# Patient Record
Sex: Female | Born: 1935 | Race: White | Hispanic: No | Marital: Married | Smoking: Never smoker
Health system: Southern US, Community
[De-identification: ages and names within clinical notes are randomized; demographics above are authoritative.]

## PROBLEM LIST (undated history)

## (undated) DIAGNOSIS — E039 Hypothyroidism, unspecified: Secondary | ICD-10-CM

## (undated) DIAGNOSIS — R42 Dizziness and giddiness: Secondary | ICD-10-CM

## (undated) DIAGNOSIS — G4730 Sleep apnea, unspecified: Secondary | ICD-10-CM

## (undated) DIAGNOSIS — M1990 Unspecified osteoarthritis, unspecified site: Secondary | ICD-10-CM

## (undated) DIAGNOSIS — Z923 Personal history of irradiation: Secondary | ICD-10-CM

## (undated) DIAGNOSIS — C50919 Malignant neoplasm of unspecified site of unspecified female breast: Secondary | ICD-10-CM

## (undated) DIAGNOSIS — K219 Gastro-esophageal reflux disease without esophagitis: Secondary | ICD-10-CM

## (undated) DIAGNOSIS — I10 Essential (primary) hypertension: Secondary | ICD-10-CM

## (undated) HISTORY — PX: BREAST EXCISIONAL BIOPSY: SUR124

## (undated) HISTORY — PX: COLONOSCOPY: SHX174

## (undated) HISTORY — PX: APPENDECTOMY: SHX54

## (undated) HISTORY — PX: CARDIAC CATHETERIZATION: SHX172

## (undated) HISTORY — PX: CATARACT EXTRACTION W/ INTRAOCULAR LENS  IMPLANT, BILATERAL: SHX1307

## (undated) HISTORY — PX: TONSILLECTOMY: SUR1361

## (undated) HISTORY — PX: ABDOMINAL HYSTERECTOMY: SHX81

## (undated) HISTORY — PX: JOINT REPLACEMENT: SHX530

---

## 1999-01-13 ENCOUNTER — Emergency Department (HOSPITAL_COMMUNITY): Admission: EM | Admit: 1999-01-13 | Discharge: 1999-01-13 | Payer: Self-pay | Admitting: Emergency Medicine

## 1999-01-13 ENCOUNTER — Encounter: Payer: Self-pay | Admitting: General Surgery

## 1999-01-15 ENCOUNTER — Encounter: Payer: Self-pay | Admitting: General Surgery

## 1999-01-15 ENCOUNTER — Emergency Department (HOSPITAL_COMMUNITY): Admission: EM | Admit: 1999-01-15 | Discharge: 1999-01-15 | Payer: Self-pay | Admitting: Emergency Medicine

## 2006-10-29 ENCOUNTER — Ambulatory Visit: Payer: Self-pay | Admitting: Unknown Physician Specialty

## 2010-01-01 DIAGNOSIS — C50919 Malignant neoplasm of unspecified site of unspecified female breast: Secondary | ICD-10-CM

## 2010-01-01 HISTORY — PX: BREAST LUMPECTOMY: SHX2

## 2010-01-01 HISTORY — PX: BREAST LUMPECTOMY WITH AXILLARY LYMPH NODE BIOPSY: SHX5593

## 2010-01-01 HISTORY — PX: BREAST BIOPSY: SHX20

## 2010-01-01 HISTORY — PX: BREAST SURGERY: SHX581

## 2010-01-01 HISTORY — DX: Malignant neoplasm of unspecified site of unspecified female breast: C50.919

## 2010-12-18 ENCOUNTER — Ambulatory Visit: Payer: Self-pay | Admitting: Radiation Oncology

## 2011-01-05 ENCOUNTER — Ambulatory Visit: Payer: Self-pay | Admitting: Radiation Oncology

## 2011-01-31 LAB — CBC CANCER CENTER
Basophil %: 1.1 %
Eosinophil %: 2.2 %
HGB: 12.9 g/dL (ref 12.0–16.0)
MCHC: 34 g/dL (ref 32.0–36.0)
Monocyte #: 0.6 x10 3/mm (ref 0.0–0.7)
Monocyte %: 7 %
Neutrophil %: 67.7 %
RBC: 4.34 10*6/uL (ref 3.80–5.20)

## 2011-02-02 ENCOUNTER — Ambulatory Visit: Payer: Self-pay | Admitting: Radiation Oncology

## 2011-02-28 LAB — CBC CANCER CENTER
Basophil %: 0.3 %
Eosinophil %: 2 %
MCH: 29.5 pg (ref 26.0–34.0)
MCV: 87 fL (ref 80–100)
Monocyte #: 0.7 x10 3/mm (ref 0.0–0.7)
Neutrophil #: 5.9 x10 3/mm (ref 1.4–6.5)
Neutrophil %: 76.6 %

## 2011-03-02 ENCOUNTER — Ambulatory Visit: Payer: Self-pay | Admitting: Radiation Oncology

## 2011-04-02 ENCOUNTER — Ambulatory Visit: Payer: Self-pay | Admitting: Radiation Oncology

## 2011-05-02 ENCOUNTER — Ambulatory Visit: Payer: Self-pay | Admitting: Radiation Oncology

## 2011-07-17 ENCOUNTER — Ambulatory Visit: Payer: Self-pay | Admitting: Oncology

## 2011-07-17 LAB — COMPREHENSIVE METABOLIC PANEL
Albumin: 3.7 g/dL (ref 3.4–5.0)
Anion Gap: 12 (ref 7–16)
Bilirubin,Total: 0.5 mg/dL (ref 0.2–1.0)
Chloride: 97 mmol/L — ABNORMAL LOW (ref 98–107)
EGFR (African American): >60
Glucose: 96 mg/dL (ref 65–99)
Osmolality: 273 (ref 275–301)
Potassium: 4.1 mmol/L (ref 3.5–5.1)
SGOT(AST): 19 U/L (ref 15–37)
Total Protein: 7.5 g/dL (ref 6.4–8.2)

## 2011-07-17 LAB — CBC CANCER CENTER
Basophil %: 0.5 %
HCT: 39.3 % (ref 35.0–47.0)
HGB: 13.1 g/dL (ref 12.0–16.0)
Lymphocyte #: 1 x10 3/mm (ref 1.0–3.6)
Lymphocyte %: 11.5 %
MCHC: 33.3 g/dL (ref 32.0–36.0)
Monocyte #: 0.9 x10 3/mm (ref 0.2–0.9)
Monocyte %: 9.9 %
Neutrophil %: 76.7 %
RDW: 15.3 % — ABNORMAL HIGH (ref 11.5–14.5)
WBC: 8.8 x10 3/mm (ref 3.6–11.0)

## 2011-08-02 ENCOUNTER — Ambulatory Visit: Payer: Self-pay | Admitting: Oncology

## 2011-09-27 ENCOUNTER — Ambulatory Visit: Payer: Self-pay | Admitting: Oncology

## 2011-10-02 ENCOUNTER — Ambulatory Visit: Payer: Self-pay | Admitting: Oncology

## 2012-01-02 ENCOUNTER — Ambulatory Visit: Payer: Self-pay | Admitting: Oncology

## 2012-01-14 ENCOUNTER — Encounter: Payer: Self-pay | Admitting: Unknown Physician Specialty

## 2012-01-15 LAB — COMPREHENSIVE METABOLIC PANEL
Albumin: 3.8 g/dL (ref 3.4–5.0)
Alkaline Phosphatase: 86 U/L (ref 50–136)
Anion Gap: 11 (ref 7–16)
Bilirubin,Total: 0.5 mg/dL (ref 0.2–1.0)
Calcium, Total: 9 mg/dL (ref 8.5–10.1)
Chloride: 96 mmol/L — ABNORMAL LOW (ref 98–107)
Co2: 25 mmol/L (ref 21–32)
Creatinine: 0.82 mg/dL (ref 0.60–1.30)
Glucose: 74 mg/dL (ref 65–99)
Osmolality: 264 (ref 275–301)
Potassium: 3.8 mmol/L (ref 3.5–5.1)
SGOT(AST): 19 U/L (ref 15–37)
SGPT (ALT): 25 U/L (ref 12–78)
Sodium: 132 mmol/L — ABNORMAL LOW (ref 136–145)

## 2012-01-15 LAB — CBC CANCER CENTER
Basophil %: 0.8 %
Eosinophil %: 2.3 %
HCT: 40.4 % (ref 35.0–47.0)
Lymphocyte #: 1.4 x10 3/mm (ref 1.0–3.6)
MCHC: 33.8 g/dL (ref 32.0–36.0)
Monocyte %: 10.5 %
Neutrophil #: 4.9 x10 3/mm (ref 1.4–6.5)
Platelet: 176 x10 3/mm (ref 150–440)
WBC: 7.4 x10 3/mm (ref 3.6–11.0)

## 2012-02-02 ENCOUNTER — Encounter: Payer: Self-pay | Admitting: Unknown Physician Specialty

## 2012-02-02 ENCOUNTER — Ambulatory Visit: Payer: Self-pay | Admitting: Oncology

## 2012-02-27 ENCOUNTER — Ambulatory Visit: Payer: Self-pay | Admitting: Neurology

## 2012-02-28 ENCOUNTER — Ambulatory Visit: Payer: Self-pay | Admitting: Neurology

## 2012-03-06 ENCOUNTER — Ambulatory Visit: Payer: Self-pay | Admitting: Neurology

## 2012-04-09 ENCOUNTER — Ambulatory Visit: Payer: Self-pay | Admitting: Oncology

## 2012-05-01 ENCOUNTER — Ambulatory Visit: Payer: Self-pay | Admitting: Oncology

## 2012-07-01 ENCOUNTER — Ambulatory Visit: Payer: Self-pay | Admitting: Oncology

## 2012-07-09 ENCOUNTER — Other Ambulatory Visit: Payer: Self-pay | Admitting: Orthopedic Surgery

## 2012-07-14 ENCOUNTER — Encounter (HOSPITAL_COMMUNITY): Payer: Self-pay | Admitting: Respiratory Therapy

## 2012-07-16 ENCOUNTER — Encounter (HOSPITAL_COMMUNITY): Payer: Self-pay

## 2012-07-16 ENCOUNTER — Encounter (HOSPITAL_COMMUNITY)
Admission: RE | Admit: 2012-07-16 | Discharge: 2012-07-16 | Disposition: A | Discharge status: Home / Self Care | Payer: Medicare Other | Source: Ambulatory Visit | Attending: Orthopedic Surgery | Admitting: Orthopedic Surgery

## 2012-07-16 DIAGNOSIS — Z01812 Encounter for preprocedural laboratory examination: Secondary | ICD-10-CM | POA: Insufficient documentation

## 2012-07-16 DIAGNOSIS — Z01810 Encounter for preprocedural cardiovascular examination: Secondary | ICD-10-CM | POA: Insufficient documentation

## 2012-07-16 DIAGNOSIS — Z01818 Encounter for other preprocedural examination: Secondary | ICD-10-CM | POA: Insufficient documentation

## 2012-07-16 DIAGNOSIS — Z01811 Encounter for preprocedural respiratory examination: Secondary | ICD-10-CM | POA: Insufficient documentation

## 2012-07-16 HISTORY — DX: Hypothyroidism, unspecified: E03.9

## 2012-07-16 HISTORY — DX: Sleep apnea, unspecified: G47.30

## 2012-07-16 HISTORY — DX: Essential (primary) hypertension: I10

## 2012-07-16 HISTORY — DX: Gastro-esophageal reflux disease without esophagitis: K21.9

## 2012-07-16 HISTORY — DX: Unspecified osteoarthritis, unspecified site: M19.90

## 2012-07-16 LAB — CBC WITH DIFFERENTIAL/PLATELET
Eosinophils Absolute: 0.2 10*3/uL (ref 0.0–0.7)
Lymphs Abs: 1.5 10*3/uL (ref 0.7–4.0)
MCH: 29.5 pg (ref 26.0–34.0)
Neutro Abs: 4.7 10*3/uL (ref 1.7–7.7)
Neutrophils Relative %: 67 % (ref 43–77)
Platelets: 174 10*3/uL (ref 150–400)
RBC: 4.48 MIL/uL (ref 3.87–5.11)
WBC: 7 10*3/uL (ref 4.0–10.5)

## 2012-07-16 LAB — URINALYSIS, ROUTINE W REFLEX MICROSCOPIC
Bilirubin Urine: NEGATIVE
Glucose, UA: NEGATIVE mg/dL
Ketones, ur: NEGATIVE mg/dL
Nitrite: POSITIVE — AB
pH: 7 (ref 5.0–8.0)

## 2012-07-16 LAB — BASIC METABOLIC PANEL
Calcium: 10 mg/dL (ref 8.4–10.5)
GFR calc Af Amer: 63 mL/min — ABNORMAL LOW (ref >90)
GFR calc non Af Amer: 54 mL/min — ABNORMAL LOW (ref >90)
Sodium: 136 mEq/L (ref 135–145)

## 2012-07-16 LAB — APTT: aPTT: 25 seconds (ref 24–37)

## 2012-07-16 LAB — PROTIME-INR
INR: 1.1 (ref 0.00–1.49)
Prothrombin Time: 14 seconds (ref 11.6–15.2)

## 2012-07-16 LAB — SURGICAL PCR SCREEN
MRSA, PCR: NEGATIVE
Staphylococcus aureus: NEGATIVE

## 2012-07-16 LAB — TYPE AND SCREEN

## 2012-07-16 LAB — URINE MICROSCOPIC-ADD ON

## 2012-07-16 NOTE — Pre-Procedure Instructions (Signed)
Lori Wiggins  07/16/2012   Your procedure is scheduled on: Monday, July 22, 2102  Report to [facility] at  9:00 AM.  Call this number if you have problems the morning of surgery: [phone]   Remember:   Do not eat food or drink liquids after midnight.   Take these medicines the morning of surgery with A SIP OF WATER: levothyroxine (SYNTHROID, LEVOTHROID) 50 MCG tablet   Stop taking Aspirin and herbal medications (Omega-3 Fatty Acids (OMEGA-3 PO), Grape Seed 100 MG CAPS, Coenzyme Q10  (CO  Q-10) 100 MG CAPS  Do not take any NSAIDs ie: Ibuprofen, Advil, Naproxen or any medication containing Aspirin.   Do not wear jewelry, make-up or nail polish.  Do not wear lotions, powders, or perfumes. You may wear deodorant.  Do not shave 48 hours prior to surgery.  Do not bring valuables to the hospital.  [facility] is not responsible for any belongings or valuables.  Contacts, dentures or bridgework may not be worn into surgery.  Leave suitcase in the car. After surgery it may be brought to your room.  For patients admitted to the hospital, checkout time is 11:00 AM the day of discharge.   Patients discharged the day of surgery will not be allowed to drive home.  Name and phone number of your driver:   Special Instructions: Shower using CHG 2 nights before surgery and the night before surgery.  If you shower the day of surgery use CHG.  Use special wash - you have one bottle of CHG for all showers.  You should use approximately 1/3 of the bottle for each shower.   Please read over the following fact sheets that you were given: Pain Booklet, Coughing and Deep Breathing, Blood Transfusion Information, Total Joint Packet, MRSA Information and Surgical Site Infection Prevention

## 2012-07-17 LAB — URINE CULTURE: Colony Count: >=100000

## 2012-07-17 NOTE — Progress Notes (Signed)
Anesthesia Chart Review:  Patient is a 79 year old female scheduled for left TKA on 07/21/12.  History includes non-smoker, HTN, OSA with CPAP use, GERD, hypothyroidism, left breast cancer s/p lumpectomy '12, arthritis.  EKG on 07/16/12 showed NSR, possible LAE. By records, she did have a cardiac cath > 10 years ago, but there is no reported CAD/MI/CHF history.  CXR on 07/16/12 showed: 1. No acute cardiopulmonary disease.  2. Mild interstitial coarsening appears chronic.  Preoperative labs noted.  Urine culture showed > 100,000 colonies of E. Coli.  Urine culture results called to [facility] at Dr. Wadie Lessen office.  Will defer treatment recommendations to Dr. Turner Daniels.  She will be evaluated by her assigned anesthesiologist on the day of surgery, but if no acute changes then I would anticipate that she could proceed as planned.  Velna Ochs [facility]/Anesthesiology Phone [phone] 07/17/2012 3:38 PM

## 2012-07-18 NOTE — Progress Notes (Addendum)
I called the number listed and was told that I have the wrong number.  [phone].  I called again at 1935 at the same number and spoke with the patient.  Instructed her to arrive at 0800 for 1000 surgery.

## 2012-07-18 NOTE — H&P (Signed)
Lori Wiggins is an 78 y.o. female.   Chief Complaint: End Stage arthritis of Left Knee  HPI: Patient is seen in consultation from Dr. Charlett Blake for end-stage arthritis of the left knee.  She has failed conservative treatment with anti-inflammatory medicines, cortisone injections, and most recently a series of Synvisc injections.  She is 79 years old, very healthy and very active.  Over the last 6 months.  The pain is gotten progressively worse.  She must hold onto a shopping cart whenever she shops she cannot walk more than 1 block, the pain does not wake her at night at this time.  Although she has not fallen down.  She is concerned about some instability when she walks.  She denies any prior history of stroke, heart attack, bleeding ulcer or out-of-control diabetes or blood pressure problems.  Past Medical History  Diagnosis Date  . Hypertension   . Hypothyroidism   . Sleep apnea     uses CPAP  . GERD (gastroesophageal reflux disease)   . Arthritis   . Cancer     left  breast    Past Surgical History  Procedure Laterality Date  . Cardiac catheterization      10 years ago  . Breast surgery Left 2012    Lumpectomy  . Tonsillectomy    . Appendectomy    . Abdominal hysterectomy      No family history on file. Social History:  reports that she has never smoked. She has never used smokeless tobacco. She reports that  drinks alcohol. She reports that she does not use illicit drugs.  Allergies:  Allergies  Allergen Reactions  . Nsaids Rash    Pt. does not want to take at all.    No prescriptions prior to admission    No results found for this or any previous visit (from the past 48 hour(s)). No results found.  Review of Systems  Constitutional: Negative.   HENT: Negative.   Eyes: Negative.   Respiratory: Negative.   Cardiovascular: Negative.   Gastrointestinal: Negative.   Genitourinary: Negative.   Musculoskeletal: Positive for joint pain.  Skin: Negative.    Neurological:       Balance problems  Endo/Heme/Allergies: Negative.   Psychiatric/Behavioral: Negative.     There were no vitals taken for this visit. Physical Exam  Constitutional: She is oriented to person, place, and time. She appears well-developed and well-nourished.  HENT:  Head: Normocephalic and atraumatic.  Eyes: EOM are normal. Pupils are equal, round, and reactive to light.  Neck: Normal range of motion. Neck supple.  Cardiovascular: Intact distal pulses.   Respiratory: Effort normal and breath sounds normal.  Musculoskeletal: She exhibits tenderness (Left knee pain).  Neurological: She is alert and oriented to person, place, and time. She has normal reflexes.  Skin: Skin is warm and dry.  Psychiatric: She has a normal mood and affect. Her behavior is normal. Judgment and thought content normal.     Assessment/Plan  Assess: In stage arthritis of the left knee.  Having failed extensive and appropriate conservative measures over the last 6 months.  Plan: Risks and benefits of knee replacement were discussed at length with the patient, models were brought into the room and all questions were answered to the patient and her husband.  At age 22.  She will probably go to a rehabilitation center for a week or 2 after inpatient stay.  She concurs with this plan.  Pt was started on Cipro 500 BID x 7days  prior to surgery for a positive U/A.    Gean Birchwood J 07/18/2012, 11:00 AM

## 2012-07-18 NOTE — Progress Notes (Signed)
Patient needs to come in at 0800 instead of 0900 on Monday morning but no answer or answering machine available. Will attempt to call again later today.

## 2012-07-20 MED ORDER — CEFAZOLIN SODIUM-DEXTROSE 2-3 GM-% IV SOLR
2.0000 g | INTRAVENOUS | Status: AC
  Administered 2012-07-21: 2 g via INTRAVENOUS
  Filled 2012-07-20: qty 50

## 2012-07-21 ENCOUNTER — Encounter (HOSPITAL_COMMUNITY): Payer: Self-pay | Admitting: Vascular Surgery

## 2012-07-21 ENCOUNTER — Encounter (HOSPITAL_COMMUNITY): Payer: Self-pay | Admitting: Anesthesiology

## 2012-07-21 ENCOUNTER — Inpatient Hospital Stay (HOSPITAL_COMMUNITY): Payer: Medicare Other | Admitting: Anesthesiology

## 2012-07-21 ENCOUNTER — Inpatient Hospital Stay (HOSPITAL_COMMUNITY)
Admission: RE | Admit: 2012-07-21 | Discharge: 2012-07-30 | DRG: 470 | Disposition: A | Discharge status: Skilled Nursing Facility | Payer: Medicare Other | Source: Ambulatory Visit | Attending: Orthopedic Surgery | Admitting: Orthopedic Surgery

## 2012-07-21 ENCOUNTER — Encounter (HOSPITAL_COMMUNITY)
Admission: RE | Disposition: A | Discharge status: Skilled Nursing Facility | Payer: Self-pay | Source: Ambulatory Visit | Attending: Orthopedic Surgery

## 2012-07-21 DIAGNOSIS — K9189 Other postprocedural complications and disorders of digestive system: Secondary | ICD-10-CM

## 2012-07-21 DIAGNOSIS — K560 Paralytic ileus: Secondary | ICD-10-CM | POA: Diagnosis not present

## 2012-07-21 DIAGNOSIS — D649 Anemia, unspecified: Secondary | ICD-10-CM | POA: Diagnosis not present

## 2012-07-21 DIAGNOSIS — K929 Disease of digestive system, unspecified: Secondary | ICD-10-CM | POA: Diagnosis not present

## 2012-07-21 DIAGNOSIS — E871 Hypo-osmolality and hyponatremia: Secondary | ICD-10-CM

## 2012-07-21 DIAGNOSIS — A498 Other bacterial infections of unspecified site: Secondary | ICD-10-CM | POA: Diagnosis present

## 2012-07-21 DIAGNOSIS — R339 Retention of urine, unspecified: Secondary | ICD-10-CM | POA: Diagnosis not present

## 2012-07-21 DIAGNOSIS — E039 Hypothyroidism, unspecified: Secondary | ICD-10-CM | POA: Diagnosis present

## 2012-07-21 DIAGNOSIS — K219 Gastro-esophageal reflux disease without esophagitis: Secondary | ICD-10-CM | POA: Diagnosis present

## 2012-07-21 DIAGNOSIS — G4733 Obstructive sleep apnea (adult) (pediatric): Secondary | ICD-10-CM | POA: Diagnosis present

## 2012-07-21 DIAGNOSIS — Z79899 Other long term (current) drug therapy: Secondary | ICD-10-CM

## 2012-07-21 DIAGNOSIS — M1710 Unilateral primary osteoarthritis, unspecified knee: Principal | ICD-10-CM | POA: Diagnosis present

## 2012-07-21 DIAGNOSIS — Z882 Allergy status to sulfonamides status: Secondary | ICD-10-CM

## 2012-07-21 DIAGNOSIS — Z9089 Acquired absence of other organs: Secondary | ICD-10-CM

## 2012-07-21 DIAGNOSIS — M1712 Unilateral primary osteoarthritis, left knee: Secondary | ICD-10-CM | POA: Diagnosis present

## 2012-07-21 DIAGNOSIS — Z7901 Long term (current) use of anticoagulants: Secondary | ICD-10-CM

## 2012-07-21 DIAGNOSIS — Z853 Personal history of malignant neoplasm of breast: Secondary | ICD-10-CM

## 2012-07-21 DIAGNOSIS — Y9210 Unspecified residential institution as the place of occurrence of the external cause: Secondary | ICD-10-CM | POA: Diagnosis not present

## 2012-07-21 DIAGNOSIS — Y831 Surgical operation with implant of artificial internal device as the cause of abnormal reaction of the patient, or of later complication, without mention of misadventure at the time of the procedure: Secondary | ICD-10-CM | POA: Diagnosis not present

## 2012-07-21 DIAGNOSIS — E876 Hypokalemia: Secondary | ICD-10-CM | POA: Diagnosis not present

## 2012-07-21 DIAGNOSIS — K567 Ileus, unspecified: Secondary | ICD-10-CM | POA: Diagnosis not present

## 2012-07-21 DIAGNOSIS — N390 Urinary tract infection, site not specified: Secondary | ICD-10-CM | POA: Diagnosis present

## 2012-07-21 DIAGNOSIS — I10 Essential (primary) hypertension: Secondary | ICD-10-CM | POA: Diagnosis present

## 2012-07-21 DIAGNOSIS — Z886 Allergy status to analgesic agent status: Secondary | ICD-10-CM

## 2012-07-21 DIAGNOSIS — F29 Unspecified psychosis not due to a substance or known physiological condition: Secondary | ICD-10-CM | POA: Diagnosis not present

## 2012-07-21 DIAGNOSIS — B9620 Unspecified Escherichia coli [E. coli] as the cause of diseases classified elsewhere: Secondary | ICD-10-CM

## 2012-07-21 HISTORY — PX: TOTAL KNEE ARTHROPLASTY: SHX125

## 2012-07-21 HISTORY — DX: Malignant neoplasm of unspecified site of unspecified female breast: C50.919

## 2012-07-21 LAB — CBC
HCT: 32.4 % — ABNORMAL LOW (ref 36.0–46.0)
Hemoglobin: 11.3 g/dL — ABNORMAL LOW (ref 12.0–15.0)
MCV: 83.7 fL (ref 78.0–100.0)
RBC: 3.87 MIL/uL (ref 3.87–5.11)
WBC: 14.8 10*3/uL — ABNORMAL HIGH (ref 4.0–10.5)

## 2012-07-21 LAB — CREATININE, SERUM: GFR calc Af Amer: >90 mL/min (ref >90)

## 2012-07-21 SURGERY — ARTHROPLASTY, KNEE, TOTAL
Anesthesia: General | Site: Knee | Laterality: Left | Wound class: Clean

## 2012-07-21 MED ORDER — ONDANSETRON HCL 4 MG/2ML IJ SOLN
4.0000 mg | Freq: Four times a day (QID) | INTRAMUSCULAR | Status: DC | PRN
  Administered 2012-07-24 – 2012-07-26 (×5): 4 mg via INTRAVENOUS
  Filled 2012-07-21 (×5): qty 2

## 2012-07-21 MED ORDER — ALUM & MAG HYDROXIDE-SIMETH 200-200-20 MG/5ML PO SUSP: 30.0000 mL | ORAL | Status: DC | PRN

## 2012-07-21 MED ORDER — ZOLPIDEM TARTRATE 5 MG PO TABS: 5.0000 mg | ORAL_TABLET | Freq: Every evening | ORAL | Status: DC | PRN

## 2012-07-21 MED ORDER — ADULT MULTIVITAMIN W/MINERALS CH
1.0000 | ORAL_TABLET | Freq: Every day | ORAL | Status: DC
  Administered 2012-07-22 – 2012-07-23 (×2): 1 via ORAL
  Filled 2012-07-21 (×3): qty 1

## 2012-07-21 MED ORDER — PROPOFOL 10 MG/ML IV BOLUS
INTRAVENOUS | Status: DC | PRN
  Administered 2012-07-21: 20 mg via INTRAVENOUS
  Administered 2012-07-21: 100 mg via INTRAVENOUS

## 2012-07-21 MED ORDER — IRBESARTAN 150 MG PO TABS
150.0000 mg | ORAL_TABLET | Freq: Every day | ORAL | Status: DC
  Administered 2012-07-22 – 2012-07-24 (×3): 150 mg via ORAL
  Filled 2012-07-21 (×4): qty 1

## 2012-07-21 MED ORDER — ONDANSETRON HCL 4 MG PO TABS: 4.0000 mg | ORAL_TABLET | Freq: Four times a day (QID) | ORAL | Status: DC | PRN

## 2012-07-21 MED ORDER — METOCLOPRAMIDE HCL 10 MG PO TABS: 5.0000 mg | ORAL_TABLET | Freq: Three times a day (TID) | ORAL | Status: DC | PRN

## 2012-07-21 MED ORDER — WARFARIN VIDEO: Freq: Once | Status: DC

## 2012-07-21 MED ORDER — ONDANSETRON HCL 4 MG/2ML IJ SOLN
INTRAMUSCULAR | Status: DC | PRN
  Administered 2012-07-21: 4 mg via INTRAVENOUS

## 2012-07-21 MED ORDER — PHENOL 1.4 % MT LIQD
1.0000 | OROMUCOSAL | Status: DC | PRN
  Administered 2012-07-27: 1 via OROMUCOSAL
  Filled 2012-07-21: qty 177

## 2012-07-21 MED ORDER — FENTANYL CITRATE 0.05 MG/ML IJ SOLN
INTRAMUSCULAR | Status: DC | PRN
Start: 2012-07-21 — End: 2012-07-21
  Administered 2012-07-21 (×2): 50 ug via INTRAVENOUS
  Administered 2012-07-21: 100 ug via INTRAVENOUS
  Administered 2012-07-21: 50 ug via INTRAVENOUS

## 2012-07-21 MED ORDER — SODIUM CHLORIDE 0.9 % IR SOLN
Status: DC | PRN
  Administered 2012-07-21: 3000 mL

## 2012-07-21 MED ORDER — EPHEDRINE SULFATE 50 MG/ML IJ SOLN
INTRAMUSCULAR | Status: DC | PRN
  Administered 2012-07-21: 10 mg via INTRAVENOUS

## 2012-07-21 MED ORDER — KCL IN DEXTROSE-NACL 20-5-0.45 MEQ/L-%-% IV SOLN
INTRAVENOUS | Status: DC
  Administered 2012-07-21: 125 mL/h via INTRAVENOUS
  Administered 2012-07-22 – 2012-07-24 (×5): via INTRAVENOUS
  Filled 2012-07-21 (×20): qty 1000

## 2012-07-21 MED ORDER — DIPHENHYDRAMINE HCL 12.5 MG/5ML PO ELIX: 12.5000 mg | ORAL_SOLUTION | ORAL | Status: DC | PRN

## 2012-07-21 MED ORDER — CHLORHEXIDINE GLUCONATE 4 % EX LIQD: 60.0000 mL | Freq: Once | CUTANEOUS | Status: DC

## 2012-07-21 MED ORDER — SODIUM CHLORIDE 0.9 % IJ SOLN
INTRAMUSCULAR | Status: DC | PRN
  Administered 2012-07-21: 11:00:00

## 2012-07-21 MED ORDER — COUMADIN BOOK
Freq: Once | Status: AC
  Administered 2012-07-21: 15:00:00
  Filled 2012-07-21: qty 1

## 2012-07-21 MED ORDER — BUPIVACAINE HCL (PF) 0.25 % IJ SOLN
INTRAMUSCULAR | Status: AC
  Filled 2012-07-21: qty 30

## 2012-07-21 MED ORDER — METHOCARBAMOL 100 MG/ML IJ SOLN
500.0000 mg | Freq: Four times a day (QID) | INTRAMUSCULAR | Status: DC | PRN
  Filled 2012-07-21: qty 5

## 2012-07-21 MED ORDER — METHOCARBAMOL 500 MG PO TABS
500.0000 mg | ORAL_TABLET | Freq: Four times a day (QID) | ORAL | Status: DC | PRN
  Administered 2012-07-21 – 2012-07-23 (×3): 500 mg via ORAL
  Filled 2012-07-21 (×3): qty 1

## 2012-07-21 MED ORDER — CEFUROXIME SODIUM 1.5 G IJ SOLR
INTRAMUSCULAR | Status: DC | PRN
  Administered 2012-07-21: 1.5 g

## 2012-07-21 MED ORDER — KCL IN DEXTROSE-NACL 20-5-0.45 MEQ/L-%-% IV SOLN
INTRAVENOUS | Status: AC
  Filled 2012-07-21: qty 1000

## 2012-07-21 MED ORDER — METOCLOPRAMIDE HCL 5 MG/ML IJ SOLN
5.0000 mg | Freq: Three times a day (TID) | INTRAMUSCULAR | Status: DC | PRN
  Administered 2012-07-24: 10 mg via INTRAVENOUS
  Filled 2012-07-21: qty 2

## 2012-07-21 MED ORDER — ENOXAPARIN SODIUM 30 MG/0.3ML ~~LOC~~ SOLN
30.0000 mg | Freq: Two times a day (BID) | SUBCUTANEOUS | Status: DC
  Administered 2012-07-22 – 2012-07-23 (×3): 30 mg via SUBCUTANEOUS
  Filled 2012-07-21 (×5): qty 0.3

## 2012-07-21 MED ORDER — DEXTROSE-NACL 5-0.45 % IV SOLN: INTRAVENOUS | Status: DC

## 2012-07-21 MED ORDER — ACETAMINOPHEN 325 MG PO TABS
650.0000 mg | ORAL_TABLET | Freq: Four times a day (QID) | ORAL | Status: DC | PRN
  Administered 2012-07-21: 650 mg via ORAL
  Filled 2012-07-21: qty 2

## 2012-07-21 MED ORDER — LEVOTHYROXINE SODIUM 50 MCG PO TABS
50.0000 ug | ORAL_TABLET | Freq: Every day | ORAL | Status: DC
  Administered 2012-07-22 – 2012-07-24 (×3): 50 ug via ORAL
  Filled 2012-07-21 (×5): qty 1

## 2012-07-21 MED ORDER — SODIUM CHLORIDE 0.9 % IR SOLN
Status: DC | PRN
  Administered 2012-07-21: 1

## 2012-07-21 MED ORDER — LIDOCAINE HCL (CARDIAC) 20 MG/ML IV SOLN
INTRAVENOUS | Status: DC | PRN
  Administered 2012-07-21: 55 mg via INTRAVENOUS

## 2012-07-21 MED ORDER — HYDROCHLOROTHIAZIDE 25 MG PO TABS
25.0000 mg | ORAL_TABLET | Freq: Every day | ORAL | Status: DC
  Administered 2012-07-22 – 2012-07-24 (×3): 25 mg via ORAL
  Filled 2012-07-21 (×4): qty 1

## 2012-07-21 MED ORDER — ACETAMINOPHEN 650 MG RE SUPP: 650.0000 mg | Freq: Four times a day (QID) | RECTAL | Status: DC | PRN

## 2012-07-21 MED ORDER — CIPROFLOXACIN HCL 500 MG PO TABS
500.0000 mg | ORAL_TABLET | Freq: Two times a day (BID) | ORAL | Status: DC
  Administered 2012-07-21 – 2012-07-23 (×4): 500 mg via ORAL
  Filled 2012-07-21 (×5): qty 1

## 2012-07-21 MED ORDER — FENTANYL CITRATE 0.05 MG/ML IJ SOLN
INTRAMUSCULAR | Status: AC
  Filled 2012-07-21: qty 2

## 2012-07-21 MED ORDER — BUPIVACAINE HCL (PF) 0.25 % IJ SOLN
INTRAMUSCULAR | Status: DC | PRN
  Administered 2012-07-21: 30 mL

## 2012-07-21 MED ORDER — ONDANSETRON HCL 4 MG/2ML IJ SOLN: 4.0000 mg | Freq: Once | INTRAMUSCULAR | Status: DC | PRN

## 2012-07-21 MED ORDER — MENTHOL 3 MG MT LOZG: 1.0000 | LOZENGE | OROMUCOSAL | Status: DC | PRN

## 2012-07-21 MED ORDER — WARFARIN SODIUM 4 MG PO TABS
4.0000 mg | ORAL_TABLET | Freq: Every day | ORAL | Status: DC
  Administered 2012-07-21: 4 mg via ORAL
  Filled 2012-07-21 (×2): qty 1

## 2012-07-21 MED ORDER — WARFARIN - PHARMACIST DOSING INPATIENT: Freq: Every day | Status: DC

## 2012-07-21 MED ORDER — VALSARTAN-HYDROCHLOROTHIAZIDE 160-25 MG PO TABS: 1.0000 | ORAL_TABLET | Freq: Every day | ORAL | Status: DC

## 2012-07-21 MED ORDER — OXYCODONE HCL 5 MG PO TABS
5.0000 mg | ORAL_TABLET | ORAL | Status: DC | PRN
  Administered 2012-07-21: 5 mg via ORAL
  Administered 2012-07-21 – 2012-07-22 (×3): 10 mg via ORAL
  Administered 2012-07-22 – 2012-07-23 (×3): 5 mg via ORAL
  Filled 2012-07-21 (×2): qty 1
  Filled 2012-07-21: qty 2
  Filled 2012-07-21: qty 1
  Filled 2012-07-21 (×2): qty 2
  Filled 2012-07-21: qty 1
  Filled 2012-07-21: qty 2

## 2012-07-21 MED ORDER — HYDROMORPHONE HCL PF 1 MG/ML IJ SOLN: 1.0000 mg | INTRAMUSCULAR | Status: DC | PRN

## 2012-07-21 MED ORDER — BUPIVACAINE LIPOSOME 1.3 % IJ SUSP
20.0000 mL | INTRAMUSCULAR | Status: DC
  Filled 2012-07-21: qty 20

## 2012-07-21 MED ORDER — LACTATED RINGERS IV SOLN
INTRAVENOUS | Status: DC | PRN
  Administered 2012-07-21 (×2): via INTRAVENOUS

## 2012-07-21 MED ORDER — METHOCARBAMOL 500 MG PO TABS
ORAL_TABLET | ORAL | Status: AC
  Filled 2012-07-21: qty 27

## 2012-07-21 MED ORDER — FENTANYL CITRATE 0.05 MG/ML IJ SOLN
25.0000 ug | INTRAMUSCULAR | Status: DC | PRN
  Administered 2012-07-21 (×3): 50 ug via INTRAVENOUS

## 2012-07-21 MED ORDER — DOCUSATE SODIUM 100 MG PO CAPS
100.0000 mg | ORAL_CAPSULE | Freq: Two times a day (BID) | ORAL | Status: DC
  Administered 2012-07-21 – 2012-07-24 (×6): 100 mg via ORAL
  Filled 2012-07-21 (×12): qty 1

## 2012-07-21 MED ORDER — CEFUROXIME SODIUM 1.5 G IJ SOLR
INTRAMUSCULAR | Status: AC
  Filled 2012-07-21: qty 1.5

## 2012-07-21 SURGICAL SUPPLY — 56 items
BANDAGE ESMARK 6X9 LF (GAUZE/BANDAGES/DRESSINGS) ×1 IMPLANT
BLADE SAG 18X100X1.27 (BLADE) ×2 IMPLANT
BLADE SAW SGTL 13X75X1.27 (BLADE) ×2 IMPLANT
BLADE SURG ROTATE 9660 (MISCELLANEOUS) IMPLANT
BNDG ELASTIC 6X10 VLCR STRL LF (GAUZE/BANDAGES/DRESSINGS) ×2 IMPLANT
BNDG ESMARK 6X9 LF (GAUZE/BANDAGES/DRESSINGS) ×2
BOWL SMART MIX CTS (DISPOSABLE) ×2 IMPLANT
CAPT RP KNEE ×2 IMPLANT
CEMENT HV SMART SET (Cement) ×4 IMPLANT
CLOTH BEACON ORANGE TIMEOUT ST (SAFETY) ×2 IMPLANT
COVER SURGICAL LIGHT HANDLE (MISCELLANEOUS) ×2 IMPLANT
CUFF TOURNIQUET SINGLE 34IN LL (TOURNIQUET CUFF) ×2 IMPLANT
CUFF TOURNIQUET SINGLE 44IN (TOURNIQUET CUFF) IMPLANT
DRAPE EXTREMITY T 121X128X90 (DRAPE) ×2 IMPLANT
DRAPE U-SHAPE 47X51 STRL (DRAPES) ×2 IMPLANT
DURAPREP 26ML APPLICATOR (WOUND CARE) ×4 IMPLANT
ELECT REM PT RETURN 9FT ADLT (ELECTROSURGICAL) ×2
ELECTRODE REM PT RTRN 9FT ADLT (ELECTROSURGICAL) ×1 IMPLANT
EVACUATOR 1/8 PVC DRAIN (DRAIN) ×2 IMPLANT
GAUZE XEROFORM 1X8 LF (GAUZE/BANDAGES/DRESSINGS) IMPLANT
GAUZE XEROFORM 5X9 LF (GAUZE/BANDAGES/DRESSINGS) ×2 IMPLANT
GLOVE BIO SURGEON STRL SZ7.5 (GLOVE) ×2 IMPLANT
GLOVE BIO SURGEON STRL SZ8.5 (GLOVE) ×2 IMPLANT
GLOVE BIOGEL PI IND STRL 7.0 (GLOVE) ×2 IMPLANT
GLOVE BIOGEL PI IND STRL 8 (GLOVE) ×2 IMPLANT
GLOVE BIOGEL PI INDICATOR 7.0 (GLOVE) ×2
GLOVE BIOGEL PI INDICATOR 8 (GLOVE) ×2
GLOVE SURG SS PI 7.0 STRL IVOR (GLOVE) ×2 IMPLANT
GOWN PREVENTION PLUS XLARGE (GOWN DISPOSABLE) ×2 IMPLANT
GOWN STRL NON-REIN LRG LVL3 (GOWN DISPOSABLE) IMPLANT
GOWN STRL REIN XL XLG (GOWN DISPOSABLE) ×2 IMPLANT
HANDPIECE INTERPULSE COAX TIP (DISPOSABLE) ×1
HOOD PEEL AWAY FACE SHEILD DIS (HOOD) ×4 IMPLANT
KIT BASIN OR (CUSTOM PROCEDURE TRAY) ×2 IMPLANT
KIT ROOM TURNOVER OR (KITS) ×2 IMPLANT
MANIFOLD NEPTUNE II (INSTRUMENTS) ×2 IMPLANT
NS IRRIG 1000ML POUR BTL (IV SOLUTION) ×2 IMPLANT
PACK TOTAL JOINT (CUSTOM PROCEDURE TRAY) ×2 IMPLANT
PAD ARMBOARD 7.5X6 YLW CONV (MISCELLANEOUS) ×4 IMPLANT
PADDING CAST COTTON 6X4 STRL (CAST SUPPLIES) ×2 IMPLANT
SET HNDPC FAN SPRY TIP SCT (DISPOSABLE) ×1 IMPLANT
SPONGE GAUZE 4X4 12PLY (GAUZE/BANDAGES/DRESSINGS) ×2 IMPLANT
STAPLER VISISTAT 35W (STAPLE) ×2 IMPLANT
SUCTION FRAZIER TIP 10 FR DISP (SUCTIONS) ×2 IMPLANT
SUT VIC AB 0 CTX 36 (SUTURE) ×1
SUT VIC AB 0 CTX36XBRD ANTBCTR (SUTURE) ×1 IMPLANT
SUT VIC AB 1 CTX 36 (SUTURE) ×2
SUT VIC AB 1 CTX36XBRD ANBCTR (SUTURE) ×1 IMPLANT
SUT VIC AB 2-0 CT1 27 (SUTURE) ×1
SUT VIC AB 2-0 CT1 TAPERPNT 27 (SUTURE) ×1 IMPLANT
TAPE HY-TAPE 1X5Y PINK NS LF (GAUZE/BANDAGES/DRESSINGS) ×2 IMPLANT
TOWEL OR 17X24 6PK STRL BLUE (TOWEL DISPOSABLE) ×2 IMPLANT
TOWEL OR 17X26 10 PK STRL BLUE (TOWEL DISPOSABLE) ×2 IMPLANT
TRAY FOLEY CATH 16FR SILVER (SET/KITS/TRAYS/PACK) ×2 IMPLANT
TRAY FOLEY CATH 16FRSI W/METER (SET/KITS/TRAYS/PACK) IMPLANT
WATER STERILE IRR 1000ML POUR (IV SOLUTION) ×2 IMPLANT

## 2012-07-21 NOTE — Transfer of Care (Signed)
Immediate Anesthesia Transfer of Care Note  Patient: Lori Wiggins  Procedure(s) Performed: Procedure(s): TOTAL KNEE ARTHROPLASTY (Left)  Patient Location: PACU  Anesthesia Type:General  Level of Consciousness: awake, alert  and oriented  Airway & Oxygen Therapy: Patient Spontanous Breathing and Patient connected to nasal cannula oxygen  Post-op Assessment: Report given to PACU RN, Post -op Vital signs reviewed and stable and Patient moving all extremities X 4  Post vital signs: Reviewed and stable  Complications: No apparent anesthesia complications

## 2012-07-21 NOTE — Interval H&P Note (Signed)
History and Physical Interval Note:  07/21/2012 9:17 AM  Lori Wiggins  has presented today for surgery, with the diagnosis of LEFT KNEE OSTEOARTHRITIS  The various methods of treatment have been discussed with the patient and family. After consideration of risks, benefits and other options for treatment, the patient has consented to  Procedure(s): TOTAL KNEE ARTHROPLASTY (Left) as a surgical intervention .  The patient's history has been reviewed, patient examined, no change in status, stable for surgery.  I have reviewed the patient's chart and labs.  Questions were answered to the patient's satisfaction.     Nestor Lewandowsky

## 2012-07-21 NOTE — Anesthesia Procedure Notes (Signed)
Procedure Name: LMA Insertion Date/Time: 07/21/2012 9:30 AM Performed by: Quentin Ore Pre-anesthesia Checklist: Patient identified, Emergency Drugs available, Suction available, Patient being monitored and Timeout performed Patient Re-evaluated:Patient Re-evaluated prior to inductionOxygen Delivery Method: Circle system utilized Preoxygenation: Pre-oxygenation with 100% oxygen Intubation Type: IV induction Ventilation: Mask ventilation without difficulty LMA: LMA inserted LMA Size: 4.0 Number of attempts: 1 Placement Confirmation: positive ETCO2 and breath sounds checked- equal and bilateral Tube secured with: Tape Dental Injury: Teeth and Oropharynx as per pre-operative assessment

## 2012-07-21 NOTE — Progress Notes (Signed)
Orthopedic Tech Progress Note Patient Details:  Lori Wiggins [birth year] [id number] Applied CPM to LLE.  Applied OHF with trapeze to bed. CPM Left Knee CPM Left Knee: On Left Knee Flexion (Degrees): 60 Left Knee Extension (Degrees): 0   [name] 07/21/2012, 1:41 PM

## 2012-07-21 NOTE — Anesthesia Preprocedure Evaluation (Addendum)
Anesthesia Evaluation  Patient identified by MRN, date of birth, ID band Patient awake    Reviewed: Allergy & Precautions, H&P , NPO status , Patient's Chart, lab work & pertinent test results  Airway Mallampati: II TM Distance: <3 FB Neck ROM: full    Dental  (+) Teeth Intact and Dental Advisory Given   Pulmonary sleep apnea and Continuous Positive Airway Pressure Ventilation ,  breath sounds clear to auscultation        Cardiovascular hypertension, On Medications Rhythm:Regular Rate:Normal     Neuro/Psych    GI/Hepatic GERD-  Medicated and Controlled,  Endo/Other  Hypothyroidism   Renal/GU      Musculoskeletal   Abdominal   Peds  Hematology   Anesthesia Other Findings   Reproductive/Obstetrics                          Anesthesia Physical Anesthesia Plan  ASA: II  Anesthesia Plan: Spinal   Post-op Pain Management:    Induction: Intravenous  Airway Management Planned:   Additional Equipment:   Intra-op Plan:   Post-operative Plan:   Informed Consent: I have reviewed the patients History and Physical, chart, labs and discussed the procedure including the risks, benefits and alternatives for the proposed anesthesia with the patient or authorized representative who has indicated his/her understanding and acceptance.   Dental Advisory Given and Dental advisory given  Plan Discussed with: Anesthesiologist, CRNA and Surgeon  Anesthesia Plan Comments:        Anesthesia Quick Evaluation

## 2012-07-21 NOTE — Op Note (Signed)
PATIENT ID:      Lori Wiggins  MRN:     [id number] DOB/AGE:    [birth year] / 79 y.o.       OPERATIVE REPORT    DATE OF PROCEDURE:  07/21/2012       PREOPERATIVE DIAGNOSIS:   LEFT KNEE OSTEOARTHRITIS      There is no weight on file to calculate BMI.                                                        POSTOPERATIVE DIAGNOSIS:   LEFT KNEE OSTEOARTHRITIS                                                                      PROCEDURE:  Procedure(s): TOTAL KNEE ARTHROPLASTY Using Depuy Sigma RP implants #2.5L Femur, #2.5Tibia, 10mm sigma RP bearing, 35 Patella     SURGEON: Jasdeep Dejarnett J    ASSISTANT:   Shirl Derusha PA-C   (Present and scrubbed throughout the case, critical for assistance with exposure, retraction, instrumentation, and closure.)         ANESTHESIA: GET Exparel  DRAINS: foley, 2 medium hemovac in knee   TOURNIQUET TIME:   COMPLICATIONS:  None     SPECIMENS: None   INDICATIONS FOR PROCEDURE: The patient has  LEFT KNEE OSTEOARTHRITIS, varus deformities, XR shows bone on bone arthritis. Patient has failed all conservative measures including anti-inflammatory medicines, narcotics, attempts at  exercise and weight loss, cortisone injections and viscosupplementation.  Risks and benefits of surgery have been discussed, questions answered.   DESCRIPTION OF PROCEDURE: The patient identified by armband, received  IV antibiotics, in the holding area at [facility]. Patient taken to the operating room, appropriate anesthetic  monitors were attached, and general endotracheal anesthesia induced with  the patient in supine position, Foley catheter was inserted. Tourniquet  applied high to the operative thigh. Lateral post and foot positioner  applied to the table, the lower extremity was then prepped and draped  in usual sterile fashion from the ankle to the tourniquet. Time-out procedure was performed. The limb was wrapped with an Esmarch bandage and the tourniquet  inflated to 350 mmHg. We began the operation by making the anterior midline incision starting at handbreadth above the patella going over the patella 1 cm medial to and  4 cm distal to the tibial tubercle. Small bleeders in the skin and the  subcutaneous tissue identified and cauterized. Transverse retinaculum was incised and reflected medially and a medial parapatellar arthrotomy was accomplished. the patella was everted and theprepatellar fat pad resected. The superficial medial collateral  ligament was then elevated from anterior to posterior along the proximal  flare of the tibia and anterior half of the menisci resected. The knee was hyperflexed exposing bone on bone arthritis. Peripheral and notch osteophytes as well as the cruciate ligaments were then resected. We continued to  work our way around posteriorly along the proximal tibia, and externally  rotated the tibia subluxing it out from underneath the femur. A McHale  retractor was placed through the notch and  a lateral Hohmann retractor  placed, and we then drilled through the proximal tibia in line with the  axis of the tibia followed by an intramedullary guide rod and 2-degree  posterior slope cutting guide. The tibial cutting guide was pinned into place  allowing resection of 4 mm of bone medially and about 8 mm of bone  laterally because of her varus deformity. Satisfied with the tibial resection, we then  entered the distal femur 2 mm anterior to the PCL origin with the  intramedullary guide rod and applied the distal femoral cutting guide  set at 11mm, with 5 degrees of valgus. This was pinned along the  epicondylar axis. At this point, the distal femoral cut was accomplished without difficulty. We then sized for a #2.5L femoral component and pinned the guide in 3 degrees of external rotation.The chamfer cutting guide was pinned into place. The anterior, posterior, and chamfer cuts were accomplished without difficulty followed by   the Sigma RP box cutting guide and the box cut. We also removed posterior osteophytes from the posterior femoral condyles. At this  time, the knee was brought into full extension. We checked our  extension and flexion gaps and found them symmetric at 10mm.  The patella thickness measured at 25 mm. We set the cutting guide at 15 and removed the posterior 10 mm  of the patella, sized for a 35 button and drilled the lollipop. The knee  was then once again hyperflexed exposing the proximal tibia. We sized for a #2.5 tibial base plate, applied the smokestack and the conical reamer followed by the the Delta fin keel punch. We then hammered into place the Sigma RP trial femoral component, inserted a 10-mm trial bearing, trial patellar button, and took the knee through range of motion from 0-130 degrees. No thumb pressure was required for patellar  tracking. At this point, all trial components were removed, a double batch of DePuy HV cement with 1500 mg of Zinacef was mixed and applied to all bony metallic mating surfaces except for the posterior condyles of the femur itself. In order, we  hammered into place the tibial tray and removed excess cement, the femoral component and removed excess cement, a 10-mm Sigma RP bearing  was inserted, and the knee brought to full extension with compression.  The patellar button was clamped into place, and excess cement  removed. While the cement cured the wound was irrigated out with normal saline solution pulse lavage, and medium Hemovac drains were placed from an anterolateral  approach. Ligament stability and patellar tracking were checked and found to be excellent. The parapatellar arthrotomy was closed with  running #1 Vicryl suture. The subcutaneous tissue with 0 and 2-0 undyed  Vicryl suture, and the skin with skin staples. A dressing of Xeroform,  4 x 4, dressing sponges, Webril, and Ace wrap applied. The patient  awakened, extubated, and taken to recovery room  without difficulty.   Aiva Miskell J 07/21/2012, 10:59 AM

## 2012-07-21 NOTE — Progress Notes (Signed)
Patient placed on CPAP set at home settings of 13 cmH2O.  Patient is using home mask and tubing and our machine.  Tolerating well at this time.  RT will continue to monitor.

## 2012-07-21 NOTE — Evaluation (Signed)
Physical Therapy Evaluation Patient Details Name: Lori Wiggins MRN: [id number] DOB: 26-Mar-1928 Today's Date: 07/21/2012 Time: 1932-2006 PT Time Calculation (min): 34 min  PT Assessment / Plan / Recommendation History of Present Illness  Patient is an 79 yo female s/p Lt. TKA.  Clinical Impression  Patient presents with problems listed below.  Will benefit from acute PT to maximize independence prior to discharge. Patient anxious during session today.  Recommend ST-SNF for continued therapy at discharge.    PT Assessment  Patient needs continued PT services    Follow Up Recommendations  SNF    Does the patient have the potential to tolerate intense rehabilitation      Barriers to Discharge        Equipment Recommendations  None recommended by PT    Recommendations for Other Services     Frequency 7X/week    Precautions / Restrictions Precautions Precautions: Fall;Knee Precaution Booklet Issued: Yes (comment) Precaution Comments: Provided patient with education on precautions. Restrictions Weight Bearing Restrictions: Yes LLE Weight Bearing: Weight bearing as tolerated   Pertinent Vitals/Pain       Mobility  Bed Mobility Bed Mobility: Supine to Sit;Sitting - Scoot to Edge of Bed Supine to Sit: 3: Mod assist;With rails;HOB elevated Sitting - Scoot to Edge of Bed: 4: Min assist Details for Bed Mobility Assistance: Verbal cues for technique.  Assist to move LLE off of bed and to raise trunk to sitting position.  Patient sat at EOB x 8 minutes with good balance.   Transfers Transfers: Sit to Stand;Stand to [employer] Transfers Sit to Stand: 4: Min assist;With upper extremity assist;From bed Stand to Sit: 4: Min assist;With upper extremity assist;With armrests;To chair/3-in-1 Stand Pivot Transfers: 3: Mod assist Details for Transfer Assistance: Verbal cues for hand placement.  Assist to rise to standing and for balance.  Patient able to take a few steps to pivot  to chair - required mod assist and encouragement to use UE's on RW to bear weight. Ambulation/Gait Ambulation/Gait Assistance: Not tested (comment)    Exercises Total Joint Exercises Ankle Circles/Pumps: AROM;Both;10 reps;Seated   PT Diagnosis: Difficulty walking;Abnormality of gait;Generalized weakness;Acute pain  PT Problem List: Decreased strength;Decreased range of motion;Decreased activity tolerance;Decreased balance;Decreased mobility;Decreased knowledge of use of DME;Decreased knowledge of precautions;Pain PT Treatment Interventions: DME instruction;Gait training;Functional mobility training;Therapeutic exercise;Patient/family education     PT Goals(Current goals can be found in the care plan section) Acute Rehab PT Goals Patient Stated Goal: To get stronger PT Goal Formulation: With patient Time For Goal Achievement: 08/04/12 Potential to Achieve Goals: Good  Visit Information  Last PT Received On: 07/21/12 Assistance Needed: +1 History of Present Illness: Patient is an 79 yo female s/p Lt. TKA.       Prior Functioning  Home Living Family/patient expects to be discharged to:: Skilled nursing facility Living Arrangements: Spouse/significant other Prior Function Level of Independence: Independent Comments: Per chart, patient with balance issues pta. Communication Communication: No difficulties    Cognition  Cognition Arousal/Alertness: Awake/alert Behavior During Therapy: WFL for tasks assessed/performed Overall Cognitive Status: Within Functional Limits for tasks assessed    Extremity/Trunk Assessment Upper Extremity Assessment Upper Extremity Assessment: Generalized weakness Lower Extremity Assessment Lower Extremity Assessment: LLE deficits/detail LLE Deficits / Details: Decreased strength and ROM due to surgery LLE: Unable to fully assess due to pain   Balance    End of Session PT - End of Session Equipment Utilized During Treatment: Gait  belt;Oxygen Activity Tolerance: Patient limited by pain;Patient limited by fatigue  Patient left: in chair;with call bell/phone within reach;with family/visitor present;with nursing/sitter in room Nurse Communication: Mobility status CPM Left Knee CPM Left Knee: Off (off at 19:35)  GP     Vena Austria 07/21/2012, 8:24 PM Durenda Hurt. Renaldo Fiddler, [facility] Pager [phone]

## 2012-07-21 NOTE — Anesthesia Postprocedure Evaluation (Signed)
  Anesthesia Post-op Note  Patient: Lori Wiggins  Procedure(s) Performed: Procedure(s): TOTAL KNEE ARTHROPLASTY (Left)  Patient Location: PACU  Anesthesia Type:General  Level of Consciousness: awake, alert  and oriented  Airway and Oxygen Therapy: Patient Spontanous Breathing and Patient connected to nasal cannula oxygen  Post-op Pain: mild  Post-op Assessment: Post-op Vital signs reviewed  Post-op Vital Signs: Reviewed  Complications: No apparent anesthesia complications

## 2012-07-21 NOTE — Preoperative (Signed)
Beta Blockers   Reason not to administer Beta Blockers:Not Applicable 

## 2012-07-21 NOTE — Progress Notes (Signed)
ANTICOAGULATION CONSULT NOTE - Initial Consult  Pharmacy Consult for Coumadin Indication: VTE prophylaxis  Allergies  Allergen Reactions  . Sulfa Antibiotics Other (See Comments)    Unknown    . Nsaids Rash    Pt. does not want to take at all.    Patient Measurements: Weight = 60 kg  Baseline INR = 1.10  Medical History: Past Medical History  Diagnosis Date  . Hypertension   . Hypothyroidism   . Sleep apnea     uses CPAP  . GERD (gastroesophageal reflux disease)   . Arthritis   . Cancer     left  breast    Assessment: 79 year old female s/p TKR, baseline INR = 1.10  Goal of Therapy:  INR = 1.8 to 2.5 Monitor platelets by anticoagulation protocol: Yes   Plan:  1) Coumadin 4 mg po daily at 1800 pm 2) Daily INR 3) Begin Coumadin education  Thank you. Okey Regal, PharmD [phone]  07/21/2012,2:46 PM

## 2012-07-21 NOTE — Progress Notes (Signed)
UR COMPLETED  

## 2012-07-22 ENCOUNTER — Encounter (HOSPITAL_COMMUNITY): Payer: Self-pay | Admitting: General Practice

## 2012-07-22 LAB — CBC
HCT: 28.1 % — ABNORMAL LOW (ref 36.0–46.0)
MCH: 29 pg (ref 26.0–34.0)
MCHC: 34.9 g/dL (ref 30.0–36.0)
MCV: 83.1 fL (ref 78.0–100.0)
Platelets: 154 10*3/uL (ref 150–400)
RDW: 14.2 % (ref 11.5–15.5)

## 2012-07-22 LAB — BASIC METABOLIC PANEL
BUN: 9 mg/dL (ref 6–23)
Calcium: 8.5 mg/dL (ref 8.4–10.5)
Creatinine, Ser: 0.65 mg/dL (ref 0.50–1.10)
GFR calc Af Amer: >90 mL/min (ref >90)
GFR calc non Af Amer: 80 mL/min — ABNORMAL LOW (ref >90)

## 2012-07-22 MED ORDER — WARFARIN SODIUM 2.5 MG PO TABS
2.5000 mg | ORAL_TABLET | Freq: Once | ORAL | Status: AC
  Administered 2012-07-22: 2.5 mg via ORAL
  Filled 2012-07-22 (×2): qty 1

## 2012-07-22 NOTE — Evaluation (Signed)
Occupational Therapy Evaluation Patient Details Name: Lori Wiggins MRN: [id number] DOB: 1928/11/29 Today's Date: 07/22/2012 Time: 4098-1191 OT Time Calculation (min): 22 min  OT Assessment / Plan / Recommendation History of present illness Patient is an 79 yo female s/p Lt. TKA.   Clinical Impression   Patient presents with problems listed below. Will benefit from acute OT to maximize independence prior to discharge. Patient anxious during session today. Recommend ST-SNF for continued therapy at discharge.     OT Assessment  Patient needs continued OT Services    Follow Up Recommendations  SNF;Supervision/Assistance - 24 hour    Barriers to Discharge      Equipment Recommendations  Other (comment) (defer to snf)    Recommendations for Other Services    Frequency  Min 2X/week    Precautions / Restrictions Precautions Precautions: Fall;Knee Precaution Booklet Issued: No Precaution Comments: Provided patient with education on precautions. Restrictions Weight Bearing Restrictions: Yes LLE Weight Bearing: Weight bearing as tolerated   Pertinent Vitals/Pain Pain 9-10/10. Repositioned. Nurse notified.     ADL  Eating/Feeding: Independent Where Assessed - Eating/Feeding: Chair Grooming: Set up;Supervision/safety Where Assessed - Grooming: Supported sitting Upper Body Bathing: Set up;Supervision/safety Where Assessed - Upper Body Bathing: Supported sitting Lower Body Bathing: Moderate assistance Where Assessed - Lower Body Bathing: Supported sit to stand Upper Body Dressing: Set up;Supervision/safety Where Assessed - Upper Body Dressing: Supported sitting Lower Body Dressing: Moderate assistance Where Assessed - Lower Body Dressing: Supported sit to Pharmacist, hospital: Performed;Minimal Dentist Method: Sit to Barista: Raised toilet seat with arms (or 3-in-1 over toilet) Toileting - Clothing Manipulation and Hygiene: Minimal  assistance Where Assessed - Engineer, mining and Hygiene: Sit to stand from 3-in-1 or toilet Tub/Shower Transfer Method: Not assessed Equipment Used: Gait belt;Rolling walker Transfers/Ambulation Related to ADLs: Min A for ambulation and transfers. ADL Comments: Pt ambulated in room to 3 in 1. Pt performed hygiene/clothing at min A level-Pt presents with balance deficits. Pt anxious during session. Overall Mod A for LB ADLs-unable to don/doff socks at this time.    OT Diagnosis: Acute pain  OT Problem List: Decreased strength;Decreased range of motion;Decreased activity tolerance;Impaired balance (sitting and/or standing);Decreased knowledge of use of DME or AE;Decreased knowledge of precautions;Pain OT Treatment Interventions: Self-care/ADL training;DME and/or AE instruction;Therapeutic activities;Patient/family education;Balance training   OT Goals(Current goals can be found in the care plan section) Acute Rehab OT Goals Patient Stated Goal: to get better OT Goal Formulation: With patient Time For Goal Achievement: 07/29/12 Potential to Achieve Goals: Good ADL Goals Pt Will Perform Grooming: with supervision;standing Pt Will Perform Lower Body Bathing: with min guard assist;sit to/from stand Pt Will Perform Lower Body Dressing: with min guard assist;sit to/from stand Pt Will Transfer to Toilet: with supervision;ambulating;bedside commode Pt Will Perform Toileting - Clothing Manipulation and hygiene: sit to/from stand;with min guard assist  Visit Information  Last OT Received On: 07/22/12 Assistance Needed: +1 History of Present Illness: Patient is an 79 yo female s/p Lt. TKA.       Prior Functioning     Home Living Family/patient expects to be discharged to:: Skilled nursing facility [facility] for rehab) Living Arrangements: Spouse/significant other Prior Function Level of Independence: Independent Comments: Per chart, patient with balance issues  pta. Communication Communication: No difficulties         Vision/Perception     Cognition  Cognition Arousal/Alertness: Awake/alert Behavior During Therapy: Anxious Overall Cognitive Status: Within Functional Limits for tasks assessed  Extremity/Trunk Assessment Upper Extremity Assessment Upper Extremity Assessment: Overall WFL for tasks assessed     Mobility Bed Mobility Bed Mobility: Not assessed Transfers Transfers: Sit to Stand;Stand to Sit Sit to Stand: 4: Min assist;With upper extremity assist;From chair/3-in-1 Stand to Sit: 4: Min assist;With upper extremity assist;To chair/3-in-1 Details for Transfer Assistance: vc's for hand placement- assist to achieve standing position        Balance     End of Session OT - End of Session Equipment Utilized During Treatment: Gait belt;Rolling walker Activity Tolerance: Patient tolerated treatment well Patient left: in chair;with call bell/phone within reach;with family/visitor present Nurse Communication: Patient requests pain meds CPM Left Knee CPM Left Knee: Off  GO     Earlie Raveling OTR/L [phone] 07/22/2012, 1:49 PM

## 2012-07-22 NOTE — Progress Notes (Signed)
Physical Therapy Treatment Patient Details Name: Lori Wiggins MRN: [id number] DOB: [birth year] Today's Date: 07/22/2012 Time: 1478-2956 PT Time Calculation (min): 28 min  PT Assessment / Plan / Recommendation  History of Present Illness Patient is an 79 yo female s/p Lt. TKA.      PT Comments   Pt making progress toward PT goals with ambulation today. Pt is limited by c/o pain and fatigue. Pt also needs mod encouragement for gait training due to fear of bearing weight through the LLE. Pt will benefit from skilled PT in order to increase her strength, mobility, and functional independence.   Follow Up Recommendations  SNF     Does the patient have the potential to tolerate intense rehabilitation     Barriers to Discharge        Equipment Recommendations  None recommended by PT    Recommendations for Other Services    Frequency 7X/week   Progress towards PT Goals Progress towards PT goals: Progressing toward goals  Plan      Precautions / Restrictions Precautions Precautions: Fall;Knee Restrictions Weight Bearing Restrictions: Yes LLE Weight Bearing: Weight bearing as tolerated   Pertinent Vitals/Pain Pt reported 8/10 pain in LLE (nursing gave pain medication).    Mobility  Bed Mobility Bed Mobility: Supine to Sit;Sitting - Scoot to Edge of Bed Supine to Sit: 4: Min assist;With rails;HOB elevated Sitting - Scoot to Edge of Bed: 4: Min guard Details for Bed Mobility Assistance: Verbal cues for technique. Assist to move LLE off of bed and to raise trunk to sitting position. Transfers Transfers: Sit to Stand;Stand to Sit Sit to Stand: 4: Min assist;With upper extremity assist;From bed Stand to Sit: 4: Min assist;With upper extremity assist;With armrests;To chair/3-in-1 Details for Transfer Assistance: VC for hand placement and sequencing. Physical assist to achieve a standing position. Ambulation/Gait Ambulation/Gait Assistance: 4: Min assist Ambulation Distance (Feet):  5 Feet Assistive device: Rolling walker Ambulation/Gait Assistance Details: VC for RW sequencing and use of UE to help take weight off of LE. mod encouragement to participate in ambulation and bear weight through LLE.  Gait Pattern: Step-to pattern;Decreased step length - right;Decreased stance time - left;Trunk flexed Gait velocity: slow    Exercises Total Joint Exercises Ankle Circles/Pumps: AROM;Both;10 reps;Supine Quad Sets: AROM;Left;10 reps;Supine Heel Slides: AAROM;Left;10 reps;Supine Hip ABduction/ADduction: AAROM;Left;10 reps;Supine      PT Goals (current goals can now be found in the care plan section) Acute Rehab PT Goals Time For Goal Achievement: 08/04/12 Potential to Achieve Goals: Good  Visit Information  Last PT Received On: 07/22/12 Assistance Needed: +1 History of Present Illness: Patient is an 79 yo female s/p Lt. TKA.    Subjective Data      Cognition  Cognition Arousal/Alertness: Awake/alert Behavior During Therapy: WFL for tasks assessed/performed Overall Cognitive Status: Within Functional Limits for tasks assessed    Balance     End of Session PT - End of Session Equipment Utilized During Treatment: Gait belt Activity Tolerance: Patient limited by pain;Patient limited by fatigue Patient left: in chair;with call bell/phone within reach;with nursing/sitter in room;with family/visitor present Nurse Communication: Mobility status   GP     Jolyn Nap, SPTA 07/22/2012, 11:51 AM

## 2012-07-22 NOTE — Progress Notes (Signed)
Physical Therapy Treatment Patient Details Name: Lori Wiggins MRN: [id number] DOB: 1928/12/09 Today's Date: 07/22/2012 Time: 3664-4034 PT Time Calculation (min): 27 min  PT Assessment / Plan / Recommendation  History of Present Illness Patient is an 79 yo female s/p Lt. TKA.      PT Comments   Pt slowly making progress toward PT goals with increased ambulation distance. Pt continues to be fearful and anxious about bearing weight through her LLE. PT will continue to follow.  Follow Up Recommendations  SNF     Does the patient have the potential to tolerate intense rehabilitation     Barriers to Discharge        Equipment Recommendations  None recommended by PT    Recommendations for Other Services    Frequency 7X/week   Progress towards PT Goals Progress towards PT goals: Progressing toward goals  Plan Current plan remains appropriate    Precautions / Restrictions Precautions Precautions: Fall;Knee Precaution Booklet Issued: No Precaution Comments: Provided patient with education on precautions. Restrictions Weight Bearing Restrictions: Yes LLE Weight Bearing: Weight bearing as tolerated   Pertinent Vitals/Pain Pt c/o pain but unable to articulate pain scale, suspect due to pain medications taking affect.    Mobility  Bed Mobility Bed Mobility: Not assessed Supine to Sit: 4: Min assist;With rails;HOB elevated Sitting - Scoot to Edge of Bed: 4: Min guard Details for Bed Mobility Assistance: Verbal cues for technique. Assist to move LLE off of bed and to raise trunk to sitting position. Transfers Transfers: Sit to Stand;Stand to Sit Sit to Stand: 4: Min assist;With upper extremity assist;With armrests;From chair/3-in-1 Stand to Sit: 4: Min assist;With upper extremity assist;With armrests;To chair/3-in-1 Details for Transfer Assistance: VC for hand placement and sequencing. Physical assist to achieve a standing position. Ambulation/Gait Ambulation/Gait Assistance: 4:  Min assist Ambulation Distance (Feet): 10 Feet Assistive device: Rolling walker Ambulation/Gait Assistance Details: VC for RW sequencing and proximity. Cueing and mod encouragement for weight shift onto LLE. Gait Pattern: Step-to pattern;Decreased step length - right;Decreased stance time - left;Trunk flexed Gait velocity: slow Stairs: No Wheelchair Mobility Wheelchair Mobility: No    Exercises Total Joint Exercises Ankle Circles/Pumps: AROM;Both;10 reps;Supine Quad Sets: AROM;Left;10 reps;Seated Heel Slides: AAROM;Left;10 reps;Supine Hip ABduction/ADduction: AAROM;Left;10 reps;Seated Long Arc Quad: AAROM;Left;10 reps;Seated     PT Goals (current goals can now be found in the care plan section) Acute Rehab PT Goals Patient Stated Goal: to get better Time For Goal Achievement: 08/04/12 Potential to Achieve Goals: Good  Visit Information  Last PT Received On: 07/22/12 Assistance Needed: +1 History of Present Illness: Patient is an 79 yo female s/p Lt. TKA.    Subjective Data  Patient Stated Goal: to get better   Cognition  Cognition Arousal/Alertness: Awake/alert Behavior During Therapy: Anxious Overall Cognitive Status: Within Functional Limits for tasks assessed    Balance     End of Session PT - End of Session Equipment Utilized During Treatment: Gait belt Activity Tolerance: Patient limited by pain;Patient limited by fatigue Patient left: in chair;with call bell/phone within reach;with family/visitor present Nurse Communication: Mobility status CPM Left Knee CPM Left Knee: Off   GP     Jolyn Nap, SPTA 07/22/2012, 3:06 PM

## 2012-07-22 NOTE — Progress Notes (Signed)
Seen and agreed 07/22/2012 Robinette, Julia Elizabeth PTA [phone] pager [phone] office    

## 2012-07-22 NOTE — Progress Notes (Addendum)
ANTICOAGULATION CONSULT NOTE   Pharmacy Consult for Coumadin Indication: VTE prophylaxis  Allergies  Allergen Reactions  . Sulfa Antibiotics Other (See Comments)    Unknown    . Nsaids Rash    Pt. does not want to take at all.    Patient Measurements: Weight = 60 kg  Baseline INR = 1.10  Medical History: Past Medical History  Diagnosis Date  . Hypertension   . Hypothyroidism   . Sleep apnea     uses CPAP  . GERD (gastroesophageal reflux disease)   . Arthritis   . Cancer     left  breast    Assessment: 79 year old female s/p TKR, baseline INR = 1.10. INR trending up quickly to 1.4 this morning after initial dose of warfarin. No bleeding issues noted. Noted possible plans to transition to bid aspirin at discharge.  Noted patient continues on cipro with can potentiate INR, will continue to monitor closely.  Goal of Therapy:  INR = 1.8 to 2.5 Monitor platelets by anticoagulation protocol: Yes   Plan:  1) Coumadin 2.5 mg po today at 1800 pm 2) Daily INR 3) Begin Coumadin education  Sheppard Coil PharmD., BCPS Clinical Pharmacist Pager [phone] 07/22/2012 11:02 AM

## 2012-07-22 NOTE — Progress Notes (Addendum)
PATIENT ID: Lori Wiggins  MRN: [id number]  DOB/AGE:  1928/01/10 / 79 y.o.  1 Day Post-Op Procedure(s) (LRB): TOTAL KNEE ARTHROPLASTY (Left)    PROGRESS NOTE Subjective: Patient is alert, oriented, no Nausea, no Vomiting, yes passing gas, no Bowel Movement. Taking PO well. Denies SOB, Chest or Calf Pain. Using Incentive Spirometer, PAS in place. Ambulate WBAT, CPM 0-60. Patient reports pain as moderate  .    Objective: Vital signs in last 24 hours: Filed Vitals:   07/21/12 2057 07/21/12 2340 07/22/12 0204 07/22/12 0517  BP: 147/67  141/68 144/63  Pulse: 85  83 82  Temp: 98.1 F (36.7 C)  98.9 F (37.2 C) 99 F (37.2 C)  TempSrc:      Resp: 16  16 14   SpO2: 98% 98% 98% 99%      Intake/Output from previous day: I/O last 3 completed shifts: In: 1880.4 [P.O.:120; I.V.:1760.4] Out: 1940 [Urine:1475; Drains:415; Blood:50]   Intake/Output this shift:     LABORATORY DATA:  Recent Labs  07/21/12 1524  WBC 14.8*  HGB 11.3*  HCT 32.4*  PLT 155  CREATININE 0.70    Examination: Neurologically intact ABD soft Neurovascular intact Sensation intact distally Intact pulses distally Dorsiflexion/Plantar flexion intact Incision: scant drainage Compartment soft} Blood and plasma separated in drain indicating minimal recent drainage, drain pulled without difficulty.  Assessment:   1 Day Post-Op Procedure(s) (LRB): TOTAL KNEE ARTHROPLASTY (Left) ADDITIONAL DIAGNOSIS:  Hypertension, UTI Preop and Sleep Apnea  Plan: PT/OT WBAT, CPM 5/hrs day until ROM 0-90 degrees, then D/C CPM DVT Prophylaxis:  SCDx72hrs, ASA 325 mg BID x 2 weeks DISCHARGE PLAN: Skilled Nursing Facility/Rehab, [facility] when cleared by PT. DISCHARGE NEEDS: HHPT, HHRN, CPM, Walker and 3-in-1 comode seat     Lori Wiggins J 07/22/2012, 7:44 AM

## 2012-07-23 LAB — CBC
MCH: 29 pg (ref 26.0–34.0)
MCHC: 35.4 g/dL (ref 30.0–36.0)
MCV: 81.9 fL (ref 78.0–100.0)
Platelets: 147 10*3/uL — ABNORMAL LOW (ref 150–400)
RBC: 3.21 MIL/uL — ABNORMAL LOW (ref 3.87–5.11)

## 2012-07-23 MED ORDER — WARFARIN SODIUM 5 MG PO TABS: 5.0000 mg | ORAL_TABLET | Freq: Every day | ORAL | Status: DC

## 2012-07-23 MED ORDER — TIZANIDINE HCL 2 MG PO CAPS: 4.0000 mg | ORAL_CAPSULE | Freq: Three times a day (TID) | ORAL | Status: DC | PRN

## 2012-07-23 MED ORDER — BISACODYL 10 MG RE SUPP
10.0000 mg | Freq: Every day | RECTAL | Status: DC | PRN
  Administered 2012-07-23: 10 mg via RECTAL
  Filled 2012-07-23: qty 1

## 2012-07-23 MED ORDER — FLEET ENEMA 7-19 GM/118ML RE ENEM
1.0000 | ENEMA | Freq: Every day | RECTAL | Status: DC | PRN
  Administered 2012-07-23: 1 via RECTAL
  Filled 2012-07-23: qty 1

## 2012-07-23 MED ORDER — CEPHALEXIN 250 MG PO CAPS
250.0000 mg | ORAL_CAPSULE | Freq: Four times a day (QID) | ORAL | Status: DC
  Administered 2012-07-23 – 2012-07-24 (×3): 250 mg via ORAL
  Filled 2012-07-23 (×7): qty 1

## 2012-07-23 MED ORDER — OXYCODONE-ACETAMINOPHEN 5-325 MG PO TABS: 1.0000 | ORAL_TABLET | ORAL | Status: DC | PRN

## 2012-07-23 NOTE — Progress Notes (Signed)
Orthopedic Tech Progress Note Patient Details:  Lori Wiggins 01-Mar-1928 [id number] Put on cpm at 3:20 pm LLE 0-60 Patient ID: [name], female   DOB: [birth year], 79 y.o.   MRN: [id number]   Jennye Moccasin 07/23/2012, 3:19 PM

## 2012-07-23 NOTE — Progress Notes (Signed)
Placed pt. On CPAP via nasal pillows, home settings of 13.0 cm H20 with 2 lpm O2 bleed in.  Pt. Tolerating well at this time. RN aware.

## 2012-07-23 NOTE — Progress Notes (Signed)
PATIENT ID: Lori Wiggins  MRN: [id number]  DOB/AGE:  [birth year] / 79 y.o.  2 Days Post-Op Procedure(s) (LRB): TOTAL KNEE ARTHROPLASTY (Left)    PROGRESS NOTE Subjective: Patient is alert, oriented, no Nausea, no Vomiting, yes passing gas, no Bowel Movement. Taking PO well.  Pt up and eating. Denies SOB, Chest or Calf Pain. Using Incentive Spirometer, PAS in place. Ambulate WBAT, CPM 0-50. Patient reports pain as moderate  .    Objective: Vital signs in last 24 hours: Filed Vitals:   07/22/12 2000 07/22/12 2237 07/23/12 0000 07/23/12 0545  BP:    127/59  Pulse:    87  Temp:    99 F (37.2 C)  TempSrc:    Oral  Resp: 18  18 16   SpO2: 97% 98% 98% 94%      Intake/Output from previous day: I/O last 3 completed shifts: In: 2810.4 [P.O.:800; I.V.:2010.4] Out: 1465 [Urine:1275; Drains:190]   Intake/Output this shift:     LABORATORY DATA:  Recent Labs  07/21/12 1524 07/22/12 0835 07/23/12 0520  WBC 14.8* 10.4 13.0*  HGB 11.3* 9.8* 9.3*  HCT 32.4* 28.1* 26.3*  PLT 155 154 147*  NA  --  123*  --   K  --  3.7  --   CL  --  90*  --   CO2  --  23  --   BUN  --  9  --   CREATININE 0.70 0.65  --   GLUCOSE  --  164*  --   INR  --  1.45 3.94*  CALCIUM  --  8.5  --     Examination: Neurologically intact ABD soft Neurovascular intact Sensation intact distally Intact pulses distally Dorsiflexion/Plantar flexion intact Incision: scant drainage Compartment soft}  Assessment:   2 Days Post-Op Procedure(s) (LRB): TOTAL KNEE ARTHROPLASTY (Left) ADDITIONAL DIAGNOSIS:  Hypertension and Sleep Apnea  Plan: PT/OT WBAT, CPM 5/hrs day until ROM 0-90 degrees, then D/C CPM DVT Prophylaxis:  SCDx72hrs, ASA 325 mg BID x 2 weeks DISCHARGE PLAN: Skilled Nursing Facility/Rehab DISCHARGE NEEDS: HHPT, HHRN, CPM, Walker and 3-in-1 comode seat     Lori Wiggins J 07/23/2012, 7:56 AM

## 2012-07-23 NOTE — Discharge Summary (Signed)
Patient ID: Aviannah Castoro MRN: [id number] DOB/AGE: [birth year] 79 y.o.  Admit date: 07/21/2012 Discharge date: 07/23/2012  Admission Diagnoses:  Principal Problem:   Arthritis of knee, left   Discharge Diagnoses:  Same  Past Medical History  Diagnosis Date  . Hypertension   . Hypothyroidism   . Sleep apnea     uses CPAP  . GERD (gastroesophageal reflux disease)   . Arthritis   . Cancer     left  breast    Surgeries: Procedure(s): TOTAL KNEE ARTHROPLASTY on 07/21/2012   Consultants:    Discharged Condition: Improved  Hospital Course: Kayte Borchard is an 79 y.o. female who was admitted 07/21/2012 for operative treatment ofArthritis of knee, left. Patient has severe unremitting pain that affects sleep, daily activities, and work/hobbies. After pre-op clearance the patient was taken to the operating room on 07/21/2012 and underwent  Procedure(s): TOTAL KNEE ARTHROPLASTY.    Patient was given perioperative antibiotics: Anti-infectives   Start     Dose/Rate Route Frequency Ordered Stop   07/21/12 2000  ciprofloxacin (CIPRO) tablet 500 mg     500 mg Oral 2 times daily 07/21/12 1432     07/21/12 1019  cefUROXime (ZINACEF) injection  Status:  Discontinued       As needed 07/21/12 1020 07/21/12 1110   07/21/12 0600  ceFAZolin (ANCEF) IVPB 2 g/50 mL premix     2 g 100 mL/hr over 30 Minutes Intravenous On call to O.R. 07/20/12 1403 07/21/12 0935       Patient was given sequential compression devices, early ambulation, and chemoprophylaxis to prevent DVT.  Patient benefited maximally from hospital stay and there were no complications.    Recent vital signs: Patient Vitals for the past 24 hrs:  BP Temp Temp src Pulse Resp SpO2  07/23/12 0545 127/59 mmHg 99 F (37.2 C) Oral 87 16 94 %  07/23/12 0000 - - - - 18 98 %  07/22/12 2237 - - - - - 98 %  07/22/12 2000 - - - - 18 97 %  07/22/12 1400 148/62 mmHg 98.7 F (37.1 C) Oral 88 18 97 %     Recent laboratory studies:   Recent Labs  07/21/12 1524 07/22/12 0835 07/23/12 0520  WBC 14.8* 10.4 13.0*  HGB 11.3* 9.8* 9.3*  HCT 32.4* 28.1* 26.3*  PLT 155 154 147*  NA  --  123*  --   K  --  3.7  --   CL  --  90*  --   CO2  --  23  --   BUN  --  9  --   CREATININE 0.70 0.65  --   GLUCOSE  --  164*  --   INR  --  1.45 3.94*  CALCIUM  --  8.5  --      Discharge Medications:     Medication List         CALCIUM + D + K PO  Take 1 tablet by mouth daily.     ciprofloxacin 500 MG tablet  Commonly known as:  CIPRO  Take 500 mg by mouth 2 (two) times daily.     Co Q-10 100 MG Caps  Take 100 mg by mouth daily.     Grape Seed 100 MG Caps  Take 100 mg by mouth daily.     levothyroxine 50 MCG tablet  Commonly known as:  SYNTHROID, LEVOTHROID  Take 50 mcg by mouth daily before breakfast.     multivitamin with minerals Tabs  Take 1 tablet by mouth daily.     OMEGA-3 PO  Take 360 mg by mouth daily.     oxyCODONE-acetaminophen 5-325 MG per tablet  Commonly known as:  ROXICET  Take 1 tablet by mouth every 4 (four) hours as needed for pain.     tizanidine 2 MG capsule  Commonly known as:  ZANAFLEX  Take 2 capsules (4 mg total) by mouth 3 (three) times daily as needed for muscle spasms.     valsartan-hydrochlorothiazide 160-25 MG per tablet  Commonly known as:  DIOVAN-HCT  Take 1 tablet by mouth daily.     Vitamin D3 5000 UNITS Tabs  Take 5,000 Units by mouth daily.     warfarin 5 MG tablet  Commonly known as:  COUMADIN  Take 1 tablet (5 mg total) by mouth daily.        Diagnostic Studies: Dg Chest 2 View  07/16/2012   *RADIOLOGY REPORT*  Clinical Data: Preop.  Total knee arthroplasty.  CHEST - 2 VIEW  Comparison: None.  Findings: The heart size is within normal limits.  Mild interstitial coarsening appears chronic.  No focal airspace disease is evident.  There is no edema or effusion to suggest failure. Surgical clips are noted in the left breast.  The visualized soft tissues and bony  thorax are unremarkable.  IMPRESSION:  1.  No acute cardiopulmonary disease. 2.  Mild interstitial coarsening appears chronic.   Original Report Authenticated By: Marin Roberts, M.D.    Disposition: Final discharge disposition not confirmed      Discharge Orders   Future Orders Complete By Expires     CPM  As directed     Comments:      Continuous passive motion machine (CPM):      Use the CPM from 0 to 60 for 5 hours per day.      You may increase by 10 degrees per day.  You may break it up into 2 or 3 sessions per day.      Use CPM for 2 weeks or until you are told to stop.    Call MD / Call 911  As directed     Comments:      If you experience chest pain or shortness of breath, CALL 911 and be transported to the hospital emergency room.  If you develope a fever above 101 F, pus (white drainage) or increased drainage or redness at the wound, or calf pain, call your surgeon's office.    Change dressing  As directed     Comments:      Change dressing on POD #5.  You may clean the incision with alcohol prior to redressing.    Constipation Prevention  As directed     Comments:      Drink plenty of fluids.  Prune juice may be helpful.  You may use a stool softener, such as Colace (over the counter) 100 mg twice a day.  Use MiraLax (over the counter) for constipation as needed.    Diet - low sodium heart healthy  As directed     Discharge instructions  As directed     Comments:      Follow up with Dr. Turner Daniels in 2 weeks.    Do not put a pillow under the knee. Place it under the heel.  As directed     Driving restrictions  As directed     Comments:      No driving for 2 weeks  Increase activity slowly as tolerated  As directed     Patient may shower  As directed     Comments:      You may shower without a dressing once there is no drainage.  Do not wash over the wound.  If drainage remains, cover wound with plastic wrap and then shower.       Follow-up Information   Follow up  with Nestor Lewandowsky, MD In 2 weeks.   Contact information:   [address] [place] [address] [phone]        Signed: Nestor Lewandowsky 07/23/2012, 8:09 AM

## 2012-07-23 NOTE — Progress Notes (Signed)
Seen and agreed 07/23/2012 Fredrich Birks PTA [phone] pager [phone] office

## 2012-07-23 NOTE — Progress Notes (Signed)
Physical Therapy Treatment Patient Details Name: Lori Wiggins MRN: [id number] DOB: [birth year] Today's Date: 07/23/2012 Time: 7829-5621 PT Time Calculation (min): 25 min  PT Assessment / Plan / Recommendation  History of Present Illness Patient is an 79 yo female s/p Lt. TKA.   Clinical Impression    PT Comments   Patient progressing a little better this morning. Appear less anxious as session progressed. Continue to recommmend SNF at DC and with current POC  Follow Up Recommendations  SNF     Does the patient have the potential to tolerate intense rehabilitation     Barriers to Discharge        Equipment Recommendations  None recommended by PT    Recommendations for Other Services    Frequency 7X/week   Progress towards PT Goals Progress towards PT goals: Progressing toward goals  Plan Current plan remains appropriate    Precautions / Restrictions Precautions Precautions: Fall;Knee Restrictions Weight Bearing Restrictions: Yes LLE Weight Bearing: Weight bearing as tolerated   Pertinent Vitals/Pain Increased pain. patient repositioned for comfort     Mobility  Bed Mobility Bed Mobility: Not assessed Transfers Sit to Stand: 4: Min assist;With upper extremity assist;With armrests;From chair/3-in-1 Stand to Sit: 4: Min assist;With upper extremity assist;With armrests;To chair/3-in-1 Details for Transfer Assistance: Cues for tecniqye. Patient with heavy posterior lean initially but able to correct with increased time Ambulation/Gait Ambulation/Gait Assistance: 4: Min assist Ambulation Distance (Feet): 20 Feet Assistive device: Rolling walker Ambulation/Gait Assistance Details: Cues for gait sequence. A with RW and for balance with ambulation Gait Pattern: Step-to pattern;Decreased step length - right;Decreased stance time - left;Trunk flexed Gait velocity: slow    Exercises Total Joint Exercises Quad Sets: AROM;Left;10 reps;Seated Heel Slides: AAROM;Left;10  reps;Supine Hip ABduction/ADduction: AAROM;Left;10 reps;Seated Straight Leg Raises: AAROM;Left;10 reps Long Arc Quad: AAROM;Left;10 reps;Seated   PT Diagnosis:    PT Problem List:   PT Treatment Interventions:     PT Goals (current goals can now be found in the care plan section)    Visit Information  Last PT Received On: 07/23/12 Assistance Needed: +1 History of Present Illness: Patient is an 79 yo female s/p Lt. TKA.    Subjective Data      Cognition  Cognition Arousal/Alertness: Awake/alert Behavior During Therapy: WFL for tasks assessed/performed Overall Cognitive Status: Within Functional Limits for tasks assessed    Balance     End of Session PT - End of Session Equipment Utilized During Treatment: Gait belt Activity Tolerance: Patient tolerated treatment well Patient left: in chair;with call bell/phone within reach;with family/visitor present   GP     Fredrich Birks 07/23/2012, 11:29 AM 07/23/2012 Fredrich Birks PTA [phone] pager [phone] office

## 2012-07-23 NOTE — Progress Notes (Signed)
ANTICOAGULATION CONSULT NOTE   Pharmacy Consult for Coumadin Indication: VTE prophylaxis  Allergies  Allergen Reactions  . Sulfa Antibiotics Other (See Comments)    Unknown    . Nsaids Rash    Pt. does not want to take at all.    Patient Measurements: Weight = 60 kg  Baseline INR = 1.10  Medical History: Past Medical History  Diagnosis Date  . Hypertension   . Hypothyroidism   . Sleep apnea     uses CPAP  . GERD (gastroesophageal reflux disease)   . Arthritis   . Cancer     left  breast    Assessment: 79 year old female s/p TKR, baseline INR = 1.10. INR supratherapeutic (3.94) this morning after 2 doses of warfarin. No bleeding issues noted. Noted possible plans to transition to bid aspirin at discharge.   Of note: reviewed patients Urine Cx: Resistant to cipro- paged MD  Noted patient continues on cipro with can potentiate INR, will continue to monitor closely.  Goal of Therapy:  INR = 1.8 to 2.5 Monitor platelets by anticoagulation protocol: Yes   Plan:  1) Hold Coumadin tonight 2) D/c Lovenox 3) Daily INR 4) Coumadin reeducation 5) Follow up MD page on UTI, resistant to cipro  Anabel Bene, PharmD Clinical Pharmacist Resident Pager: [phone]

## 2012-07-24 ENCOUNTER — Inpatient Hospital Stay (HOSPITAL_COMMUNITY): Payer: Medicare Other

## 2012-07-24 ENCOUNTER — Encounter (HOSPITAL_COMMUNITY): Payer: Self-pay | Admitting: Physician Assistant

## 2012-07-24 DIAGNOSIS — K560 Paralytic ileus: Secondary | ICD-10-CM

## 2012-07-24 DIAGNOSIS — K929 Disease of digestive system, unspecified: Secondary | ICD-10-CM

## 2012-07-24 DIAGNOSIS — K567 Ileus, unspecified: Secondary | ICD-10-CM | POA: Diagnosis not present

## 2012-07-24 DIAGNOSIS — E871 Hypo-osmolality and hyponatremia: Secondary | ICD-10-CM

## 2012-07-24 LAB — CBC
MCH: 29.6 pg (ref 26.0–34.0)
MCHC: 36.9 g/dL — ABNORMAL HIGH (ref 30.0–36.0)
MCV: 80.2 fL (ref 78.0–100.0)
Platelets: 178 10*3/uL (ref 150–400)
RBC: 3.28 MIL/uL — ABNORMAL LOW (ref 3.87–5.11)

## 2012-07-24 LAB — TSH: TSH: 0.843 u[IU]/mL (ref 0.350–4.500)

## 2012-07-24 MED ORDER — WARFARIN SODIUM 2 MG PO TABS
2.0000 mg | ORAL_TABLET | Freq: Once | ORAL | Status: AC
  Administered 2012-07-24: 2 mg via ORAL
  Filled 2012-07-24 (×2): qty 1

## 2012-07-24 MED ORDER — PANTOPRAZOLE SODIUM 40 MG IV SOLR
40.0000 mg | INTRAVENOUS | Status: DC
  Administered 2012-07-24 – 2012-07-29 (×6): 40 mg via INTRAVENOUS
  Filled 2012-07-24 (×6): qty 40

## 2012-07-24 MED ORDER — CEFAZOLIN SODIUM 1-5 GM-% IV SOLN
1.0000 g | Freq: Three times a day (TID) | INTRAVENOUS | Status: AC
  Administered 2012-07-24 – 2012-07-26 (×6): 1 g via INTRAVENOUS
  Filled 2012-07-24 (×6): qty 50

## 2012-07-24 MED ORDER — KCL IN DEXTROSE-NACL 20-5-0.9 MEQ/L-%-% IV SOLN
INTRAVENOUS | Status: DC
  Administered 2012-07-24 – 2012-07-25 (×2): via INTRAVENOUS
  Filled 2012-07-24 (×10): qty 1000

## 2012-07-24 NOTE — Clinical Social Work Psychosocial (Signed)
Clinical Social Work Department  BRIEF PSYCHOSOCIAL ASSESSMENT  Patient: Lori Wiggins Number: [id number]  Admit date: 07/21/12 Clinical Social Worker Sabino Niemann, MSW Date/Time: 07/24/2012  Referred by: Physician Date Referred: 07/23/12 Referred for   SNF Placement   Other Referral:  Interview type: Patient and patient's husband Other interview type: PSYCHOSOCIAL DATA  Living Status:Husband Admitted from facility:  Level of care:  Primary support name: Lori Wiggins,Lori Wiggins  Primary support relationship to patient: Husband Degree of support available:  Strong and vested  CURRENT CONCERNS  Current Concerns   Post-Acute Placement   Other Concerns:  SOCIAL WORK ASSESSMENT / PLAN  CSW met with pt and her husband re: PT recommendation for SNF.   Pt lives with her husband  CSW explained placement process and answered questions.   Pt reports [facility]  as her preference    CSW completed FL2 and initiated SNF search.     Assessment/plan status: Information/Referral to [facility]  Other assessment/ plan:  Information/referral to community resources:  SNF   PTAR  PATIENT'S/FAMILY'S RESPONSE TO PLAN OF CARE:  Pt  reports she is agreeable to ST SNF in order to increase strength and independence with mobility prior to returning home  Pt verbalized understanding of placement process and appreciation for CSW assist.   Sabino Niemann, MSW [phone]

## 2012-07-24 NOTE — Clinical Social Work Placement (Addendum)
Clinical Social Work Department  CLINICAL SOCIAL WORK PLACEMENT NOTE  07/24/2012  Patient: Lori Wiggins Account Number: [id number]  Admit date: 07/21/12  Clinical Social Worker: Sabino Niemann MSW Date/time: 07/22/12 11:30 AM  Clinical Social Work is seeking post-discharge placement for this patient at the following level of care: SKILLED NURSING (*CSW will update this form in Epic as items are completed)  07/22/12 Patient/family provided with [facility] Department of Clinical Social Work's list of facilities offering this level of care within the geographic area requested by the patient (or if unable, by the patient's family).  07/22/12 Patient/family informed of their freedom to choose among providers that offer the needed level of care, that participate in Medicare, Medicaid or managed care program needed by the patient, have an available bed and are willing to accept the patient.  07/22/12 Patient/family informed of [facility]' ownership interest in [facility], as well as of the fact that they are under no obligation to receive care at this facility.  PASARR submitted to EDS on 07/22/12 PASARR number received from EDS on 07/22/12 FL2 transmitted to all facilities in geographic area requested by pt/family on  FL2 transmitted to all facilities within larger geographic area on  Patient informed that his/her managed care company has contracts with or will negotiate with certain facilities, including the following:  Patient/family informed of bed offers received:07/22/12 Patient chooses bed at [facility] Physician recommends and patient chooses bed at  Patient to be transferred to on 07/30/12 Patient to be transferred to facility by [facility] The following physician request were entered in Epic:  Additional Comments:

## 2012-07-24 NOTE — Progress Notes (Signed)
Physical Therapy Treatment Patient Details Name: Lori Wiggins MRN: [id number] DOB: 1928/05/28 Today's Date: 07/24/2012 Time: 5621-3086 PT Time Calculation (min): 16 min  PT Assessment / Plan / Recommendation  History of Present Illness Patient is an 79 yo female s/p Lt. TKA.      PT Comments   Patient moving slowly this morning and not feeling well. Patient planning to be seen by GI doctor (for possible ilieus) at some point today. Continue to recommend SNF at discharge and continue with current POC  Follow Up Recommendations  SNF     Does the patient have the potential to tolerate intense rehabilitation     Barriers to Discharge        Equipment Recommendations  None recommended by PT    Recommendations for Other Services    Frequency 7X/week   Progress towards PT Goals Progress towards PT goals: Progressing toward goals  Plan Current plan remains appropriate    Precautions / Restrictions Precautions Precautions: Fall;Knee Restrictions LLE Weight Bearing: Weight bearing as tolerated   Pertinent Vitals/Pain " I just dont feel well"    Mobility  Bed Mobility Supine to Sit: 4: Min assist;With rails;HOB elevated Details for Bed Mobility Assistance: Verbal cues for technique. Assist to move LLE off of bed and to raise trunk to sitting position. Transfers Sit to Stand: 4: Min assist;With upper extremity assist;With armrests;From chair/3-in-1 Stand to Sit: 4: Min assist;With upper extremity assist;With armrests;To chair/3-in-1 Details for Transfer Assistance: Cues for hand placement. Patient continues with posterior lean initially but able to correct with increased time Ambulation/Gait Ambulation/Gait Assistance: 4: Min assist Ambulation Distance (Feet): 10 Feet Assistive device: Rolling walker Ambulation/Gait Assistance Details: Patient agreeable to walk to recliner but no further due to not feeling well.  Gait Pattern: Step-to pattern;Decreased step length -  right;Decreased stance time - left;Trunk flexed    Exercises Total Joint Exercises Quad Sets: AROM;Left;10 reps;Seated Heel Slides: AAROM;Left;10 reps;Supine Hip ABduction/ADduction: AAROM;Left;10 reps;Seated Straight Leg Raises: AAROM;Left;10 reps Long Arc Quad: AAROM;Left;10 reps;Seated   PT Diagnosis:    PT Problem List:   PT Treatment Interventions:     PT Goals (current goals can now be found in the care plan section)    Visit Information  Last PT Received On: 07/24/12 Assistance Needed: +1 History of Present Illness: Patient is an 79 yo female s/p Lt. TKA.    Subjective Data      Cognition  Cognition Arousal/Alertness: Awake/alert Behavior During Therapy: WFL for tasks assessed/performed Overall Cognitive Status: Within Functional Limits for tasks assessed    Balance     End of Session PT - End of Session Equipment Utilized During Treatment: Gait belt Activity Tolerance: Patient tolerated treatment well Patient left: in chair;with call bell/phone within reach;with family/visitor present Nurse Communication: Mobility status   GP     Fredrich Birks 07/24/2012, 9:33 AM  07/24/2012 Fredrich Birks PTA [phone] pager [phone] office

## 2012-07-24 NOTE — Progress Notes (Signed)
PATIENT ID: Lori Wiggins  MRN: [id number]  DOB/AGE:  Feb 28, 1928 / 79 y.o.  3 Days Post-Op Procedure(s) (LRB): TOTAL KNEE ARTHROPLASTY (Left)    PROGRESS NOTE Subjective: Patient is alert, oriented, mild Nausea,  Small amount of Vomiting, yes passing gas, yes a small thready Bowel Movement. Taking PO yesterday, but pt does not have an apitite today. Denies SOB, Chest or Calf Pain. Using Incentive Spirometer, PAS in place. Ambulate WBAT, CPM 0-60 Patient reports pain as severe  Pt also complains of severe abdominal pain .    Objective: Vital signs in last 24 hours: Filed Vitals:   07/23/12 2202 07/23/12 2242 07/24/12 0000 07/24/12 0400  BP: 130/52     Pulse: 82 84    Temp: 98.9 F (37.2 C)     TempSrc: Oral     Resp: 18 18 18 18   SpO2: 96% 98% 98% 97%      Intake/Output from previous day: I/O last 3 completed shifts: In: 2100 [P.O.:600; I.V.:1500] Out: -    Intake/Output this shift:     LABORATORY DATA:  Recent Labs  07/21/12 1524  07/22/12 0835 07/23/12 0520 07/24/12 0450  WBC 14.8*  --  10.4 13.0* 14.4*  HGB 11.3*  --  9.8* 9.3* 9.7*  HCT 32.4*  --  28.1* 26.3* 26.3*  PLT 155  --  154 147* 178  NA  --   --  123*  --   --   K  --   --  3.7  --   --   CL  --   --  90*  --   --   CO2  --   --  23  --   --   BUN  --   --  9  --   --   CREATININE 0.70  --  0.65  --   --   GLUCOSE  --   --  164*  --   --   INR  --   < > 1.45 3.94* 1.44  CALCIUM  --   --  8.5  --   --   < > = values in this interval not displayed.  Examination: Neurologically intact Neurovascular intact Sensation intact distally Intact pulses distally Dorsiflexion/Plantar flexion intact Incision: scant drainage No cellulitis present Compartment soft} Pt has pain with palpation of her abdomen.  Assessment:   3 Days Post-Op Procedure(s) (LRB): TOTAL KNEE ARTHROPLASTY (Left) ADDITIONAL DIAGNOSIS:  Hypertension, UTI Preop and Sleep Apnea Concerns for an ileus with KUB showing gas build up  in small intestines.  Plan: PT/OT WBAT, CPM 5/hrs day until ROM 0-90 degrees, then D/C CPM DVT Prophylaxis:  SCDx72hrs, ASA 325 mg BID x 2 weeks DISCHARGE PLAN: Skilled Nursing Facility/Rehab DISCHARGE NEEDS: HHPT, HHRN, CPM, Walker and 3-in-1 comode seat Consult GI with possible NG tube to decompress ileus     Kanav Kazmierczak R 07/24/2012, 8:45 AM

## 2012-07-24 NOTE — Progress Notes (Signed)
Orthopedic Tech Progress Note Patient Details:  Lori Wiggins 06-14-1928 [id number] On cpm at 8:00 pm LLE 0-60 Patient ID: Lori Wiggins, female   DOB: February 08, 1928, 79 y.o.   MRN: [id number]   Lori Wiggins 07/24/2012, 8:06 PM

## 2012-07-24 NOTE — Progress Notes (Signed)
ANTICOAGULATION CONSULT NOTE   Pharmacy Consult for Coumadin Indication: VTE prophylaxis  Patient Measurements: Weight = 60 kg  Baseline INR = 1.10  Medical History: Past Medical History  Diagnosis Date  . Hypertension   . Hypothyroidism   . Sleep apnea     uses CPAP  . GERD (gastroesophageal reflux disease)   . Arthritis   . Breast cancer     left  breast    Assessment: 79 year old female s/p TKR, baseline INR = 1.10. INR supratherapeutic (3.94) on 7/23 and this morning is back down to 1.4. No reversal agents noted, at this point have to question validity of labs yesterday. No bleeding issues noted, possible bowel obstruction per GI. Noted possible plans to transition to bid aspirin at discharge.   Patient now off cipro and on keflex which has much less effect on INR.  Goal of Therapy:  INR = 1.8 to 2.5 Monitor platelets by anticoagulation protocol: Yes   Plan:  1) Warfarin 2mg  tonight  2) Daily INR 3) Coumadin reeducation completed  Sheppard Coil PharmD., BCPS Clinical Pharmacist Pager [phone] 07/24/2012 1:08 PM

## 2012-07-24 NOTE — Progress Notes (Signed)
Orthopedic Tech Progress Note Patient Details:  Lori Wiggins August 20, 1928 [id number] On cpm at 3:10 LLE 60 Patient ID: [name], female   DOB: December 19, 1928, 79 y.o.   MRN: [id number]   Jennye Moccasin 07/24/2012, 3:09 PM

## 2012-07-24 NOTE — Consult Note (Signed)
[facility] Consult: 10:51 AM 07/24/2012   Referring Provider: Turner Daniels Primary Care Physician:  Dr Lacie Scotts Primary Gastroenterologist:  Dr. Markham Jordan in [facility]  Reason for Consultation:  Bowel obstruction.  HPI: Lori Wiggins is an active 79 y.o. female. Uses CPAP for OSA.   Underwent left TKR on 07/21/12.   Post op she has had small amount of vomitting, small BMs, anorexia, abd bloating. She  Denies severe abd pain.  vomiting most of her pos.  Emesis is brown.  KUB  Today showing SBO at distal ileum.   Growing E coli from 7/16 urine clx, currentl abx is Keflex Surgeries include appendectomy and abdominal hysterectomy   Started Reglan this AM, one dose so far.  Fleets enema yesterday. Twice daily Colace.  Taking up to 25 mg oxycodone daily, used 10 mg yesterday.  Also using Robaxin.  No Fentanyl since 7/21.     Past Medical History  Diagnosis Date  . Hypertension   . Hypothyroidism   . Sleep apnea     uses CPAP  . GERD (gastroesophageal reflux disease)   . Arthritis   . Cancer     left  breast    Past Surgical History  Procedure Laterality Date  . Cardiac catheterization      10 years ago  . Breast surgery Left 2012    Lumpectomy  . Tonsillectomy    . Appendectomy    . Abdominal hysterectomy    . Total knee arthroplasty Right 07/21/2012    Dr Turner Daniels  . Total knee arthroplasty Left 07/21/2012    Procedure: TOTAL KNEE ARTHROPLASTY;  Surgeon: Lori Lewandowsky, MD;  Location: [facility] OR;  Service: Orthopedics;  Laterality: Left;    Prior to Admission medications   Medication Sig Start Date End Date Taking? Authorizing Provider  Calcium-Vitamin D-Vitamin K (CALCIUM + D + K PO) Take 1 tablet by mouth daily.   Yes Historical Provider, MD  Cholecalciferol (VITAMIN D3) 5000 UNITS TABS Take 5,000 Units by mouth daily.   Yes Historical Provider, MD  ciprofloxacin (CIPRO) 500 MG tablet Take 500 mg by mouth 2 (two) times daily.   Yes  Historical Provider, MD  Coenzyme Q10 (CO Q-10) 100 MG CAPS Take 100 mg by mouth daily.   Yes Historical Provider, MD  Grape Seed 100 MG CAPS Take 100 mg by mouth daily.   Yes Historical Provider, MD  levothyroxine (SYNTHROID, LEVOTHROID) 50 MCG tablet Take 50 mcg by mouth daily before breakfast.   Yes Historical Provider, MD  Multiple Vitamin (MULTIVITAMIN WITH MINERALS) TABS Take 1 tablet by mouth daily.   Yes Historical Provider, MD  Omega-3 Fatty Acids (OMEGA-3 PO) Take 360 mg by mouth daily.   Yes Historical Provider, MD  valsartan-hydrochlorothiazide (DIOVAN-HCT) 160-25 MG per tablet Take 1 tablet by mouth daily.   Yes Historical Provider, MD  oxyCODONE-acetaminophen (ROXICET) 5-325 MG per tablet Take 1 tablet by mouth every 4 (four) hours as needed for pain. 07/23/12   Lori Lewandowsky, MD  tizanidine (ZANAFLEX) 2 MG capsule Take 2 capsules (4 mg total) by mouth 3 (three) times daily as needed for muscle spasms. 07/23/12   Lori Lewandowsky, MD  warfarin (COUMADIN) 5 MG tablet Take 1 tablet (5 mg total) by mouth daily. 07/23/12   Lori Lewandowsky, MD    Scheduled Meds: . cephALEXin  250 mg Oral Q6H  . docusate sodium  100 mg Oral BID  . irbesartan  150 mg Oral Daily   And  . hydrochlorothiazide  25 mg Oral Daily  . levothyroxine  50 mcg Oral QAC breakfast  . multivitamin with minerals  1 tablet Oral Daily  . warfarin   Does not apply Once  . Warfarin - Pharmacist Dosing Inpatient   Does not apply q1800   Infusions: . dextrose 5 % and 0.45 % NaCl with KCl 20 mEq/L 125 mL/hr at 07/24/12 0512   PRN Meds: acetaminophen, acetaminophen, alum & mag hydroxide-simeth, bisacodyl, diphenhydrAMINE, HYDROmorphone (DILAUDID) injection, menthol-cetylpyridinium, metoCLOPramide (REGLAN) injection, metoCLOPramide, ondansetron (ZOFRAN) IV, ondansetron, oxyCODONE, phenol, sodium phosphate, zolpidem   Allergies as of 07/03/2012  . (Not on File)    History reviewed. No pertinent family history.  History    Social History  . Marital Status: Married    Spouse Name: N/A    Number of Children: N/A  . Years of Education: N/A   Occupational History  . Not on file.   Social History Main Topics  . Smoking status: Never Smoker   . Smokeless tobacco: Never Used  . Alcohol Use: Yes     Comment: social  . Drug Use: No  . Sexually Active: Not Currently   Other Topics Concern  . Not on file   Social History Narrative  . No narrative on file    REVIEW OF SYSTEMS: Constitutional:  Generally no weakness ENT:  No no nose bleeds CV:  No chest pain or palpitations Pulm:  No cough, no dyspnea GI:  Per HPI GU:  Urinating normal amounts, no  Blood in urine.  Heme:  No anemia PTA.    Transfusions:  None in past Neuro:  No headache, no dizziness. No hx CVA Derm:  No rash or sores.  No itching Endocrine:  Thyroid labs every 4 months:  No problems with these Immunization:  Not queried Travel:  Not queried.    PHYSICAL EXAM: Vital signs in last 24 hours: Temp:  [97.8 F (36.6 C)-98.9 F (37.2 C)] 98.9 F (37.2 C) (07/23 2202) Pulse Rate:  [82-85] 84 (07/23 2242) Resp:  [18] 18 (07/24 0400) BP: (129-130)/(52-77) 130/52 mmHg (07/23 2202) SpO2:  [96 %-98 %] 97 % (07/24 0400)  General: Looks young for age, looks tired but not ill. Head:  No swelling of face  Eyes:  No icterus.  Conjunctiva pale Ears:  Slightly HOH  Nose:  No congestion or bleeding Mouth:  Clear, moist, tongue is mid-line Neck:  No mass, no goiter Lungs:  Clear bil.  Breathing unlabored. Heart: RRR.  No MRG Abdomen:  Tight, slightly protuberant.  No tympanitic BS.  Active BS.  Not tender   Rectal: not done   Musc/Skeltl: post surgical swelling and bruising in left knee and leg Extremities:  Edema in left leg  Neurologic:  Oriented x 3.  Inaccurate recall of events, corrected by family.  No tremor.  Moves all 4 limbs Skin:  No rash.  No sores.  Is pale Tattoos:  none Nodes:  No cervical adenopathy   Psych:   Pleasant, alert cooperative.   Intake/Output from previous day: 07/23 0701 - 07/24 0700 In: 600 [P.O.:600] Out: -  Intake/Output this shift:    LAB RESULTS:  Recent Labs  07/22/12 0835 07/23/12 0520 07/24/12 0450  WBC 10.4 13.0* 14.4*  HGB 9.8* 9.3* 9.7*  HCT 28.1* 26.3* 26.3*  PLT 154 147* 178   BMET Lab Results  Component Value Date   NA 123* 07/22/2012   NA 136 07/16/2012   K 3.7 07/22/2012   K 3.8 07/16/2012  CL 90* 07/22/2012   CL 97 07/16/2012   CO2 23 07/22/2012   CO2 27 07/16/2012   GLUCOSE 164* 07/22/2012   GLUCOSE 87 07/16/2012   BUN 9 07/22/2012   BUN 22 07/16/2012   CREATININE 0.65 07/22/2012   CREATININE 0.70 07/21/2012   CREATININE 0.95 07/16/2012   CALCIUM 8.5 07/22/2012   CALCIUM 10.0 07/16/2012   LFT No results found for this basename: PROT, ALBUMIN, AST, ALT, ALKPHOS, BILITOT, BILIDIR, IBILI,  in the last 72 hours PT/INR Lab Results  Component Value Date   INR 1.44 07/24/2012   INR 3.94* 07/23/2012   INR 1.45 07/22/2012   Hepatitis Panel No results found for this basename: HEPBSAG, HCVAB, HEPAIGM, HEPBIGM,  in the last 72 hours C-Diff No components found with this basename: cdiff    Drugs of Abuse  No results found for this basename: labopia, cocainscrnur, labbenz, amphetmu, thcu, labbarb     RADIOLOGY STUDIES: Dg Abd Portable 1v 07/24/2012    Findings: Multiple dilated loops of small bowel are identified measuring up to 4 cm. There is a paucity of colonic bowel gas identified.  No abnormal mass or abnormal calcifications are noted. No bony abnormality is seen.  IMPRESSION: Small bowel obstruction likely in the distal ileum.  CT may be helpful for further evaluation.   Original Report Authenticated By: Alcide Clever, M.D.    ENDOSCOPIC STUDIES: Colonoscopies by Dr Markham Jordan in [place] last was 8 years ago.  He has told her she does not really need to have repeat study at her age.  She has  Had some type of polyp in past.   IMPRESSION: *  Post op ileus  vs SBO. *  S/p left TKR 721. *  Hypothyroidism *  OSA.  Chronic CPAP *  Post op anemia, normocytic.  *  Post op Coumadin DVT prophylaxis.    PLAN: *  ngt *  Npo *  X ray in AM *  Will add Protonix.  *  TSH has been drawn.  All was d/w Dr Leone Payor.    LOS: 3 days   Jennye Moccasin  07/24/2012, 10:51 AM Pager: [phone]     [facility] Attending  I have also seen and assessed the patient and agree with the above note. Ileus vs. SBO but needed NGtube Better after that TSH ok  Also has hyponatremia   Check BMET AM Changed IVF to D5 NS w/ kcl  Iva Boop, MD, [facility] [phone] (pager) 07/24/2012 7:38 PM

## 2012-07-25 ENCOUNTER — Inpatient Hospital Stay (HOSPITAL_COMMUNITY): Payer: Medicare Other

## 2012-07-25 ENCOUNTER — Encounter (HOSPITAL_COMMUNITY): Payer: Self-pay | Admitting: General Surgery

## 2012-07-25 DIAGNOSIS — K560 Paralytic ileus: Secondary | ICD-10-CM

## 2012-07-25 DIAGNOSIS — R109 Unspecified abdominal pain: Secondary | ICD-10-CM

## 2012-07-25 DIAGNOSIS — R110 Nausea: Secondary | ICD-10-CM

## 2012-07-25 DIAGNOSIS — N390 Urinary tract infection, site not specified: Secondary | ICD-10-CM

## 2012-07-25 DIAGNOSIS — IMO0002 Reserved for concepts with insufficient information to code with codable children: Secondary | ICD-10-CM

## 2012-07-25 DIAGNOSIS — K929 Disease of digestive system, unspecified: Secondary | ICD-10-CM

## 2012-07-25 DIAGNOSIS — K5900 Constipation, unspecified: Secondary | ICD-10-CM

## 2012-07-25 DIAGNOSIS — E871 Hypo-osmolality and hyponatremia: Secondary | ICD-10-CM

## 2012-07-25 DIAGNOSIS — I10 Essential (primary) hypertension: Secondary | ICD-10-CM

## 2012-07-25 LAB — BASIC METABOLIC PANEL
BUN: 12 mg/dL (ref 6–23)
CO2: 21 mEq/L (ref 19–32)
Calcium: 7.9 mg/dL — ABNORMAL LOW (ref 8.4–10.5)
Creatinine, Ser: 0.46 mg/dL — ABNORMAL LOW (ref 0.50–1.10)
Glucose, Bld: 107 mg/dL — ABNORMAL HIGH (ref 70–99)

## 2012-07-25 MED ORDER — WARFARIN SODIUM 3 MG PO TABS
3.0000 mg | ORAL_TABLET | Freq: Once | ORAL | Status: DC
  Filled 2012-07-25: qty 1

## 2012-07-25 MED ORDER — ENOXAPARIN SODIUM 30 MG/0.3ML ~~LOC~~ SOLN
30.0000 mg | Freq: Two times a day (BID) | SUBCUTANEOUS | Status: DC
  Administered 2012-07-25 – 2012-07-30 (×10): 30 mg via SUBCUTANEOUS
  Filled 2012-07-25 (×13): qty 0.3

## 2012-07-25 MED ORDER — LEVOTHYROXINE SODIUM 100 MCG IV SOLR
25.0000 ug | Freq: Every day | INTRAVENOUS | Status: DC
  Administered 2012-07-25 – 2012-07-29 (×5): 25 ug via INTRAVENOUS
  Filled 2012-07-25 (×6): qty 5

## 2012-07-25 MED ORDER — SODIUM CHLORIDE 0.9 % IV SOLN
INTRAVENOUS | Status: DC
  Administered 2012-07-25 (×3): via INTRAVENOUS

## 2012-07-25 NOTE — Progress Notes (Signed)
Pt. Refused cpap due to having the NG tube. Pt. States she will resume cpap once her tube is removed.

## 2012-07-25 NOTE — Progress Notes (Signed)
PT Cancellation Note  Patient Details Name: Lori Wiggins MRN: [id number] DOB: 12-Dec-1928   Cancelled Treatment:    Reason Eval/Treat Not Completed: Medical issues which prohibited therapy.  RN requesting to hold therapy due to low Na level & confusion.     [name] 07/25/2012, 10:06 AM   [name], PTA [phone] 07/25/2012

## 2012-07-25 NOTE — Progress Notes (Signed)
Lab called with a  Low Na level of 114. Left a message for Minerva Areola PA for Dr Turner Daniels. Spoke to Dr Turner Daniels, new order to change fluids to NS at 125cc and hospitalists  Will be consulted.

## 2012-07-25 NOTE — Consult Note (Signed)
I have seen and examined the patient and agree with the assessment and plans. I still suspect this is ileus, but if it persists, will get a CT scan to make sure nothing else other than adhesions is causing this.  Niki Payment A. Magnus Ivan  MD, FACS

## 2012-07-25 NOTE — Progress Notes (Signed)
ANTICOAGULATION CONSULT NOTE   Pharmacy Consult for Coumadin Indication: VTE prophylaxis  Patient Measurements: Weight: 60 kg  Baseline INR = 1.10  Medical History: Past Medical History  Diagnosis Date  . Hypertension   . Hypothyroidism   . Sleep apnea     uses CPAP  . GERD (gastroesophageal reflux disease)   . Arthritis   . Breast cancer     left  breast    Assessment: 79 year old female s/p TKR, baseline INR = 1.10. INR subratherapeutic at 1.52 (was 3.94 on 7/23 with no reversal agent, likely lab error). No bleeding issues noted, SBO vs ileus noted. Noted possible plans to transition to bid aspirin at discharge.   Goal of Therapy:  INR 1.8 to 2.5 Monitor platelets by anticoagulation protocol: Yes   Plan:  1) Warfarin 3 mg PO tonight  2) Daily INR 3) Monitor for signs/symptoms of bleeding  [facility], [address].D., BCPS Clinical Pharmacist Pager: [phone] 07/25/2012 12:02 PM

## 2012-07-25 NOTE — Progress Notes (Signed)
PATIENT ID: Lori Wiggins  MRN: [id number]  DOB/AGE:  77/05/1928 / 77 y.o.  4 Days Post-Op Procedure(s) (LRB): TOTAL KNEE ARTHROPLASTY (Left)    PROGRESS NOTE Subjective: Patient is alert, oriented, no Nausea, no Vomiting, yes passing gas, no Bowel Movement. Not Taking PO. Denies SOB, Chest or Calf Pain. Using Incentive Spirometer, PAS in place. Ambulate wbat, CPM 0-60. Patient reports pain as moderate  .    Objective: Vital signs in last 24 hours: Filed Vitals:   07/24/12 0400 07/24/12 1248 07/24/12 2117 07/25/12 0557  BP:  134/66 136/63 155/67  Pulse:  79 77 79  Temp:  97.7 F (36.5 C) 98.2 F (36.8 C) 97.8 F (36.6 C)  TempSrc:  Oral    Resp: 18 16 18 18   SpO2: 97% 98% 95% 96%      Intake/Output from previous day: I/O last 3 completed shifts: In: 1500 [I.V.:1500] Out: 1000 [Urine:700; Emesis/NG output:300]   Intake/Output this shift:     LABORATORY DATA:  Recent Labs  07/22/12 0835 07/23/12 0520 07/24/12 0450  WBC 10.4 13.0* 14.4*  HGB 9.8* 9.3* 9.7*  HCT 28.1* 26.3* 26.3*  PLT 154 147* 178  NA 123*  --   --   K 3.7  --   --   CL 90*  --   --   CO2 23  --   --   BUN 9  --   --   CREATININE 0.65  --   --   GLUCOSE 164*  --   --   INR 1.45 3.94* 1.44  CALCIUM 8.5  --   --     Examination: Neurologically intact Neurovascular intact Sensation intact distally Intact pulses distally Dorsiflexion/Plantar flexion intact Incision: scant drainage Compartment soft Patients abdomen is distended}  Assessment:   4 Days Post-Op Procedure(s) (LRB): TOTAL KNEE ARTHROPLASTY (Left) ADDITIONAL DIAGNOSIS:  Ileus, Hypertension, UTI Preop and Sleep Apnea Post of Small Bowel Obstruction- followed by [facility]  Plan: PT/OT WBAT, CPM 5/hrs day until ROM 0-90 degrees, then D/C CPM DVT Prophylaxis:  SCDx72hrs, ASA 325 mg BID x 2 weeks DISCHARGE PLAN: Skilled Nursing Facility/Rehab once cleared by PT and GI DISCHARGE NEEDS: HHPT, HHRN, CPM, Walker and 3-in-1 comode  seat Pt is being followed by GI.  Awaiting KUB this AM.  Continue with GI plan of NG tube and NPO.      Lori Wiggins 07/25/2012, 7:21 AM

## 2012-07-25 NOTE — Progress Notes (Signed)
Physical Therapy Treatment Patient Details Name: Lori Wiggins MRN: [id number] DOB: 06/21/1928 Today's Date: 07/25/2012 Time: 5643-3295 PT Time Calculation (min): 27 min  PT Assessment / Plan / Recommendation       Clinical Impression Pt making progress with mobility this afternoon.  Able to increase ambulation distance.  Slightly confused although able to answer questions appropriately.        Follow Up Recommendations  SNF     Does the patient have the potential to tolerate intense rehabilitation     Barriers to Discharge        Equipment Recommendations  None recommended by PT    Recommendations for Other Services    Frequency 7X/week   Progress towards PT Goals Progress towards PT goals: Progressing toward goals  Plan Current plan remains appropriate    Precautions / Restrictions Precautions Precautions: Fall;Knee Restrictions LLE Weight Bearing: Weight bearing as tolerated       Mobility  Bed Mobility Bed Mobility: Supine to Sit;Sitting - Scoot to Edge of Bed;Sit to Supine Supine to Sit: 4: Min assist;HOB elevated;With rails Sitting - Scoot to Edge of Bed: 4: Min guard Sit to Supine: 4: Min assist;HOB flat Details for Bed Mobility Assistance: (A) for LLE, to lift shoulders/trunk to sitting upright, & to lift LE's back into bed.  Transfers Transfers: Sit to Stand;Stand to Sit Sit to Stand: 4: Min assist;With upper extremity assist;From bed Stand to Sit: 4: Min guard;With upper extremity assist;To bed Details for Transfer Assistance: cues for hand placement & safe technique.  (A) to achieve standing & balance.  Ambulation/Gait Ambulation/Gait Assistance: 4: Min guard Ambulation Distance (Feet): 40 Feet Assistive device: Rolling walker Ambulation/Gait Assistance Details: Cues for sequencing, body positioning inside RW.   Gait Pattern: Step-through pattern;Decreased stride length;Decreased hip/knee flexion - left;Decreased weight shift to left Stairs:  No Wheelchair Mobility Wheelchair Mobility: No    Exercises Total Joint Exercises Ankle Circles/Pumps: AROM;Both;15 reps Quad Sets: AROM;Both;10 reps Hip ABduction/ADduction: AAROM;Strengthening;Left;10 reps Straight Leg Raises: AAROM;Strengthening;Left;10 reps Goniometric ROM: 70 degrees flexion AAROM   PT Goals (current goals can now be found in the care plan section) Acute Rehab PT Goals PT Goal Formulation: With patient Time For Goal Achievement: 08/04/12 Potential to Achieve Goals: Good  Visit Information  Last PT Received On: 07/25/12 Assistance Needed: +1    Subjective Data      Cognition  Cognition Arousal/Alertness: Awake/alert Behavior During Therapy: WFL for tasks assessed/performed Overall Cognitive Status: Within Functional Limits for tasks assessed    Balance     End of Session PT - End of Session Equipment Utilized During Treatment: Gait belt Activity Tolerance: Patient tolerated treatment well Patient left: in bed;in CPM;with call bell/phone within reach Nurse Communication: Mobility status   GP     [name] 07/25/2012, 2:06 PM   [name], PTA [phone] 07/25/2012

## 2012-07-25 NOTE — Progress Notes (Signed)
[facility] Daily Rounding Note 07/25/2012, 8:46 AM  SUBJECTIVE:       400 cc output so far since NGT placed.  No abdominal pain.  No stools. Confused this AM with Na level down to 114.  Had no urine out put after 8 PM, then had 700 cc of urine on I/O cath but foley not placed.    OBJECTIVE:         Vital signs in last 24 hours:    Temp:  [97.7 F (36.5 C)-98.2 F (36.8 C)] 97.8 F (36.6 C) (07/25 0557) Pulse Rate:  [77-79] 79 (07/25 0557) Resp:  [16-18] 18 (07/25 0800) BP: (134-155)/(63-67) 155/67 mmHg (07/25 0557) SpO2:  [95 %-98 %] 96 % (07/25 0557) Last BM Date: 07/19/12 General: relaxed, comfortable   Heart: RRR Chest: diminished BS in right base Abdomen: tight, slightly distended, hypoactive BS  Extremities: swelling at left knee c/w surgery Neuro/Psych:  Difficulty following commands but does eventually follow commands with max prodding.  Not somnolent.  She is alert but speaking gibberish.    Intake/Output from previous day: 07/24 0701 - 07/25 0700 In: 3000 [I.V.:3000] Out: 1000 [Urine:700; Emesis/NG output:300]  Intake/Output this shift:   Scheduled Meds: .  ceFAZolin (ANCEF) IV  1 g Intravenous Q8H  . docusate sodium  100 mg Oral BID  . levothyroxine  50 mcg Oral QAC breakfast  . pantoprazole (PROTONIX) IV  40 mg Intravenous Q24H  . warfarin   Does not apply Once  . Warfarin - Pharmacist Dosing Inpatient   Does not apply q1800   Continuous Infusions: . sodium chloride 125 mL/hr at 07/25/12 0826  . dextrose 5 % and 0.9 % NaCl with KCl 20 mEq/L 125 mL/hr at 07/25/12 0600   PRN Meds:.acetaminophen, acetaminophen, alum & mag hydroxide-simeth, bisacodyl, diphenhydrAMINE, HYDROmorphone (DILAUDID) injection, menthol-cetylpyridinium, ondansetron (ZOFRAN) IV, ondansetron, oxyCODONE, phenol, sodium phosphate, zolpidem  Lab Results:  Recent Labs  07/23/12 0520 07/24/12 0450  WBC 13.0* 14.4*  HGB 9.3* 9.7*  HCT 26.3* 26.3*  PLT 147* 178   BMET  Recent  Labs  07/25/12 0645  NA 114*  K 3.8  CL 83*  CO2 21  GLUCOSE 148*  BUN 12  CREATININE 0.54  CALCIUM 8.1*    PT/INR  Recent Labs  07/24/12 0450 07/25/12 0645  LABPROT 17.2* 17.9*  INR 1.44 1.52*    Studies/Results: Dg Abd Portable 1v 07/25/2012   *RADIOLOGY REPORT*  Clinical Data: Abdominal pain, small bowel obstruction  PORTABLE ABDOMEN - 1 VIEW  Comparison: July 24, 2012.  Findings: There is continued presence of dilated small bowel loops seen which is not significantly changed compared to prior exam.  No colonic dilatation is noted.  Stool and gas are noted in the rectum. Nasogastric tube tip is seen in the expected position of the proximal stomach.  No abnormal calcifications are noted.  IMPRESSION: No significant change in dilated small bowel loops noted on prior exam, most consistent with distal small bowel obstruction. Nasogastric tube tip seen in proximal stomach.   Original Report Authenticated By: Lupita Raider.,  M.D.   ASSESMENT: *  SBO.  Persists.  NGT in place *  Hyponatremia. Confusion.  Hospitalist to see.  NS in place.   *  Warfarin DVT prophylaxis post TKR   PLAN: *  Will ask surgeon to evaluate.  I called consult.  *  Place foley.  I ordered.  *  Hospitalist to see and manage medically. *  Ok to  give narcotics prn for pain *  Should we hold Coumadin?  Meds will need to be converted to IV formulations.    LOS: 4 days   Jennye Moccasin  07/25/2012, 8:46 AM Pager: [phone]  Events.results noted. Patient seen and examined, assessed.  [facility] will order CT if she fails to progress. IM Dx/Tx low Na.  Will defer to surgery at this point re: management and follow loosely.  Dr. Loreta Ave on call this weekend  Iva Boop, MD, [facility] [phone] (pager) 07/25/2012 3:42 PM

## 2012-07-25 NOTE — Consult Note (Signed)
Reason for Consult:possible small bowel obstruction Referring Physician: Dr. Hendricks Milo  HPI: Lori Wiggins is a 79 year old female with a history of hypothyroidism, hypertension, OSA, GERD, breast cancer, abdominal hysterectomy, appendectomy and arthritis.  She underwent a left knee arthroplasty on 07/21/12  Post operatively her diet was advanced, she was passing gas.  On POD #3 she developed some mild nausea.  Her husband states that she had a small bowel movement (incontinent) 2 days ago and none since.  She also complained of abdominal pain.  A KUB was obtained and showed a small bowel obstruction in the distal ileum, recommend CT to further evaluation.  She had an NGT placed and was made NPO.  She has had out.  She denies abdominal pain.  Her daughter reports that the patient develops nausea with ice chips. Reports passing gas, but no BM yet.  She is not taking much narcotics, last dose was on 23rd.  She has been ambulating with physical therapy, but moving slowly.    The patient was confused earlier today.  Mr. Gwynne states that this is new as of this morning.  She had a foley placed last night due to urinary retention.  Her sodium this morning was 114.  Hospitalist has been consulted to evaluate the patient.    At present time, she does not appear confused.  She was alert and oriented to person place and time during my exam.  She was answering questions appropriately.  Her daughter was at bedside.  Past Medical History  Diagnosis Date  . Hypertension   . Hypothyroidism   . Sleep apnea     uses CPAP  . GERD (gastroesophageal reflux disease)   . Arthritis   . Breast cancer     left  breast    Past Surgical History  Procedure Laterality Date  . Cardiac catheterization      10 years ago  . Breast surgery Left 2012    Lumpectomy  . Tonsillectomy    . Appendectomy    . Abdominal hysterectomy    . Total knee arthroplasty Right 07/21/2012    Dr Turner Daniels  . Total knee arthroplasty Left  07/21/2012    Procedure: TOTAL KNEE ARTHROPLASTY;  Surgeon: Nestor Lewandowsky, MD;  Location: [facility] OR;  Service: Orthopedics;  Laterality: Left;    History reviewed. No pertinent family history.  Social History:  reports that she has never smoked. She has never used smokeless tobacco. She reports that  drinks alcohol. She reports that she does not use illicit drugs.  Allergies:  Allergies  Allergen Reactions  . Sulfa Antibiotics Other (See Comments)    Unknown    . Nsaids Rash    Pt. does not want to take at all.    Medications: {medication reviewed  Results for orders placed during the hospital encounter of 07/21/12 (from the past 48 hour(s))  CBC     Status: Abnormal   Collection Time    07/24/12  4:50 AM      Result Value Range   WBC 14.4 (*) 4.0 - 10.5 K/uL   RBC 3.28 (*) 3.87 - 5.11 MIL/uL   Hemoglobin 9.7 (*) 12.0 - 15.0 g/dL   HCT 45.4 (*) 09.8 - 11.9 %   MCV 80.2  78.0 - 100.0 fL   MCH 29.6  26.0 - 34.0 pg   MCHC 36.9 (*) 30.0 - 36.0 g/dL   RDW 14.7  82.9 - 56.2 %   Platelets 178  150 - 400  K/uL  PROTIME-INR     Status: Abnormal   Collection Time    07/24/12  4:50 AM      Result Value Range   Prothrombin Time 17.2 (*) 11.6 - 15.2 seconds   INR 1.44  0.00 - 1.49  TSH     Status: None   Collection Time    07/24/12 12:00 PM      Result Value Range   TSH 0.843  0.350 - 4.500 uIU/mL  PROTIME-INR     Status: Abnormal   Collection Time    07/25/12  6:45 AM      Result Value Range   Prothrombin Time 17.9 (*) 11.6 - 15.2 seconds   INR 1.52 (*) 0.00 - 1.49  BASIC METABOLIC PANEL     Status: Abnormal   Collection Time    07/25/12  6:45 AM      Result Value Range   Sodium 114 (*) 135 - 145 mEq/L   Comment: CRITICAL RESULT CALLED TO, READ BACK BY AND VERIFIED WITH:     K.CORUM,RN 07/25/12 07527 BY BSLADE   Potassium 3.8  3.5 - 5.1 mEq/L   Chloride 83 (*) 96 - 112 mEq/L   CO2 21  19 - 32 mEq/L   Glucose, Bld 148 (*) 70 - 99 mg/dL   BUN 12  6 - 23 mg/dL   Creatinine,  Ser 4.54  0.50 - 1.10 mg/dL   Calcium 8.1 (*) 8.4 - 10.5 mg/dL   GFR calc non Af Amer 86 (*) >90 mL/min   GFR calc Af Amer >90  >90 mL/min   Comment:            The eGFR has been calculated     using the CKD EPI equation.     This calculation has not been     validated in all clinical     situations.     eGFR's persistently     <90 mL/min signify     possible Chronic Kidney Disease.  GLUCOSE, CAPILLARY     Status: Abnormal   Collection Time    07/25/12  9:04 AM      Result Value Range   Glucose-Capillary 127 (*) 70 - 99 mg/dL   Comment 1 Notify RN      Dg Abd Portable 1v  07/25/2012   *RADIOLOGY REPORT*  Clinical Data: Abdominal pain, small bowel obstruction  PORTABLE ABDOMEN - 1 VIEW  Comparison: July 24, 2012.  Findings: There is continued presence of dilated small bowel loops seen which is not significantly changed compared to prior exam.  No colonic dilatation is noted.  Stool and gas are noted in the rectum. Nasogastric tube tip is seen in the expected position of the proximal stomach.  No abnormal calcifications are noted.  IMPRESSION: No significant change in dilated small bowel loops noted on prior exam, most consistent with distal small bowel obstruction. Nasogastric tube tip seen in proximal stomach.   Original Report Authenticated By: Lupita Raider.,  M.D.   Dg Abd Portable 1v  07/24/2012   *RADIOLOGY REPORT*  Clinical Data: Nausea and vomiting, abdominal distension  PORTABLE ABDOMEN - 1 VIEW  Comparison: None.  Findings: Multiple dilated loops of small bowel are identified measuring up to 4 cm. There is a paucity of colonic bowel gas identified.  No abnormal mass or abnormal calcifications are noted. No bony abnormality is seen.  IMPRESSION: Small bowel obstruction likely in the distal ileum.  CT may be helpful for further evaluation.  Original Report Authenticated By: Alcide Clever, M.D.    Review of Systems  Constitutional: Negative for fever and chills.  HENT: Negative  for sore throat and neck pain.        Neck swelling since yesterday  Respiratory: Negative for shortness of breath.   Cardiovascular: Negative for chest pain, palpitations and leg swelling.  Gastrointestinal: Positive for nausea and constipation. Negative for heartburn, vomiting and abdominal pain.  Genitourinary:       Indwelling catheter   Neurological: Negative for dizziness, seizures and weakness.   Blood pressure 154/81, pulse 81, temperature 98 F (36.7 C), temperature source Oral, resp. rate 18, SpO2 96.00%. Physical Exam  Constitutional: She is oriented to person, place, and time. She appears well-developed and well-nourished. No distress.  Neck:  Neck swelling and anterior cervical adenopathy  Cardiovascular: Normal rate, regular rhythm, normal heart sounds and intact distal pulses.  Exam reveals no gallop and no friction rub.   No murmur heard. Respiratory: Effort normal and breath sounds normal. No respiratory distress. She has no wheezes. She exhibits no tenderness.  GI: She exhibits distension. She exhibits no mass. There is no tenderness. There is no guarding.  +BS round and distended, no acute abdomen  Neurological: She is alert and oriented to person, place, and time.  Skin: Skin is warm and dry. She is not diaphoretic.  Left leg swelling, mild c/w surgery  Psychiatric: She has a normal mood and affect. Her behavior is normal.    Assessment/Plan: Ileus versus small bowel obstruction Continue with conservative treatment.  She is passing gas now. out of NGT since it was placed yesterday afternoon.  It would not be unreasonable to clamp her NGT and he how she does.  Mobilize patient as tolerated and minimize narcotic use.   -okay to continue with coumadin -we will continue to follow  Hyponatremia and Neck swelling-Hospitalist service to evaluate today.   Dezarae Mcclaran ANP-BC  07/25/2012, 10:43 AM

## 2012-07-25 NOTE — Consult Note (Signed)
[facility] History and Physical  Nuria Phebus BMW:[id number] DOB: 1928-05-05 DOA: 07/21/2012  Referring physician: Orthopedics   PCP: Pcp Not In System   Chief Complaint: Reason for consult altered mental status and hyponatremia  HPI:  Lori Wiggins is a 79 year old female with a history of hypothyroidism, hypertension, OSA, GERD, breast cancer, abdominal hysterectomy, appendectomy and arthritis. She underwent a left knee arthroplasty on 07/21/12 Post operatively her diet was advanced, and she was unable to tolerate it because of postoperative ileus. antihypertensive medications resumed, she was also started on quarter normal and half normal saline postoperatively. Sodium is 114 today, and patient was confused earlier today..On postoperative day 3 the patient developed some mild nausea. KUB was obtained that showed small bowel obstruction in the distal ileum.  The patient apparently has developed some postoperative ileus and currently has an NG tube in place with 400 cc of output since yesterday afternoon.. Both surgical and GI consultations have been obtained.  Postoperatively she also developed urinary retention and a Foley catheter was placed. Postoperatively the patient has also been initiated on Coumadin for anticoagulation      Review of Systems: negative for the following  Constitutional: Denies fever, chills, diaphoresis, appetite change and fatigue.  HEENT: Denies photophobia, eye pain, redness, hearing loss, ear pain, congestion, sore throat, rhinorrhea, sneezing, mouth sores, trouble swallowing, neck pain, neck stiffness and tinnitus.  Respiratory: Denies SOB, DOE, cough, chest tightness, and wheezing.  Cardiovascular: Denies chest pain, palpitations and leg swelling.  Gastrointestinal: Denies nausea, vomiting, abdominal pain, diarrhea, constipation, blood in stool and abdominal distention.  Genitourinary: Denies dysuria, urgency, frequency, hematuria, flank pain and  difficulty urinating.  Musculoskeletal: Denies myalgias, back pain, joint swelling, arthralgias and gait problem.  Skin: Denies pallor, rash and wound.  Neurological: Denies dizziness, seizures, syncope, weakness, light-headedness, numbness and headaches.  Hematological: Denies adenopathy. Easy bruising, personal or family bleeding history  Psychiatric/Behavioral: Denies suicidal ideation, mood changes, confusion, nervousness, sleep disturbance and agitation       Past Medical History  Diagnosis Date  . Hypertension   . Hypothyroidism   . Sleep apnea     uses CPAP  . GERD (gastroesophageal reflux disease)   . Arthritis   . Breast cancer     left  breast     Past Surgical History  Procedure Laterality Date  . Cardiac catheterization      10 years ago  . Breast surgery Left 2012    Lumpectomy  . Tonsillectomy    . Appendectomy    . Abdominal hysterectomy    . Total knee arthroplasty Right 07/21/2012    Dr Turner Daniels  . Total knee arthroplasty Left 07/21/2012    Procedure: TOTAL KNEE ARTHROPLASTY;  Surgeon: Nestor Lewandowsky, MD;  Location: [facility] OR;  Service: Orthopedics;  Laterality: Left;      Social History:  reports that she has never smoked. She has never used smokeless tobacco. She reports that  drinks alcohol. She reports that she does not use illicit drugs.    Allergies  Allergen Reactions  . Sulfa Antibiotics Other (See Comments)    Unknown    . Nsaids Rash    Pt. does not want to take at all.    History reviewed. No pertinent family history.   Prior to Admission medications   Medication Sig Start Date End Date Taking? Authorizing Provider  Calcium-Vitamin D-Vitamin K (CALCIUM + D + K PO) Take 1 tablet by mouth daily.   Yes Historical Provider, MD  Cholecalciferol (VITAMIN D3) 5000 UNITS TABS Take 5,000 Units by mouth daily.   Yes Historical Provider, MD  ciprofloxacin (CIPRO) 500 MG tablet Take 500 mg by mouth 2 (two) times daily.   Yes Historical Provider, MD   Coenzyme Q10 (CO Q-10) 100 MG CAPS Take 100 mg by mouth daily.   Yes Historical Provider, MD  Grape Seed 100 MG CAPS Take 100 mg by mouth daily.   Yes Historical Provider, MD  levothyroxine (SYNTHROID, LEVOTHROID) 50 MCG tablet Take 50 mcg by mouth daily before breakfast.   Yes Historical Provider, MD  Multiple Vitamin (MULTIVITAMIN WITH MINERALS) TABS Take 1 tablet by mouth daily.   Yes Historical Provider, MD  Omega-3 Fatty Acids (OMEGA-3 PO) Take 360 mg by mouth daily.   Yes Historical Provider, MD  valsartan-hydrochlorothiazide (DIOVAN-HCT) 160-25 MG per tablet Take 1 tablet by mouth daily.   Yes Historical Provider, MD  oxyCODONE-acetaminophen (ROXICET) 5-325 MG per tablet Take 1 tablet by mouth every 4 (four) hours as needed for pain. 07/23/12   Nestor Lewandowsky, MD  tizanidine (ZANAFLEX) 2 MG capsule Take 2 capsules (4 mg total) by mouth 3 (three) times daily as needed for muscle spasms. 07/23/12   Nestor Lewandowsky, MD  warfarin (COUMADIN) 5 MG tablet Take 1 tablet (5 mg total) by mouth daily. 07/23/12   Nestor Lewandowsky, MD     Physical Exam: Filed Vitals:   07/25/12 0000 07/25/12 0557 07/25/12 0800 07/25/12 0909  BP:  155/67  154/81  Pulse:  79  81  Temp:  97.8 F (36.6 C)  98 F (36.7 C)  TempSrc:      Resp: 18 18 18 18   SpO2: 96% 96%  96%     Constitutional: Vital signs reviewed. Patient is a well-developed and well-nourished in no acute distress and cooperative with exam. Alert and oriented x3.  Head: Normocephalic and atraumatic  Ear: TM normal bilaterally  Mouth: no erythema or exudates, MMM  Eyes: PERRL, EOMI, conjunctivae normal, No scleral icterus.  Neck: Supple, Trachea midline normal ROM, No JVD, mass, thyromegaly, or carotid bruit present.  Cardiovascular: RRR, S1 normal, S2 normal, no MRG, pulses symmetric and intact bilaterally  Pulmonary/Chest: CTAB, no wheezes, rales, or rhonchi  Abdominal: Soft. Non-tender, non-distended, bowel sounds are normal, no masses,  organomegaly, or guarding present.  GU: no CVA tenderness Musculoskeletal: She exhibits tenderness (Left knee pain Ext: no edema and no cyanosis, pulses palpable bilaterally (DP and PT)  Hematology: no cervical, inginal, or axillary adenopathy.  Neurological: A&O x3, Strenght is normal and symmetric bilaterally, cranial nerve II-XII are grossly intact, no focal motor deficit, sensory intact to light touch bilaterally.  Skin: Warm, dry and intact. No rash, cyanosis, or clubbing.  Psychiatric: Normal mood and affect. speech and behavior is normal. Judgment and thought content normal. Cognition and memory are normal.       Labs on Admission:    Basic Metabolic Panel:  Recent Labs Lab 07/21/12 1524 07/22/12 0835 07/25/12 0645  NA  --  123* 114*  K  --  3.7 3.8  CL  --  90* 83*  CO2  --  23 21  GLUCOSE  --  164* 148*  BUN  --  9 12  CREATININE 0.70 0.65 0.54  CALCIUM  --  8.5 8.1*   Liver Function Tests: No results found for this basename: AST, ALT, ALKPHOS, BILITOT, PROT, ALBUMIN,  in the last 168 hours No results found for this basename: LIPASE, AMYLASE,  in the last 168 hours No results found for this basename: AMMONIA,  in the last 168 hours CBC:  Recent Labs Lab 07/21/12 1524 07/22/12 0835 07/23/12 0520 07/24/12 0450  WBC 14.8* 10.4 13.0* 14.4*  HGB 11.3* 9.8* 9.3* 9.7*  HCT 32.4* 28.1* 26.3* 26.3*  MCV 83.7 83.1 81.9 80.2  PLT 155 154 147* 178   Cardiac Enzymes: No results found for this basename: CKTOTAL, CKMB, CKMBINDEX, TROPONINI,  in the last 168 hours  BNP (last 3 results) No results found for this basename: PROBNP,  in the last 8760 hours    CBG:  Recent Labs Lab 07/25/12 0904  GLUCAP 127*    Radiological Exams on Admission: Dg Abd Portable 1v  07/25/2012   *RADIOLOGY REPORT*  Clinical Data: Abdominal pain, small bowel obstruction  PORTABLE ABDOMEN - 1 VIEW  Comparison: July 24, 2012.  Findings: There is continued presence of dilated small  bowel loops seen which is not significantly changed compared to prior exam.  No colonic dilatation is noted.  Stool and gas are noted in the rectum. Nasogastric tube tip is seen in the expected position of the proximal stomach.  No abnormal calcifications are noted.  IMPRESSION: No significant change in dilated small bowel loops noted on prior exam, most consistent with distal small bowel obstruction. Nasogastric tube tip seen in proximal stomach.   Original Report Authenticated By: Lupita Raider.,  M.D.   Dg Abd Portable 1v  07/24/2012   *RADIOLOGY REPORT*  Clinical Data: Nausea and vomiting, abdominal distension  PORTABLE ABDOMEN - 1 VIEW  Comparison: None.  Findings: Multiple dilated loops of small bowel are identified measuring up to 4 cm. There is a paucity of colonic bowel gas identified.  No abnormal mass or abnormal calcifications are noted. No bony abnormality is seen.  IMPRESSION: Small bowel obstruction likely in the distal ileum.  CT may be helpful for further evaluation.   Original Report Authenticated By: Alcide Clever, M.D.    EKG: Independently reviewed.    Assessment/Plan Principal Problem:   Arthritis of knee, left Active Problems:   Ileus, postoperative   Hyponatremia   Hyponatremia Likely a combination of dehydration, usage of hypotonic solutions and IV fluids, hydrochlorothiazide, Switch to normal saline at 75 cc an hour Discontinue HCTZ We'll obtain urine osmolality serum osmolality Repeat BMP tonight Also check a cortisol and TSH Free fluid restriction   Altered mental status Most likely in the setting of hyponatremia , No gross focal neurologic deficits   Ileus versus small bowel obstruction  Continue with conservative treatment. She is passing gas now. out of NGT since it was placed yesterday afternoon   Code Status:   full Family Communication: bedside Disposition Plan: admit   Time spent: 70 mins   [facility] Pager  [phone]  If 7PM-7AM, please contact night-coverage [email or web address] Password [facility] 07/25/2012, 11:37 AM

## 2012-07-26 ENCOUNTER — Inpatient Hospital Stay (HOSPITAL_COMMUNITY): Payer: Medicare Other

## 2012-07-26 ENCOUNTER — Encounter (HOSPITAL_COMMUNITY): Payer: Self-pay | Admitting: Radiology

## 2012-07-26 DIAGNOSIS — R142 Eructation: Secondary | ICD-10-CM

## 2012-07-26 DIAGNOSIS — N390 Urinary tract infection, site not specified: Secondary | ICD-10-CM

## 2012-07-26 DIAGNOSIS — A498 Other bacterial infections of unspecified site: Secondary | ICD-10-CM

## 2012-07-26 DIAGNOSIS — B9620 Unspecified Escherichia coli [E. coli] as the cause of diseases classified elsewhere: Secondary | ICD-10-CM | POA: Diagnosis present

## 2012-07-26 DIAGNOSIS — R112 Nausea with vomiting, unspecified: Secondary | ICD-10-CM

## 2012-07-26 DIAGNOSIS — I10 Essential (primary) hypertension: Secondary | ICD-10-CM

## 2012-07-26 DIAGNOSIS — R143 Flatulence: Secondary | ICD-10-CM

## 2012-07-26 DIAGNOSIS — R141 Gas pain: Secondary | ICD-10-CM

## 2012-07-26 LAB — COMPREHENSIVE METABOLIC PANEL
CO2: 22 mEq/L (ref 19–32)
Calcium: 8.1 mg/dL — ABNORMAL LOW (ref 8.4–10.5)
Creatinine, Ser: 0.53 mg/dL (ref 0.50–1.10)
GFR calc Af Amer: >90 mL/min (ref >90)
GFR calc non Af Amer: 86 mL/min — ABNORMAL LOW (ref >90)
Glucose, Bld: 97 mg/dL (ref 70–99)
Total Bilirubin: 0.5 mg/dL (ref 0.3–1.2)

## 2012-07-26 MED ORDER — FLEET ENEMA 7-19 GM/118ML RE ENEM
1.0000 | ENEMA | Freq: Once | RECTAL | Status: AC
  Administered 2012-07-26: 1 via RECTAL

## 2012-07-26 MED ORDER — POTASSIUM CHLORIDE 10 MEQ/100ML IV SOLN
10.0000 meq | Freq: Once | INTRAVENOUS | Status: AC
  Administered 2012-07-26: 10 meq via INTRAVENOUS
  Filled 2012-07-26: qty 100

## 2012-07-26 MED ORDER — IOHEXOL 300 MG/ML  SOLN
100.0000 mL | Freq: Once | INTRAMUSCULAR | Status: AC | PRN
  Administered 2012-07-26: 100 mL via INTRAVENOUS

## 2012-07-26 MED ORDER — POTASSIUM CHLORIDE 10 MEQ/100ML IV SOLN
10.0000 meq | INTRAVENOUS | Status: AC
  Administered 2012-07-26 (×6): 10 meq via INTRAVENOUS
  Filled 2012-07-26 (×6): qty 100

## 2012-07-26 MED ORDER — SODIUM CHLORIDE 0.9 % IV SOLN
INTRAVENOUS | Status: DC
  Administered 2012-07-26 – 2012-07-28 (×3): via INTRAVENOUS
  Administered 2012-07-28: 75 mL/h via INTRAVENOUS
  Administered 2012-07-29: 08:00:00 via INTRAVENOUS

## 2012-07-26 MED ORDER — DOCUSATE SODIUM 50 MG/5ML PO LIQD
100.0000 mg | Freq: Two times a day (BID) | ORAL | Status: DC
  Administered 2012-07-26 – 2012-07-28 (×4): 100 mg via ORAL
  Filled 2012-07-26 (×9): qty 10

## 2012-07-26 MED ORDER — IOHEXOL 300 MG/ML  SOLN
25.0000 mL | INTRAMUSCULAR | Status: AC
  Administered 2012-07-26 (×2): 25 mL via ORAL

## 2012-07-26 NOTE — Progress Notes (Signed)
Pt. Refuses CPAP at this time due to NG tube being in place. Pt. Was made aware to let RT know anytime during the night if she changed her mind & decided to wear CPAP.

## 2012-07-26 NOTE — Progress Notes (Signed)
Patient ID: Lori Wiggins, female   DOB: 1928/09/18, 79 y.o.   MRN: [id number]   LOS: 5 days   Subjective: Nauseated last night but no emesis. Denies flatus. No abd pain but feels full and uncomfortable.   Objective: Vital signs in last 24 hours: Temp:  [97.8 F (36.6 C)-98.5 F (36.9 C)] 98 F (36.7 C) (07/26 0641) Pulse Rate:  [76-82] 76 (07/26 0641) Resp:  [18] 18 (07/26 0641) BP: (135-152)/(59-77) 152/77 mmHg (07/26 0641) SpO2:  [95 %-97 %] 97 % (07/26 0641) Last BM Date: 07/19/12   NGT: 22ml/24h   Laboratory  BMET  Recent Labs  07/25/12 1225 07/26/12 0250  NA 114* 118*  K 4.7 3.4*  CL 83* 88*  CO2 21 22  GLUCOSE 107* 97  BUN 12 13  CREATININE 0.46* 0.53  CALCIUM 7.9* 8.1*    Physical Exam General appearance: alert and no distress Resp: clear to auscultation bilaterally Cardio: regular rate and rhythm GI: Soft, mild diffuse TTP, +BS   Assessment/Plan: Ileus vs SBO -- Repeat abd x-rays, try enema to decompress from below. Continue NGT. ? CT abd/pelvis.   Freeman Caldron, PA-C Pager: [phone]   07/26/2012

## 2012-07-26 NOTE — Progress Notes (Signed)
Orthopedic Tech Progress Note Patient Details:  Lori Wiggins August 02, 1928 [id number] On cpm at 8:00 pm LLE 0-65. Patient ID: Lori Wiggins, female   DOB: 23-Nov-1928, 79 y.o.   MRN: [id number]   Jennye Moccasin 07/26/2012, 8:03 PM

## 2012-07-26 NOTE — Progress Notes (Signed)
Physical Therapy Treatment Patient Details Name: Lori Wiggins MRN: [id number] DOB: [birth year] Today's Date: 07/26/2012 Time: 4098-1191 PT Time Calculation (min): 17 min  PT Assessment / Plan / Recommendation  History of Present Illness Patient is an 79 yo female s/p Lt. TKA.       PT Comments   Patient required some encouragement to ambulate this AM. Patient appears discouraged at this time and shows lack of energy. Patient did ambulate and is showing some progress. Continue with current POC  Follow Up Recommendations  SNF     Does the patient have the potential to tolerate intense rehabilitation     Barriers to Discharge        Equipment Recommendations  None recommended by PT    Recommendations for Other Services    Frequency 7X/week   Progress towards PT Goals Progress towards PT goals: Progressing toward goals  Plan Current plan remains appropriate    Precautions / Restrictions Precautions Precautions: Fall;Knee Restrictions Weight Bearing Restrictions: Yes LLE Weight Bearing: Weight bearing as tolerated   Pertinent Vitals/Pain no apparent distress     Mobility  Bed Mobility Bed Mobility: Not assessed Transfers Sit to Stand: With upper extremity assist;4: Min guard;From chair/3-in-1 Stand to Sit: 4: Min guard;With upper extremity assist;To chair/3-in-1 Details for Transfer Assistance: cues for hand placement & safe technique.  Patient able to stand without posterior lean this morning Ambulation/Gait Ambulation/Gait Assistance: 4: Min guard Ambulation Distance (Feet): 40 Feet Assistive device: Rolling walker Ambulation/Gait Assistance Details: Cues to stay within RW and to stand upright Gait Pattern: Step-through pattern;Decreased stride length;Decreased hip/knee flexion - left;Decreased weight shift to left Gait velocity: slow    Exercises Total Joint Exercises Quad Sets: AROM;Both;10 reps Heel Slides: AAROM;Left;10 reps Hip ABduction/ADduction:  AAROM;Strengthening;Left;10 reps Straight Leg Raises: AAROM;Strengthening;Left;10 reps Long Arc Quad: AAROM;Left;10 reps;Seated   PT Diagnosis:    PT Problem List:   PT Treatment Interventions:     PT Goals (current goals can now be found in the care plan section)    Visit Information  Last PT Received On: 07/26/12 Assistance Needed: +1 History of Present Illness: Patient is an 79 yo female s/p Lt. TKA.    Subjective Data      Cognition  Cognition Arousal/Alertness: Awake/alert Behavior During Therapy: WFL for tasks assessed/performed Overall Cognitive Status: Within Functional Limits for tasks assessed    Balance     End of Session PT - End of Session Equipment Utilized During Treatment: Gait belt Activity Tolerance: Patient tolerated treatment well Patient left: with call bell/phone within reach;in chair Nurse Communication: Mobility status CPM Left Knee CPM Left Knee: Off   GP     Lori Wiggins 07/26/2012, 11:42 AM 07/26/2012 Lori Wiggins PTA [phone] pager [phone] office

## 2012-07-26 NOTE — Progress Notes (Signed)
Cross cover for [facility] Subjective: Patient continues to have abdominal soreness byut denies having frank abdominal pain. Has not pass any flatus today. She claims she had some belching yesterday.   Objective: Vital signs in last 24 hours: Temp:  [97.8 F (36.6 C)-98 F (36.7 C)] 98 F (36.7 C) (07/26 0641) Pulse Rate:  [76-79] 76 (07/26 0641) Resp:  [18] 18 (07/26 0641) BP: (135-152)/(59-77) 152/77 mmHg (07/26 0641) SpO2:  [96 %-97 %] 97 % (07/26 0641) Last BM Date: 07/19/12  Intake/Output from previous day: 07/25 0701 - 07/26 0700 In: -  Out: 1000 [Urine:800; Emesis/NG output:200] Intake/Output this shift:   General appearance: alert, cooperative, fatigued, no distress and pale Resp: clear to auscultation bilaterally Cardio: regular rate and rhythm, S1, S2 normal, no murmur, click, rub or gallop GI: soft with absent bowel sounds; no masses,  no organomegaly Extremities: extremities normal, atraumatic, no cyanosis or edema  Lab Results:  Recent Labs  07/24/12 0450  WBC 14.4*  HGB 9.7*  HCT 26.3*  PLT 178   BMET  Recent Labs  07/25/12 0645 07/25/12 1225 07/26/12 0250  NA 114* 114* 118*  K 3.8 4.7 3.4*  CL 83* 83* 88*  CO2 21 21 22   GLUCOSE 148* 107* 97  BUN 12 12 13   CREATININE 0.54 0.46* 0.53  CALCIUM 8.1* 7.9* 8.1*   LFT  Recent Labs  07/26/12 0250  PROT 5.2*  ALBUMIN 2.1*  AST 24  ALT 14  ALKPHOS 63  BILITOT 0.5   PT/INR  Recent Labs  07/24/12 0450 07/25/12 0645  LABPROT 17.2* 17.9*  INR 1.44 1.52*   Studies/Results: Dg Abd Portable 1v  07/25/2012   *RADIOLOGY REPORT*  Clinical Data: Abdominal pain, small bowel obstruction  PORTABLE ABDOMEN - 1 VIEW  Comparison: July 24, 2012.  Findings: There is continued presence of dilated small bowel loops seen which is not significantly changed compared to prior exam.  No colonic dilatation is noted.  Stool and gas are noted in the rectum. Nasogastric tube tip is seen in the expected position of the  proximal stomach.  No abnormal calcifications are noted.  IMPRESSION: No significant change in dilated small bowel loops noted on prior exam, most consistent with distal small bowel obstruction. Nasogastric tube tip seen in proximal stomach.   Original Report Authenticated By: Lupita Raider.,  M.D.    Medications: I have reviewed the patient's current medications.  Assessment/Plan: 1) Post-op ileus: will wait to see films from today. As per my discussion with Dr. Magnus Ivan, we will let surgery manage the care on this patient as there is not much from a GI standpoint that can be done at this time.   LOS: 5 days   Dajsha Massaro 07/26/2012, 3:11 PM

## 2012-07-26 NOTE — Progress Notes (Addendum)
[facility] PROGRESS NOTE  Lori Wiggins ZOX:[id number] DOB: [birth year] DOA: 07/21/2012 PCP: Pcp Not In System  Assessment/Plan:  Hyponatremia:  Secondary to hypotonic IVF and HCTZ. Cortisol and TSH ok. Slight improvement today. Continue saline.  Hypothyroidism    Ileus, postoperative    E. coli UTI (urinary tract infection), fluoroquinolone resistant: on ancef    Essential hypertension, benign, controlled  HPI/Subjective: Frustrated and uncomfortable  Objective: Filed Vitals:   07/25/12 1300 07/25/12 1557 07/25/12 2031 07/26/12 0641  BP: 140/60  135/59 152/77  Pulse: 82  79 76  Temp: 98.5 F (36.9 C)  97.8 F (36.6 C) 98 F (36.7 C)  TempSrc: Oral  Oral Oral  Resp: 18 18 18 18   SpO2: 95%  96% 97%    Intake/Output Summary (Last 24 hours) at 07/26/12 1654 Last data filed at 07/26/12 0939  Gross per 24 hour  Intake      0 ml  Output    700 ml  Net   -700 ml   There were no vitals filed for this visit.  Exam:   General:  Alert, oriented  Cardiovascular: RRR without MFR  Respiratory: CTA without WRR  Abdomen: S, NT, ND  Musculoskeletal: no cce  Data Reviewed: Basic Metabolic Panel:  Recent Labs Lab 07/21/12 1524 07/22/12 0835 07/25/12 0645 07/25/12 1225 07/26/12 0250  NA  --  123* 114* 114* 118*  K  --  3.7 3.8 4.7 3.4*  CL  --  90* 83* 83* 88*  CO2  --  23 21 21 22   GLUCOSE  --  164* 148* 107* 97  BUN  --  9 12 12 13   CREATININE 0.70 0.65 0.54 0.46* 0.53  CALCIUM  --  8.5 8.1* 7.9* 8.1*   Liver Function Tests:  Recent Labs Lab 07/26/12 0250  AST 24  ALT 14  ALKPHOS 63  BILITOT 0.5  PROT 5.2*  ALBUMIN 2.1*   No results found for this basename: LIPASE, AMYLASE,  in the last 168 hours No results found for this basename: AMMONIA,  in the last 168 hours CBC:  Recent Labs Lab 07/21/12 1524 07/22/12 0835 07/23/12 0520 07/24/12 0450  WBC 14.8* 10.4 13.0* 14.4*  HGB 11.3* 9.8* 9.3* 9.7*  HCT 32.4* 28.1* 26.3* 26.3*  MCV  83.7 83.1 81.9 80.2  PLT 155 154 147* 178   Cardiac Enzymes: No results found for this basename: CKTOTAL, CKMB, CKMBINDEX, TROPONINI,  in the last 168 hours BNP (last 3 results) No results found for this basename: PROBNP,  in the last 8760 hours CBG:  Recent Labs Lab 07/25/12 0904  GLUCAP 127*    No results found for this or any previous visit (from the past 240 hour(s)).   Studies: Dg Abd 2 Views  07/26/2012   *RADIOLOGY REPORT*  Clinical Data: Abdominal pain with nausea and vomiting.  No bowel movement for 2 days.  ABDOMEN - 2 VIEW  Comparison: Abdominal radiographs 07/25/2012 and 07/24/2012.  Findings: Nasogastric tube projects into the proximal stomach.  The small bowel distension appears minimally improved.  There is some gas within the transverse colon and rectum.  Decubitus view demonstrates multiple air-fluid levels and no evidence of pneumoperitoneum.  IMPRESSION: Minimal NG decompression of the small bowel.  The bowel gas pattern remains most consistent with a distal partial small bowel obstruction.   Original Report Authenticated By: Carey Bullocks, M.D.   Dg Abd Portable 1v  07/25/2012   *RADIOLOGY REPORT*  Clinical Data: Abdominal pain, small bowel obstruction  PORTABLE ABDOMEN - 1 VIEW  Comparison: July 24, 2012.  Findings: There is continued presence of dilated small bowel loops seen which is not significantly changed compared to prior exam.  No colonic dilatation is noted.  Stool and gas are noted in the rectum. Nasogastric tube tip is seen in the expected position of the proximal stomach.  No abnormal calcifications are noted.  IMPRESSION: No significant change in dilated small bowel loops noted on prior exam, most consistent with distal small bowel obstruction. Nasogastric tube tip seen in proximal stomach.   Original Report Authenticated By: Lupita Raider.,  M.D.    Scheduled Meds: . docusate sodium  100 mg Oral BID  . enoxaparin (LOVENOX) injection  30 mg Subcutaneous  Q12H  . iohexol  25 mL Oral Q1 Hr x 2  . levothyroxine  25 mcg Intravenous QAC breakfast  . pantoprazole (PROTONIX) IV  40 mg Intravenous Q24H  . potassium chloride  10 mEq Intravenous Q1 Hr x 6   Continuous Infusions: . sodium chloride 100 mL/hr at 07/26/12 1651  . dextrose 5 % and 0.9 % NaCl with KCl 20 mEq/L 125 mL/hr at 07/25/12 0600   Time spent: 25 min  Hiilei Gerst L  [facility] Pager [phone]. If 7PM-7AM, please contact night-coverage at [email or web address], password [facility] 07/26/2012, 4:54 PM  LOS: 5 days

## 2012-07-26 NOTE — Progress Notes (Signed)
PATIENT ID: Lori Wiggins  MRN: [id number]  DOB/AGE:  77-Dec-1930 / 77 y.o.  5 Days Post-Op Procedure(s) (LRB): TOTAL KNEE ARTHROPLASTY (Left)    PROGRESS NOTE Subjective: Patient is alert, oriented, no Nausea, no Vomiting, not passing gas, no Bowel Movement. Pt not taking po due to NG tube. Denies SOB, Chest or Calf Pain. Using Incentive Spirometer, PAS in place. Ambulate WBAT, and pt walked in room and in hall yesterday, CPM 0-40 Patient reports pain as moderate  .    Objective: Vital signs in last 24 hours: Filed Vitals:   07/25/12 1300 07/25/12 1557 07/25/12 2031 07/26/12 0641  BP: 140/60  135/59 152/77  Pulse: 82  79 76  Temp: 98.5 F (36.9 C)  97.8 F (36.6 C) 98 F (36.7 C)  TempSrc: Oral  Oral Oral  Resp: 18 18 18 18   SpO2: 95%  96% 97%      Intake/Output from previous day: I/O last 3 completed shifts: In: 1500 [I.V.:1500] Out: 1700 [Urine:1500; Emesis/NG output:200]   Intake/Output this shift:     LABORATORY DATA:  Recent Labs  07/24/12 0450  07/25/12 0645 07/25/12 0904 07/25/12 1225 07/26/12 0250  WBC 14.4*  --   --   --   --   --   HGB 9.7*  --   --   --   --   --   HCT 26.3*  --   --   --   --   --   PLT 178  --   --   --   --   --   NA  --   < > 114*  --  114* 118*  K  --   < > 3.8  --  4.7 3.4*  CL  --   < > 83*  --  83* 88*  CO2  --   < > 21  --  21 22  BUN  --   < > 12  --  12 13  CREATININE  --   < > 0.54  --  0.46* 0.53  GLUCOSE  --   < > 148*  --  107* 97  GLUCAP  --   --   --  127*  --   --   INR 1.44  --  1.52*  --   --   --   CALCIUM  --   < > 8.1*  --  7.9* 8.1*  < > = values in this interval not displayed.  Examination: Neurologically intact Neurovascular intact Sensation intact distally Intact pulses distally Dorsiflexion/Plantar flexion intact Incision: dressing C/D/I and no drainage No cellulitis present Compartment soft Pt has continued abdominal distention.}  Assessment:   5 Days Post-Op Procedure(s) (LRB): TOTAL KNEE  ARTHROPLASTY (Left) ADDITIONAL DIAGNOSIS:  Ileus, Hypertension, UTI Preop and Sleep Apnea Critcal sodium level at 118 trending up from 114 yesterday. Low potassium level at 3.4 down form 4.7 Pot of small bowel obstruction, followed by [facility].  Plan: PT/OT WBAT, CPM 5/hrs day until ROM 0-90 degrees, then D/C CPM DVT Prophylaxis:  SCDx72hrs, ASA 325 mg BID x 2 weeks DISCHARGE PLAN: Skilled Nursing Facility/Rehab  When cleared by [facility] and [facility] DISCHARGE NEEDS: HHPT, HHRN, CPM, Walker and 3-in-1 comode seat Pt followed by [facility] for her critcal sodium and potassium levels. Continue to follow treatment plans of [facility] and [facility] for Small bowel obstruction and electrolyte levels. Pt taken off Coumadin and placed on lovenox due to  GI motility.    Iliani Vejar R 07/26/2012, 7:59 AM

## 2012-07-26 NOTE — Progress Notes (Addendum)
Critical sodium level of 118 called in from the lab this am.  Lenny Pastel NP on call for [facility].  Patient also with potassium level of 3.4, down from 4.7 yesterday.  Order for one time run of IV potassium.  Nursing will continue to monitor.

## 2012-07-26 NOTE — Progress Notes (Signed)
Patient interviewed and examined. I agree with the assessment and treatment plan outlined by Earney Hamburg, PA.  She remains distended and has not passed flatus or stool. She does not have any significant pain or cramps but she is uncomfortable.NG output is moderate, bilious, not excessive.Clinically there is no evidence for compromised bowel.  Hypokalemia noted.  Check two-view abdominal x-ray now. If this still looks like SBO then we will proceed with CT scan today. Correct hypokalemia. See orders.   Angelia Mould. Derrell Lolling, M.D., [facility] General and Minimally invasive Surgery Breast and Colorectal Surgery Office:   [phone] Pager:   [phone]

## 2012-07-27 LAB — BASIC METABOLIC PANEL
BUN: 12 mg/dL (ref 6–23)
BUN: 11 mg/dL (ref 6–23)
CO2: 22 mEq/L (ref 19–32)
Calcium: 8.2 mg/dL — ABNORMAL LOW (ref 8.4–10.5)
Chloride: 89 mEq/L — ABNORMAL LOW (ref 96–112)
Glucose, Bld: 85 mg/dL (ref 70–99)
Glucose, Bld: 76 mg/dL (ref 70–99)
Potassium: 4 mEq/L (ref 3.5–5.1)
Potassium: 4 mEq/L (ref 3.5–5.1)
Sodium: 118 mEq/L — CL (ref 135–145)

## 2012-07-27 LAB — CBC
HCT: 25 % — ABNORMAL LOW (ref 36.0–46.0)
Hemoglobin: 9 g/dL — ABNORMAL LOW (ref 12.0–15.0)
MCH: 29.2 pg (ref 26.0–34.0)
RBC: 3.08 MIL/uL — ABNORMAL LOW (ref 3.87–5.11)

## 2012-07-27 MED ORDER — FLEET ENEMA 7-19 GM/118ML RE ENEM: 1.0000 | ENEMA | Freq: Once | RECTAL | Status: DC

## 2012-07-27 MED ORDER — SODIUM CHLORIDE 3 % IV SOLN
INTRAVENOUS | Status: DC
  Administered 2012-07-27: 14:00:00 via INTRAVENOUS
  Filled 2012-07-27 (×4): qty 500

## 2012-07-27 MED ORDER — MILK AND MOLASSES ENEMA
Freq: Once | RECTAL | Status: AC
  Administered 2012-07-27: 14:00:00 via RECTAL
  Filled 2012-07-27: qty 250

## 2012-07-27 NOTE — Progress Notes (Signed)
I have interviewed and examined this patient this afternoon. I agree with the assessment and treatment plan outlined by Earney Hamburg, PA.  The patient is feeling better. She states her abdomen is less tight and she is passing flatus and had one small liquid stool. Examination reveals the abdomen is still distended but less so. She now has active bowel sounds which is negative.  Hyperkalemia corrected with potassium 4.0. Sodium 118 still significant and could be contributing to ileus.  Continue NG. Milk &  molasses enema ordered. Reassess with abdominal x-ray and exam tomorrow.   Angelia Mould. Derrell Lolling, M.D., [facility] General and Minimally invasive Surgery Breast and Colorectal Surgery Office:   [phone] Pager:   [phone]

## 2012-07-27 NOTE — Progress Notes (Signed)
[facility] PROGRESS NOTE  Lori Wiggins NWG:[id number] DOB: [birth year] DOA: 07/21/2012 PCP: Pcp Not In System  Assessment/Plan:  Hyponatremia: improvement has stalled. Unable to take oral medications, like demeclocycline or tolvaptan due to PSBO/ileus. Will give 3% saline for 3 hours at 50 cc an hour and recheck. Patient is a bit confused this morning, and lethargic. Hyponatremia likely contributing.  Hypothyroidism    Ileus, postoperative    E. coli UTI (urinary tract infection), fluoroquinolone resistant: on ancef    Essential hypertension, benign, controlled  HPI/Subjective: Frustrated and uncomfortable  Objective: Filed Vitals:   07/25/12 2031 07/26/12 0641 07/26/12 2129 07/27/12 0655  BP: 135/59 152/77 145/65 142/58  Pulse: 79 76 78 78  Temp: 97.8 F (36.6 C) 98 F (36.7 C) 98.7 F (37.1 C) 98.2 F (36.8 C)  TempSrc: Oral Oral    Resp: 18 18 18 18   SpO2: 96% 97% 98% 96%    Intake/Output Summary (Last 24 hours) at 07/27/12 1129 Last data filed at 07/27/12 0656  Gross per 24 hour  Intake   1360 ml  Output   2200 ml  Net   -840 ml   There were no vitals filed for this visit.  Exam:   General:  In chair. More groggy today and slightly confused.  Cardiovascular: RRR without MFR  Respiratory: CTA without WRR  Abdomen: S, NT, ND  Musculoskeletal: no cce  Data Reviewed: Basic Metabolic Panel:  Recent Labs Lab 07/22/12 0835 07/25/12 0645 07/25/12 1225 07/26/12 0250 07/27/12 0625  NA 123* 114* 114* 118* 118*  K 3.7 3.8 4.7 3.4* 4.0  CL 90* 83* 83* 88* 88*  CO2 23 21 21 22 22   GLUCOSE 164* 148* 107* 97 85  BUN 9 12 12 13 12   CREATININE 0.65 0.54 0.46* 0.53 0.54  CALCIUM 8.5 8.1* 7.9* 8.1* 8.2*   Liver Function Tests:  Recent Labs Lab 07/26/12 0250  AST 24  ALT 14  ALKPHOS 63  BILITOT 0.5  PROT 5.2*  ALBUMIN 2.1*   No results found for this basename: LIPASE, AMYLASE,  in the last 168 hours No results found for this basename:  AMMONIA,  in the last 168 hours CBC:  Recent Labs Lab 07/21/12 1524 07/22/12 0835 07/23/12 0520 07/24/12 0450 07/27/12 0625  WBC 14.8* 10.4 13.0* 14.4* 13.7*  HGB 11.3* 9.8* 9.3* 9.7* 9.0*  HCT 32.4* 28.1* 26.3* 26.3* 25.0*  MCV 83.7 83.1 81.9 80.2 81.2  PLT 155 154 147* 178 240   Cardiac Enzymes: No results found for this basename: CKTOTAL, CKMB, CKMBINDEX, TROPONINI,  in the last 168 hours BNP (last 3 results) No results found for this basename: PROBNP,  in the last 8760 hours CBG:  Recent Labs Lab 07/25/12 0904  GLUCAP 127*    No results found for this or any previous visit (from the past 240 hour(s)).   Studies: Ct Abdomen Pelvis W Contrast  07/26/2012   *RADIOLOGY REPORT*  Clinical Data: Abdominal pain, no bowel movements, evaluate for small bowel obstruction  CT ABDOMEN AND PELVIS WITH CONTRAST  Technique:  Multidetector CT imaging of the abdomen and pelvis was performed following the standard protocol during bolus administration of intravenous contrast.  Contrast: OMNIPAQUE IOHEXOL 300 MG/ML  SOLN  Comparison: Abdominal radiograph - earlier same day  Findings:  Examination is degraded secondary to the patient's arms overlying the upper abdomen.  Enteric contrast extends to the level of the mid small bowel. There is moderate distension of multiple upstream loops of small bowel  with index looped within the right mid hemiabdomen measuring approximately 3.4 cm in diameter (image 47, series 3).  There is an apparent transition point of the obstruction within the mid aspect of the abdomen (image 36, series 3; coronal image 63 of 64) with associated decompression of the more distal / downstream loops of small bowel.  There is a small amount of residual stool within the colon which is otherwise decompressed.  No pneumoperitoneum, pneumatosis or portal venous gas.  Enteric tube terminates within the mid aspect of the stomach.  There is a small amount of fluid adjacent to the  caudal aspect of the right lobe of the liver as well as within the pelvic cul-de- sac.  Normal hepatic contour. No discrete hepatic lesions.  Normal appearance of the gallbladder.  No intra or extrahepatic biliary ductal dilatation.  There is symmetric enhancement and excretion of the bilateral kidneys.  No definite renal lesions.  No definite renal stones on the post contrast examination.  No urinary obstruction or perinephric stranding.  Normal appearance of the bilateral adrenal glands, pancreas and spleen.  Normal caliber of the abdominal aorta.  The major branch vessels of the abdominal aorta appear patent on this non CTA examination. 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  No definite retroperitoneal, mesenteric, pelvic or inguinal lymphadenopathy.  Post hysterectomy.  No discrete adnexal lesion.  The urinary bladder is decompressed with Foley catheter.  There is a small amount of iatrogenic air within the urinary bladder.  Limited visualization of the lower thorax demonstrates small bilateral pleural effusions with associated dependent opacities, right greater than left.  Cardiomegaly.  No pericardial effusion.  No acute or aggressive osseous abnormalities.  Mild scoliotic curvature of the thoracolumbar spine, possibly positional.  Surgical clips within the left breast.  IMPRESSION:  1.   Findings compatible with small bowel obstruction with transition point located within the mid aspect of the abdomen.  The etiology of the obstruction is not depicted on this examination and thus presumably secondary to adhesions.  No evidence of perforation.  2.  Small amount of intra-abdominal ascites - the majority of fluid located adjacent to the liver and within the pelvic cul-de-sac.  No evidence of discrete/drainable intra-abdominal abscess. 3.  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   Original Report Authenticated By: Tacey Ruiz, MD   Dg Abd 2 Views  07/26/2012   *RADIOLOGY REPORT*  Clinical Data: Abdominal pain with nausea and vomiting.  No bowel movement for 2 days.  ABDOMEN - 2 VIEW  Comparison: Abdominal radiographs 07/25/2012 and 07/24/2012.  Findings: Nasogastric tube projects into the proximal stomach.  The small bowel distension appears minimally improved.  There is some gas within the transverse colon and rectum.  Decubitus view demonstrates multiple air-fluid levels and no evidence of pneumoperitoneum.  IMPRESSION: Minimal NG decompression of the small bowel.  The bowel gas pattern remains most consistent with a distal partial small bowel obstruction.   Original Report Authenticated By: Carey Bullocks, M.D.    Scheduled Meds: . docusate  100 mg Oral BID  . enoxaparin (LOVENOX) injection  30 mg Subcutaneous Q12H  . levothyroxine  25 mcg Intravenous QAC breakfast  . pantoprazole (PROTONIX) IV  40 mg Intravenous Q24H   Continuous Infusions: . sodium chloride 100 mL/hr at 07/26/12 1651  . sodium  chloride (hypertonic)     Time spent: 35 min  Oni Dietzman L  [facility] Pager [phone]. If 7PM-7AM, please contact night-coverage at [email or web address], password [facility] 07/27/2012, 11:29 AM  LOS: 6 days

## 2012-07-27 NOTE — Progress Notes (Signed)
PATIENT ID: Lori Wiggins  MRN: [id number]  DOB/AGE:  [birth year] / 79 y.o.  6 Days Post-Op Procedure(s) (LRB): TOTAL KNEE ARTHROPLASTY (Left)    PROGRESS NOTE Subjective: Patient is alert, oriented, no Nausea, no Vomiting, not passing gas, no Bowel Movement. Not Taking PO. Denies SOB, Chest or Calf Pain. Using Incentive Spirometer, PAS in place. Ambulate WBAT, Pt up in room once yesterday, CPM 0-60 Patient reports pain as mild.  She is in better spirits today and is up talking.  Pt is moving knee on her own.  Objective: Vital signs in last 24 hours: Filed Vitals:   07/25/12 2031 07/26/12 0641 07/26/12 2129 07/27/12 0655  BP: 135/59 152/77 145/65 142/58  Pulse: 79 76 78 78  Temp: 97.8 F (36.6 C) 98 F (36.7 C) 98.7 F (37.1 C) 98.2 F (36.8 C)  TempSrc: Oral Oral    Resp: 18 18 18 18   SpO2: 96% 97% 98% 96%      Intake/Output from previous day: I/O last 3 completed shifts: In: 1360 [I.V.:1300; Other:60] Out: 2900 [Urine:2400; Emesis/NG output:325; Other:175]   Intake/Output this shift:     LABORATORY DATA:  Recent Labs  07/25/12 0645 07/25/12 0904  07/26/12 0250 07/27/12 0625  WBC  --   --   --   --  13.7*  HGB  --   --   --   --  9.0*  HCT  --   --   --   --  25.0*  PLT  --   --   --   --  240  NA 114*  --   < > 118* 118*  K 3.8  --   < > 3.4* 4.0  CL 83*  --   < > 88* 88*  CO2 21  --   < > 22 22  BUN 12  --   < > 13 12  CREATININE 0.54  --   < > 0.53 0.54  GLUCOSE 148*  --   < > 97 85  GLUCAP  --  127*  --   --   --   INR 1.52*  --   --   --   --   CALCIUM 8.1*  --   < > 8.1* 8.2*  < > = values in this interval not displayed.  Examination: Neurologically intact Neurovascular intact Sensation intact distally Intact pulses distally Dorsiflexion/Plantar flexion intact Incision: no drainage No cellulitis present Compartment soft}  Pt has minimal pain with palpation of her abdomen.    Assessment:   6 Days Post-Op Procedure(s) (LRB): TOTAL KNEE  ARTHROPLASTY (Left) ADDITIONAL DIAGNOSIS:  Ileus, Hypertension, UTI Preop and Sleep Apnea  Post op Small Bowel obstruction.  Followed by GI and surgery with recent CT. Hypokalemia - improved, trending up. Hyponatremia - improved, followed by [facility]  Plan: PT/OT WBAT, CPM 5/hrs day until ROM 0-90 degrees, then D/C CPM Pt encouraged to get up with PT and walk if possible. DVT Prophylaxis:  SCD, Lovenox DISCHARGE PLAN: Skilled Nursing Facility/Rehab DISCHARGE NEEDS: HHPT, HHRN, CPM, Walker and 3-in-1 comode seat Continue to follow treatment plans of GI and Surgery for SBO and electrolytes.      Deane Melick R 07/27/2012, 8:29 AM

## 2012-07-27 NOTE — Progress Notes (Signed)
Patient ID: Lori Wiggins, female   DOB: 1928/09/05, 79 y.o.   MRN: [id number]   LOS: 6 days   Subjective: Feels a bit better today. No to little nausea. Has passed gas a few times and had a small liquid BM while walking this morning.   Objective: Vital signs in last 24 hours: Temp:  [98.2 F (36.8 C)-98.7 F (37.1 C)] 98.2 F (36.8 C) (07/27 0655) Pulse Rate:  [78] 78 (07/27 0655) Resp:  [18] 18 (07/27 0655) BP: (142-145)/(58-65) 142/58 mmHg (07/27 0655) SpO2:  [96 %-98 %] 96 % (07/27 0655) Last BM Date: 07/19/12   NGT: 390ml/24h   Laboratory  CBC  Recent Labs  07/27/12 0625  WBC 13.7*  HGB 9.0*  HCT 25.0*  PLT 240   BMET  Recent Labs  07/26/12 0250 07/27/12 0625  NA 118* 118*  K 3.4* 4.0  CL 88* 88*  CO2 22 22  GLUCOSE 97 85  BUN 13 12  CREATININE 0.53 0.54  CALCIUM 8.1* 8.2*     Physical Exam General appearance: alert, no distress and mild somnolent Resp: clear to auscultation bilaterally Cardio: regular rate and rhythm GI: normal findings: bowel sounds normal and soft, non-tender   Assessment/Plan: SBO -- Repeat enema today. Encouraged by small clinical improvement, repeat abd x-rays in am. Continue NPO w/ice chips, NGT to LIS.    Freeman Caldron, PA-C Pager: [phone] General Trauma PA Pager: [phone]   07/27/2012

## 2012-07-27 NOTE — Progress Notes (Signed)
Physical Therapy Treatment Patient Details Name: Lori Wiggins MRN: [id number] DOB: [birth year] Today's Date: 07/27/2012 Time: 4098-1191 PT Time Calculation (min): 34 min  PT Assessment / Plan / Recommendation  History of Present Illness Patient is an 79 yo female s/p Lt. TKA.      PT Comments   Pt pleasant & motivated to participate in PT session.  Pt with +flatus & passing liquid substance while ambulating.  Assisted pt to bathroom but no BM.    Follow Up Recommendations  SNF     Does the patient have the potential to tolerate intense rehabilitation     Barriers to Discharge        Equipment Recommendations  None recommended by PT    Recommendations for Other Services    Frequency 7X/week   Progress towards PT Goals Progress towards PT goals: Progressing toward goals  Plan Current plan remains appropriate    Precautions / Restrictions Precautions Precautions: Fall;Knee Restrictions LLE Weight Bearing: Weight bearing as tolerated   Pertinent Vitals/Pain Reports appropriate Lt knee pain with WBing.      Mobility  Bed Mobility Bed Mobility: Not assessed Transfers Transfers: Sit to Stand;Stand to Sit Sit to Stand: 4: Min guard;With upper extremity assist;With armrests;From chair/3-in-1 Stand to Sit: 4: Min guard;With upper extremity assist;With armrests;To chair/3-in-1 Details for Transfer Assistance: Performed 5x's throughout session.  Cues to reinforce safest hand placement Ambulation/Gait Ambulation/Gait Assistance: 4: Min guard Ambulation Distance (Feet): 30 Feet Assistive device: Rolling walker Ambulation/Gait Assistance Details: distanct limited due to pt passing flatus & liquid.  Assisted pt to bathroom.   Gait Pattern: Step-to pattern;Decreased step length - right;Decreased hip/knee flexion - left;Decreased hip/knee flexion - right;Antalgic;Decreased weight shift to left Gait velocity: slow Stairs: No Wheelchair Mobility Wheelchair Mobility: No     Exercises Total Joint Exercises Ankle Circles/Pumps: AROM;Both;15 reps Quad Sets: AROM;Both;10 reps Heel Slides: AAROM;Strengthening;Left;10 reps Hip ABduction/ADduction: AAROM;Strengthening;Left;10 reps Straight Leg Raises: AAROM;Strengthening;Left;10 reps Long Arc Quad: AROM;Strengthening;Left;10 reps Knee Flexion: AROM;Left;10 reps;Seated     PT Goals (current goals can now be found in the care plan section) Acute Rehab PT Goals PT Goal Formulation: With patient Time For Goal Achievement: 08/04/12 Potential to Achieve Goals: Good  Visit Information  Last PT Received On: 07/27/12 Assistance Needed: +1 History of Present Illness: Patient is an 79 yo female s/p Lt. TKA.    Subjective Data      Cognition  Cognition Arousal/Alertness: Awake/alert Behavior During Therapy: WFL for tasks assessed/performed Overall Cognitive Status: Within Functional Limits for tasks assessed    Balance     End of Session PT - End of Session Equipment Utilized During Treatment: Gait belt Activity Tolerance: Patient tolerated treatment well Patient left: with call bell/phone within reach;in chair Nurse Communication: Mobility status   GP     [name] 07/27/2012, 10:25 AM    [name], PTA [phone] 07/27/2012

## 2012-07-27 NOTE — Progress Notes (Signed)
Pt. Refuses CPAP at this time due to NG tube being in place. Pt. Was made aware to let RT know anytime during the night if she changed her mind & decided to wear CPAP.  

## 2012-07-28 ENCOUNTER — Inpatient Hospital Stay (HOSPITAL_COMMUNITY): Payer: Medicare Other

## 2012-07-28 DIAGNOSIS — K56609 Unspecified intestinal obstruction, unspecified as to partial versus complete obstruction: Secondary | ICD-10-CM

## 2012-07-28 LAB — BASIC METABOLIC PANEL
BUN: 11 mg/dL (ref 6–23)
CO2: 21 mEq/L (ref 19–32)
Chloride: 95 mEq/L — ABNORMAL LOW (ref 96–112)
Creatinine, Ser: 0.52 mg/dL (ref 0.50–1.10)

## 2012-07-28 NOTE — Progress Notes (Signed)
Feeling better - will clamp NGT and remove if tolerates for 4 hours. abd x-ray shows contrast in colon. Patient examined and I agree with the assessment and plan  Violeta Gelinas, MD, MPH, FACS Pager: [phone]  07/28/2012 2:37 PM

## 2012-07-28 NOTE — Progress Notes (Signed)
Pt. Refuses CPAP at this time. Pt. Stated that she wasn't going to wear CPAP while here. CPAP was removed from pt.'s room & pt. Was made aware to let RT or RN know if she changes her mind & decides to wear CPAP.

## 2012-07-28 NOTE — Progress Notes (Signed)
7 Days Post-Op  Subjective: Doing well. States had a small amount of abdominal pain earlier this morning and they placed the NG tube back to suction, this improved her pain. Had a BM yesterday.  Objective: Vital signs in last 24 hours: Temp:  [98 F (36.7 C)-98.5 F (36.9 C)] 98 F (36.7 C) (07/28 0612) Pulse Rate:  [71-73] 71 (07/28 0612) Resp:  [16-18] 16 (07/28 0800) BP: (137-151)/(59-67) 137/59 mmHg (07/28 0612) SpO2:  [97 %-98 %] 98 % (07/28 0612) Last BM Date: 07/27/12  Intake/Output from previous day: 07/27 0701 - 07/28 0700 In: 2265 [P.O.:240; I.V.:2025] Out: 2250 [Urine:2200; Emesis/NG output:50] Intake/Output this shift:    General appearance: alert, cooperative and no distress GI: soft, non-tender, + bowel sounds  Lab Results:   Recent Labs  07/27/12 0625  WBC 13.7*  HGB 9.0*  HCT 25.0*  PLT 240    BMET  Recent Labs  07/27/12 1430 07/27/12 1906 07/28/12 0500  NA 121* 124* 126*  K 4.0  --  3.5  CL 89*  --  95*  CO2 16*  --  21  GLUCOSE 76  --  77  BUN 11  --  11  CREATININE 0.52  --  0.52  CALCIUM 8.3*  --  8.1*   PT/INR No results found for this basename: LABPROT, INR,  in the last 72 hours   Recent Labs Lab 07/26/12 0250  AST 24  ALT 14  ALKPHOS 63  BILITOT 0.5  PROT 5.2*  ALBUMIN 2.1*     Lipase  No results found for this basename: lipase     Studies/Results: Dg Abd 1 View  07/28/2012   *RADIOLOGY REPORT*  Clinical Data: Follow up small bowel obstruction  ABDOMEN - 1 VIEW  Comparison: 07/26/2012  Findings: There is a nasogastric tube with tip in the proximal stomach.  The side port is just above the GE junction.  There is been interval improvement of previous small bowel dilatation.  Enteric contrast material has progressed into the colon.  No free air.  IMPRESSION:  1.  Improvement small bowel obstruction pattern.   Original Report Authenticated By: Signa Kell, M.D.   Ct Abdomen Pelvis W Contrast  07/26/2012   *RADIOLOGY  REPORT*  Clinical Data: Abdominal pain, no bowel movements, evaluate for small bowel obstruction  CT ABDOMEN AND PELVIS WITH CONTRAST  Technique:  Multidetector CT imaging of the abdomen and pelvis was performed following the standard protocol during bolus administration of intravenous contrast.  Contrast: OMNIPAQUE IOHEXOL 300 MG/ML  SOLN  Comparison: Abdominal radiograph - earlier same day  Findings:  Examination is degraded secondary to the patient's arms overlying the upper abdomen.  Enteric contrast extends to the level of the mid small bowel. There is moderate distension of multiple upstream loops of small bowel with index looped within the right mid hemiabdomen measuring approximately 3.4 cm in diameter (image 47, series 3).  There is an apparent transition point of the obstruction within the mid aspect of the abdomen (image 36, series 3; coronal image 63 of 64) with associated decompression of the more distal / downstream loops of small bowel.  There is a small amount of residual stool within the colon which is otherwise decompressed.  No pneumoperitoneum, pneumatosis or portal venous gas.  Enteric tube terminates within the mid aspect of the stomach.  There is a small amount of fluid adjacent to the caudal aspect of the right lobe of the liver as well as within the pelvic  cul-de- sac.  Normal hepatic contour. No discrete hepatic lesions.  Normal appearance of the gallbladder.  No intra or extrahepatic biliary ductal dilatation.  There is symmetric enhancement and excretion of the bilateral kidneys.  No definite renal lesions.  No definite renal stones on the post contrast examination.  No urinary obstruction or perinephric stranding.  Normal appearance of the bilateral adrenal glands, pancreas and spleen.  Normal caliber of the abdominal aorta.  The major branch vessels of the abdominal aorta appear patent on this non CTA examination. Incidental note is made of an approximate 0.9 cm pseudoaneurysm  within the mid aspect of the splenic artery (axial image 21, series 3, coronal image 93).  No definite retroperitoneal, mesenteric, pelvic or inguinal lymphadenopathy.  Post hysterectomy.  No discrete adnexal lesion.  The urinary bladder is decompressed with Foley catheter.  There is a small amount of iatrogenic air within the urinary bladder.  Limited visualization of the lower thorax demonstrates small bilateral pleural effusions with associated dependent opacities, right greater than left.  Cardiomegaly.  No pericardial effusion.  No acute or aggressive osseous abnormalities.  Mild scoliotic curvature of the thoracolumbar spine, possibly positional.  Surgical clips within the left breast.  IMPRESSION:  1.   Findings compatible with small bowel obstruction with transition point located within the mid aspect of the abdomen.  The etiology of the obstruction is not depicted on this examination and thus presumably secondary to adhesions.  No evidence of perforation.  2.  Small amount of intra-abdominal ascites - the majority of fluid located adjacent to the liver and within the pelvic cul-de-sac.  No evidence of discrete/drainable intra-abdominal abscess. 3.  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   Original Report Authenticated By: Tacey Ruiz, MD   Dg Abd 2 Views  07/26/2012   *RADIOLOGY REPORT*  Clinical Data: Abdominal pain with nausea and vomiting.  No bowel movement for 2 days.  ABDOMEN - 2 VIEW  Comparison: Abdominal radiographs 07/25/2012 and 07/24/2012.  Findings: Nasogastric tube projects into the proximal stomach.  The small bowel distension appears minimally improved.  There is some gas within the transverse colon and rectum.  Decubitus view demonstrates multiple air-fluid levels and no evidence of pneumoperitoneum.  IMPRESSION: Minimal NG decompression of the small bowel.  The bowel gas pattern remains most consistent with a distal  partial small bowel obstruction.   Original Report Authenticated By: Carey Bullocks, M.D.    Medications: . docusate  100 mg Oral BID  . enoxaparin (LOVENOX) injection  30 mg Subcutaneous Q12H  . levothyroxine  25 mcg Intravenous QAC breakfast  . pantoprazole (PROTONIX) IV  40 mg Intravenous Q24H  . sodium phosphate  1 enema Rectal Once    Assessment/Plan 1. SBO: has improved with small BM yesterday, though still with some pain this morning relieved by NG tube to suction. Will give clear liquid diet today and clamp NG tube. If tolerates this will likely remove NG tomorrow and advance diet as tolerated.     LOS: 7 days    Lori Wiggins 07/28/2012

## 2012-07-28 NOTE — Progress Notes (Signed)
Physical Therapy Treatment Patient Details Name: Lori Wiggins MRN: [id number] DOB: 26-Apr-1928 Today's Date: 07/28/2012 Time: 4098-1191 PT Time Calculation (min): 43 min  PT Assessment / Plan / Recommendation  History of Present Illness Patient is an 79 yo female s/p Lt. TKA.   Clinical Impression    PT Comments   Patient making some slow progress. Still limited by GI pain and discomfort. Awaiting SNF when medically ready. Continue with current POC  Follow Up Recommendations  SNF     Does the patient have the potential to tolerate intense rehabilitation     Barriers to Discharge        Equipment Recommendations  None recommended by PT    Recommendations for Other Services    Frequency 7X/week   Progress towards PT Goals Progress towards PT goals: Progressing toward goals  Plan Current plan remains appropriate    Precautions / Restrictions Precautions Precautions: Fall;Knee Precaution Booklet Issued: No Restrictions Weight Bearing Restrictions: Yes LLE Weight Bearing: Weight bearing as tolerated   Pertinent Vitals/Pain 2/10 stomach pain. premedicated    Mobility  Bed Mobility Bed Mobility: Supine to Sit;Sitting - Scoot to Edge of Bed Supine to Sit: 4: Min guard;With rails Sitting - Scoot to Edge of Bed: 4: Min guard Details for Bed Mobility Assistance: Minguard for safety.  Transfers Sit to Stand: 4: Min guard;With upper extremity assist;From chair/3-in-1;From bed Stand to Sit: 4: Min guard;With upper extremity assist;To chair/3-in-1 Details for Transfer Assistance: Minguard for safety. Cues for hand placement. Ambulation/Gait Ambulation/Gait Assistance: 4: Min guard Ambulation Distance (Feet): 60 Feet Assistive device: Rolling walker Ambulation/Gait Assistance Details: Able to increase distance slightly Gait Pattern: Step-to pattern;Decreased step length - right Gait velocity: slow    Exercises Total Joint Exercises Quad Sets: AROM;Both;10 reps Heel  Slides: AAROM;Strengthening;Left;10 reps Hip ABduction/ADduction: AAROM;Strengthening;Left;10 reps Straight Leg Raises: AAROM;Strengthening;Left;10 reps Long Arc Quad: AROM;Strengthening;Left;10 reps   PT Diagnosis:    PT Problem List:   PT Treatment Interventions:     PT Goals (current goals can now be found in the care plan section) Acute Rehab PT Goals Patient Stated Goal: to have a BM  Visit Information  Last PT Received On: 07/28/12 Assistance Needed: +1 History of Present Illness: Patient is an 79 yo female s/p Lt. TKA.    Subjective Data  Patient Stated Goal: to have a BM   Cognition  Cognition Arousal/Alertness: Awake/alert Behavior During Therapy: WFL for tasks assessed/performed Overall Cognitive Status: Within Functional Limits for tasks assessed    Balance     End of Session PT - End of Session Equipment Utilized During Treatment: Gait belt Activity Tolerance: Patient tolerated treatment well Patient left: with call bell/phone within reach;in chair Nurse Communication: Mobility status CPM Left Knee CPM Left Knee: Off   GP     Fredrich Birks 07/28/2012, 12:06 PM  07/28/2012 Fredrich Birks PTA [phone] pager [phone] office

## 2012-07-28 NOTE — Progress Notes (Signed)
PATIENT ID: Lori Wiggins  MRN: [id number]  DOB/AGE:  07-02-1928 / 79 y.o.  7 Days Post-Op Procedure(s) (LRB): TOTAL KNEE ARTHROPLASTY (Left)    PROGRESS NOTE Subjective: Patient is alert, oriented, no Nausea, no Vomiting, yes passing gas, yes Bowel Movement. Not Taking PO. Denies SOB, Chest or Calf Pain. Using Incentive Spirometer, PAS in place. Ambulate WBAT.  Pt up in hall yesterday, CPM 0-60. Patient reports pain as mild  .    Objective: Vital signs in last 24 hours: Filed Vitals:   07/27/12 1320 07/27/12 2138 07/28/12 0612 07/28/12 0800  BP: 151/67 145/66 137/59   Pulse: 71 73 71   Temp: 98.2 F (36.8 C) 98.5 F (36.9 C) 98 F (36.7 C)   TempSrc:      Resp: 18 18 18 16   SpO2: 97% 98% 98%       Intake/Output from previous day: I/O last 3 completed shifts: In: 2725 [P.O.:240; I.V.:2425; Other:60] Out: 4400 [Urine:4100; Emesis/NG output:175; Other:125]   Intake/Output this shift:     LABORATORY DATA:  Recent Labs  07/27/12 0625 07/27/12 1430 07/27/12 1906 07/28/12 0500  WBC 13.7*  --   --   --   HGB 9.0*  --   --   --   HCT 25.0*  --   --   --   PLT 240  --   --   --   NA 118* 121* 124* 126*  K 4.0 4.0  --  3.5  CL 88* 89*  --  95*  CO2 22 16*  --  21  BUN 12 11  --  11  CREATININE 0.54 0.52  --  0.52  GLUCOSE 85 76  --  77  CALCIUM 8.2* 8.3*  --  8.1*    Examination: Neurologically intact Neurovascular intact Sensation intact distally Intact pulses distally Dorsiflexion/Plantar flexion intact Incision: no drainage No cellulitis present Compartment soft} Pt's abdomen is soft and non rigid.  Assessment:   7 Days Post-Op Procedure(s) (LRB): TOTAL KNEE ARTHROPLASTY (Left) ADDITIONAL DIAGNOSIS:  Ileus, Hypertension, UTI Preop and Sleep Apnea SBO with bowel movement yesterday and flatus over night.   Hypo natremia- improved, trending up.  Plan: PT/OT WBAT, CPM 5/hrs day until ROM 0-90 degrees, then D/C CPM DVT Prophylaxis:  Coumadin, SCDx72hrs,   DISCHARGE PLAN: Skilled Nursing Facility/Rehab- [facility] when cleared by Medicine and Surgery for SBO DISCHARGE NEEDS: HHPT, HHRN, CPM, Walker and 3-in-1 comode seat SBO - continue with follow treatment plan of surgery and GI.     Haneefah Venturini R 07/28/2012, 9:36 AM

## 2012-07-28 NOTE — Progress Notes (Signed)
SW updated facility on patient's progress. SW will continue to follow and assist with all discharge needs when patient is medically stable for discharge.  Sabino Niemann, MSW, [phone]

## 2012-07-28 NOTE — Progress Notes (Signed)
Occupational Therapy Treatment Patient Details Name: Lori Wiggins MRN: [id number] DOB: August 12, 1928 Today's Date: 07/28/2012 Time: 4098-1191 OT Time Calculation (min): 25 min  OT Assessment / Plan / Recommendation  History of present illness Patient is an 79 yo female s/p Lt. TKA.   Clinical Impression Pt progressing towards goals. Pt performed grooming sitting EOB and performed toilet transfer on 3 in 1 over commode.       Follow Up Recommendations  SNF;Supervision/Assistance - 24 hour    Barriers to Discharge       Equipment Recommendations  Other (comment) (defer to snf)    Recommendations for Other Services    Frequency Min 2X/week   Progress towards OT Goals Progress towards OT goals: Progressing toward goals  Plan Discharge plan remains appropriate    Precautions / Restrictions Precautions Precautions: Fall;Knee Precaution Booklet Issued: No Restrictions Weight Bearing Restrictions: Yes LLE Weight Bearing: Weight bearing as tolerated   Pertinent Vitals/Pain Pain 2/10 in abdomen and 3/10 in knee. Repositioned.     ADL  Grooming: Performed;Brushing hair;Set up;Supervision/safety Where Assessed - Grooming: Unsupported sitting Toilet Transfer: Performed;Min guard Toilet Transfer Method: Sit to Barista: Raised toilet seat with arms (or 3-in-1 over toilet) Toileting - Clothing Manipulation and Hygiene: Minimal assistance Where Assessed - Glass blower/designer Manipulation and Hygiene: Standing Equipment Used: Gait belt;Rolling walker Transfers/Ambulation Related to ADLs: Minguard ADL Comments: Pt brushed hair sitting on EOB. Pt ambulated in hallway and in bathroom to use 3 in 1.  Min A for clothing management.     OT Diagnosis:    OT Problem List:   OT Treatment Interventions:     OT Goals(current goals can now be found in the care plan section) Acute Rehab OT Goals Patient Stated Goal: to have a BM OT Goal Formulation: With patient Time  For Goal Achievement: 07/29/12 Potential to Achieve Goals: Good ADL Goals Pt Will Perform Grooming: with supervision;standing Pt Will Perform Lower Body Bathing: with min guard assist;sit to/from stand Pt Will Perform Lower Body Dressing: with min guard assist;sit to/from stand Pt Will Transfer to Toilet: with supervision;ambulating;bedside commode Pt Will Perform Toileting - Clothing Manipulation and hygiene: sit to/from stand;with min guard assist  Visit Information  Last OT Received On: 07/28/12 Assistance Needed: +1 PT/OT Co-Evaluation/Treatment: Yes History of Present Illness: Patient is an 79 yo female s/p Lt. TKA.    Subjective Data      Prior Functioning       Cognition  Cognition Arousal/Alertness: Awake/alert Behavior During Therapy: WFL for tasks assessed/performed Overall Cognitive Status: Within Functional Limits for tasks assessed    Mobility  Bed Mobility Bed Mobility: Supine to Sit;Sitting - Scoot to Edge of Bed Supine to Sit: 4: Min guard;With rails Sitting - Scoot to Edge of Bed: 4: Min guard Details for Bed Mobility Assistance: Minguard for safety.  Transfers Transfers: Sit to Stand;Stand to Sit Sit to Stand: 4: Min guard;With upper extremity assist;From chair/3-in-1;From bed Stand to Sit: 4: Min guard;With upper extremity assist;To chair/3-in-1 Details for Transfer Assistance: Minguard for safety. Cues for hand placement.    Exercises      Balance     End of Session OT - End of Session Equipment Utilized During Treatment: Gait belt;Rolling walker Activity Tolerance: Patient tolerated treatment well Patient left: in chair;with call bell/phone within reach;with family/visitor present (with PT) CPM Left Knee CPM Left Knee: Off   GO     Earlie Raveling OTR/L [phone] 07/28/2012, 10:39 AM

## 2012-07-29 LAB — BASIC METABOLIC PANEL
BUN: 12 mg/dL (ref 6–23)
BUN: 8 mg/dL (ref 6–23)
CO2: 23 mEq/L (ref 19–32)
Calcium: 8.1 mg/dL — ABNORMAL LOW (ref 8.4–10.5)
Chloride: 97 mEq/L (ref 96–112)
Creatinine, Ser: 0.54 mg/dL (ref 0.50–1.10)
Creatinine, Ser: 0.5 mg/dL (ref 0.50–1.10)
GFR calc non Af Amer: 86 mL/min — ABNORMAL LOW (ref >90)
Glucose, Bld: 148 mg/dL — ABNORMAL HIGH (ref 70–99)
Glucose, Bld: 92 mg/dL (ref 70–99)
Sodium: 114 mEq/L — CL (ref 135–145)

## 2012-07-29 LAB — MAGNESIUM: Magnesium: 1.8 mg/dL (ref 1.5–2.5)

## 2012-07-29 MED ORDER — PANTOPRAZOLE SODIUM 40 MG PO TBEC
40.0000 mg | DELAYED_RELEASE_TABLET | Freq: Every day | ORAL | Status: DC
  Administered 2012-07-30: 40 mg via ORAL
  Filled 2012-07-29: qty 1

## 2012-07-29 MED ORDER — LEVOTHYROXINE SODIUM 50 MCG PO TABS
50.0000 ug | ORAL_TABLET | Freq: Every day | ORAL | Status: DC
  Administered 2012-07-30: 50 ug via ORAL
  Filled 2012-07-29 (×2): qty 1

## 2012-07-29 MED ORDER — POTASSIUM CHLORIDE 10 MEQ/100ML IV SOLN
10.0000 meq | INTRAVENOUS | Status: AC
  Administered 2012-07-29 (×4): 10 meq via INTRAVENOUS
  Filled 2012-07-29 (×4): qty 100

## 2012-07-29 NOTE — Progress Notes (Signed)
SNF bed at [facility] confirmed for patient (tentatively tomorrow) and [facility] Medicare is on standby for SNF auth- will f/u tomorrow for further planning-  Reece Levy, MSW [phone]

## 2012-07-29 NOTE — Progress Notes (Addendum)
Sodium improving. Potassium low. Will replete IV and check magnesium level. Decrease saline to 50 cc an hour. Will examine later today.

## 2012-07-29 NOTE — Progress Notes (Signed)
Physical Therapy Treatment Patient Details Name: Talia Hoheisel MRN: [id number] DOB: 10/03/1928 Today's Date: 07/29/2012 Time: 7829-5621 PT Time Calculation (min): 49 min  PT Assessment / Plan / Recommendation  History of Present Illness Patient is an 79 yo female s/p Lt. TKA.      PT Comments   Patient making good progress today. Patient now has NG tube and cathter out. Patient very pleased with progress and started stating as she walked, "look at me, look at me!!!". Patient with lost of bowel walking to restroom. Increased time and assistance for pericare. Continue with current POC. Patient awaiting word on when she will be able to go to [facility] [place]  Follow Up Recommendations  SNF     Does the patient have the potential to tolerate intense rehabilitation     Barriers to Discharge        Equipment Recommendations  None recommended by PT    Recommendations for Other Services    Frequency 7X/week   Progress towards PT Goals Progress towards PT goals: Progressing toward goals  Plan Current plan remains appropriate    Precautions / Restrictions Precautions Precautions: Fall;Knee Precaution Comments: Provided patient with education on precautions. No pillows under knee Restrictions Weight Bearing Restrictions: Yes LLE Weight Bearing: Weight bearing as tolerated   Pertinent Vitals/Pain Complains of soreness not pain    Mobility  Bed Mobility Supine to Sit: 5: Supervision;With rails Sitting - Scoot to Edge of Bed: 5: Supervision Transfers Sit to Stand: 4: Min guard;With upper extremity assist;From chair/3-in-1;From bed;From toilet Stand to Sit: 4: Min guard;With upper extremity assist;To chair/3-in-1;To toilet Details for Transfer Assistance: Minguard for safety. Cues for hand placement. Ambulation/Gait Ambulation/Gait Assistance: 4: Min guard Ambulation Distance (Feet): 80 Feet Assistive device: Rolling walker Ambulation/Gait Assistance Details: CUes to increase weight  through LEs and stand upright Gait Pattern: Step-to pattern;Decreased step length - right Gait velocity: increasing    Exercises Total Joint Exercises Quad Sets: AROM;Both;15 reps Heel Slides: AAROM;Strengthening;Left;15 reps Hip ABduction/ADduction: AAROM;Strengthening;Left;15 reps Straight Leg Raises: AAROM;Strengthening;Left;15 reps Long Arc Quad: AROM;Strengthening;Left;15 reps   PT Diagnosis:    PT Problem List:   PT Treatment Interventions:     PT Goals (current goals can now be found in the care plan section)    Visit Information  Last PT Received On: 07/29/12 Assistance Needed: +1 History of Present Illness: Patient is an 79 yo female s/p Lt. TKA.    Subjective Data      Cognition  Cognition Arousal/Alertness: Awake/alert Behavior During Therapy: WFL for tasks assessed/performed Overall Cognitive Status: Within Functional Limits for tasks assessed    Balance     End of Session PT - End of Session Equipment Utilized During Treatment: Gait belt Activity Tolerance: Patient tolerated treatment well Patient left: with call bell/phone within reach;in chair Nurse Communication: Mobility status CPM Left Knee CPM Left Knee: Off   GP     Fredrich Birks 07/29/2012, 9:19 AM  07/29/2012 Fredrich Birks PTA [phone] pager [phone] office

## 2012-07-29 NOTE — Progress Notes (Signed)
Patient ID: Lori Wiggins, female   DOB: 10/26/1928, 79 y.o.   MRN: [id number] 8 Days Post-Op  Subjective: Pt feels well today.  Ready to get out of here.  Tolerating clears.  Had a BM this morning.  Objective: Vital signs in last 24 hours: Temp:  [97.5 F (36.4 C)-98.1 F (36.7 C)] 97.8 F (36.6 C) (07/29 0448) Pulse Rate:  [66-79] 79 (07/29 0448) Resp:  [16-18] 16 (07/29 0448) BP: (131-153)/(65-95) 131/95 mmHg (07/29 0448) SpO2:  [93 %-100 %] 93 % (07/29 0448) Last BM Date: 07/27/12  Intake/Output from previous day: 07/28 0701 - 07/29 0700 In: -  Out: 1650 [Urine:1650] Intake/Output this shift:    PE: Abd: soft, NT, ND, +BS  Lab Results:   Recent Labs  07/27/12 0625  WBC 13.7*  HGB 9.0*  HCT 25.0*  PLT 240   BMET  Recent Labs  07/28/12 0500 07/29/12 0511  NA 126* 129*  K 3.5 2.9*  CL 95* 97  CO2 21 23  GLUCOSE 77 92  BUN 11 8  CREATININE 0.52 0.50  CALCIUM 8.1* 8.1*   PT/INR No results found for this basename: LABPROT, INR,  in the last 72 hours CMP     Component Value Date/Time   NA 129* 07/29/2012 0511   K 2.9* 07/29/2012 0511   CL 97 07/29/2012 0511   CO2 23 07/29/2012 0511   GLUCOSE 92 07/29/2012 0511   BUN 8 07/29/2012 0511   CREATININE 0.50 07/29/2012 0511   CALCIUM 8.1* 07/29/2012 0511   PROT 5.2* 07/26/2012 0250   ALBUMIN 2.1* 07/26/2012 0250   AST 24 07/26/2012 0250   ALT 14 07/26/2012 0250   ALKPHOS 63 07/26/2012 0250   BILITOT 0.5 07/26/2012 0250   GFRNONAA 88* 07/29/2012 0511   GFRAA >90 07/29/2012 0511   Lipase  No results found for this basename: lipase       Studies/Results: Dg Abd 1 View  07/28/2012   *RADIOLOGY REPORT*  Clinical Data: Follow up small bowel obstruction  ABDOMEN - 1 VIEW  Comparison: 07/26/2012  Findings: There is a nasogastric tube with tip in the proximal stomach.  The side port is just above the GE junction.  There is been interval improvement of previous small bowel dilatation.  Enteric contrast material has  progressed into the colon.  No free air.  IMPRESSION:  1.  Improvement small bowel obstruction pattern.   Original Report Authenticated By: Signa Kell, M.D.    Anti-infectives: Anti-infectives   Start     Dose/Rate Route Frequency Ordered Stop   07/24/12 1430  ceFAZolin (ANCEF) IVPB 1 g/50 mL premix     1 g 100 mL/hr over 30 Minutes Intravenous 3 times per day 07/24/12 1409 07/26/12 0606   07/23/12 1800  cephALEXin (KEFLEX) capsule 250 mg  Status:  Discontinued     250 mg Oral 4 times per day 07/23/12 1713 07/24/12 1409   07/21/12 2000  ciprofloxacin (CIPRO) tablet 500 mg  Status:  Discontinued     500 mg Oral 2 times daily 07/21/12 1432 07/23/12 1713   07/21/12 1019  cefUROXime (ZINACEF) injection  Status:  Discontinued       As needed 07/21/12 1020 07/21/12 1110   07/21/12 0600  ceFAZolin (ANCEF) IVPB 2 g/50 mL premix     2 g 100 mL/hr over 30 Minutes Intravenous On call to O.R. 07/20/12 1403 07/21/12 0935       Assessment/Plan  1. PSBO vs ileus 2. S/p TKR  Plan: 1.  Pt improved.  Will advance to full liquids and then regular diet tomorrow for breakfast.  If she tolerates this tomorrow am, then she should be stable for dc to rehab.   LOS: 8 days    Thiago Ragsdale E 07/29/2012, 11:14 AM Pager: [phone]

## 2012-07-29 NOTE — Progress Notes (Signed)
Improving Patient examined and I agree with the assessment and plan  Violeta Gelinas, MD, MPH, FACS Pager: [phone]  07/29/2012 4:16 PM

## 2012-07-29 NOTE — Progress Notes (Signed)
[facility] PROGRESS NOTE  Deedra Pro YQM:[id number] DOB: Apr 22, 1928 DOA: 07/21/2012 PCP: Pcp Not In System  Assessment/Plan:  Hyponatremia:  Improving. Decrease saline to kvo.  Hypokalemia: Being repleted IV. Repeat basic metabolic panel in the morning.  Hypothyroidism:  TSH ok. Change to oral synthroid    PSBO/Ileus, postoperative: resolving    E. coli UTI (urinary tract infection), fluoroquinolone resistant: treated    Essential hypertension, benign, controlled  Left total knee arthroplasty  HPI/Subjective: Having multiple bowel movements. Tolerating full diet.  Objective: Filed Vitals:   07/28/12 1548 07/28/12 2029 07/29/12 0448 07/29/12 1252  BP:  153/65 131/95 138/57  Pulse:  73 79 89  Temp:  97.5 F (36.4 C) 97.8 F (36.6 C) 98.8 F (37.1 C)  TempSrc:      Resp: 16 16 16 18   SpO2:  100% 93% 98%    Intake/Output Summary (Last 24 hours) at 07/29/12 1845 Last data filed at 07/29/12 0448  Gross per 24 hour  Intake      0 ml  Output   1000 ml  Net  -1000 ml   There were no vitals filed for this visit.  Exam:   General:  Alert, oriented  Cardiovascular: RRR without MFR  Respiratory: CTA without WRR  Abdomen: S, NT, ND  Musculoskeletal: no cce  Data Reviewed: Basic Metabolic Panel:  Recent Labs Lab 07/26/12 0250 07/27/12 0625 07/27/12 1430 07/27/12 1906 07/28/12 0500 07/29/12 0511 07/29/12 0742  NA 118* 118* 121* 124* 126* 129*  --   K 3.4* 4.0 4.0  --  3.5 2.9*  --   CL 88* 88* 89*  --  95* 97  --   CO2 22 22 16*  --  21 23  --   GLUCOSE 97 85 76  --  77 92  --   BUN 13 12 11   --  11 8  --   CREATININE 0.53 0.54 0.52  --  0.52 0.50  --   CALCIUM 8.1* 8.2* 8.3*  --  8.1* 8.1*  --   MG  --   --   --   --   --   --  1.8   Liver Function Tests:  Recent Labs Lab 07/26/12 0250  AST 24  ALT 14  ALKPHOS 63  BILITOT 0.5  PROT 5.2*  ALBUMIN 2.1*   No results found for this basename: LIPASE, AMYLASE,  in the last 168  hours No results found for this basename: AMMONIA,  in the last 168 hours CBC:  Recent Labs Lab 07/23/12 0520 07/24/12 0450 07/27/12 0625  WBC 13.0* 14.4* 13.7*  HGB 9.3* 9.7* 9.0*  HCT 26.3* 26.3* 25.0*  MCV 81.9 80.2 81.2  PLT 147* 178 240   Cardiac Enzymes: No results found for this basename: CKTOTAL, CKMB, CKMBINDEX, TROPONINI,  in the last 168 hours BNP (last 3 results) No results found for this basename: PROBNP,  in the last 8760 hours CBG:  Recent Labs Lab 07/25/12 0904  GLUCAP 127*    No results found for this or any previous visit (from the past 240 hour(s)).   Studies: Dg Abd 1 View  07/28/2012   *RADIOLOGY REPORT*  Clinical Data: Follow up small bowel obstruction  ABDOMEN - 1 VIEW  Comparison: 07/26/2012  Findings: There is a nasogastric tube with tip in the proximal stomach.  The side port is just above the GE junction.  There is been interval improvement of previous small bowel dilatation.  Enteric contrast material has progressed  into the colon.  No free air.  IMPRESSION:  1.  Improvement small bowel obstruction pattern.   Original Report Authenticated By: Signa Kell, M.D.    Scheduled Meds: . docusate  100 mg Oral BID  . enoxaparin (LOVENOX) injection  30 mg Subcutaneous Q12H  . [START ON 07/30/2012] levothyroxine  50 mcg Oral QAC breakfast  . [START ON 07/30/2012] pantoprazole  40 mg Oral Daily  . sodium phosphate  1 enema Rectal Once   Continuous Infusions: . sodium chloride 50 mL/hr at 07/29/12 0735   Time spent: 25 min  Domingo Fuson L  [facility] Pager [phone]. If 7PM-7AM, please contact night-coverage at [email or web address], password [facility] 07/29/2012, 6:45 PM  LOS: 8 days

## 2012-07-29 NOTE — Progress Notes (Signed)
PATIENT ID: Lori Wiggins  MRN: [id number]  DOB/AGE:  October 12, 1928 / 79 y.o.  8 Days Post-Op Procedure(s) (LRB): TOTAL KNEE ARTHROPLASTY (Left)    PROGRESS NOTE Subjective: Patient is alert, oriented, no Nausea, no Vomiting, yes passing gas, yes Bowel Movement. Taking PO well. Denies SOB, Chest or Calf Pain. Using Incentive Spirometer, PAS in place. Ambulate WBAT, pt up in hall walking, CPM 0-60 Patient reports pain as mild  .    Objective: Vital signs in last 24 hours: Filed Vitals:   07/28/12 1353 07/28/12 1548 07/28/12 2029 07/29/12 0448  BP: 151/72  153/65 131/95  Pulse: 66  73 79  Temp: 98.1 F (36.7 C)  97.5 F (36.4 C) 97.8 F (36.6 C)  TempSrc:      Resp: 18 16 16 16   SpO2: 99%  100% 93%      Intake/Output from previous day: I/O last 3 completed shifts: In: 1040 [P.O.:240; I.V.:800] Out: 3200 [Urine:3150; Emesis/NG output:50]   Intake/Output this shift:     LABORATORY DATA:  Recent Labs  07/27/12 0625  07/28/12 0500 07/29/12 0511  WBC 13.7*  --   --   --   HGB 9.0*  --   --   --   HCT 25.0*  --   --   --   PLT 240  --   --   --   NA 118*  < > 126* 129*  K 4.0  < > 3.5 2.9*  CL 88*  < > 95* 97  CO2 22  < > 21 23  BUN 12  < > 11 8  CREATININE 0.54  < > 0.52 0.50  GLUCOSE 85  < > 77 92  CALCIUM 8.2*  < > 8.1* 8.1*  < > = values in this interval not displayed.  Examination: Neurologically intact ABD soft Neurovascular intact Sensation intact distally Intact pulses distally Dorsiflexion/Plantar flexion intact Incision: no drainage No cellulitis present Compartment soft}  Assessment:   8 Days Post-Op Procedure(s) (LRB): TOTAL KNEE ARTHROPLASTY (Left) ADDITIONAL DIAGNOSIS:  Ileus  SBO - followed by Surgery, improving with NG tube removed. Hypokalemia - potassium ordered by medicine.   Plan: PT/OT WBAT, CPM 5/hrs day until ROM 0-90 degrees, then D/C CPM DVT Prophylaxis:  SCD, lovenox DISCHARGE PLAN: Skilled Nursing Facility/Rehab when cleared  by medicine and surgery.   DISCHARGE NEEDS: HHPT, HHRN, CPM and Walker Continue to follow treatment plans of Surgery and medicine. D/C foley      Lori Wiggins R 07/29/2012, 7:34 AM

## 2012-07-30 LAB — BASIC METABOLIC PANEL
Calcium: 8.5 mg/dL (ref 8.4–10.5)
GFR calc Af Amer: >90 mL/min (ref >90)
GFR calc non Af Amer: 86 mL/min — ABNORMAL LOW (ref >90)
Potassium: 3.3 mEq/L — ABNORMAL LOW (ref 3.5–5.1)
Sodium: 129 mEq/L — ABNORMAL LOW (ref 135–145)

## 2012-07-30 MED ORDER — POTASSIUM CHLORIDE CRYS ER 20 MEQ PO TBCR
40.0000 meq | EXTENDED_RELEASE_TABLET | Freq: Once | ORAL | Status: AC
  Administered 2012-07-30: 40 meq via ORAL
  Filled 2012-07-30: qty 2

## 2012-07-30 MED ORDER — ENOXAPARIN SODIUM 30 MG/0.3ML ~~LOC~~ SOLN: 30.0000 mg | Freq: Two times a day (BID) | SUBCUTANEOUS | Status: DC

## 2012-07-30 NOTE — Progress Notes (Signed)
Pt discharged to SNF accompanied by family. Report was called to RN receiving patient at SNF. Pts IV was removed. Pt left unit in a stable condition via wheelchair.

## 2012-07-30 NOTE — Progress Notes (Signed)
Patient examined and I agree with the assessment and plan  Laycee Fitzsimmons, MD, MPH, FACS Pager: [phone]  07/30/2012 3:37 PM  

## 2012-07-30 NOTE — Discharge Summary (Signed)
Patient ID: Lori Wiggins MRN: [id number] DOB/AGE: 03-06-1928 79 y.o.  Admit date: 07/21/2012 Discharge date: 07/30/2012  Admission Diagnoses:  Principal Problem:   Arthritis of knee, left Active Problems:   Ileus, postoperative   Hyponatremia   E. coli UTI (urinary tract infection), fluoroquinolone resistant   Essential hypertension, benign   Discharge Diagnoses:  Same  Past Medical History  Diagnosis Date  . Hypertension   . Hypothyroidism   . Sleep apnea     uses CPAP  . GERD (gastroesophageal reflux disease)   . Arthritis   . Breast cancer     left  breast    Surgeries: Procedure(s): TOTAL KNEE ARTHROPLASTY on 07/21/2012   Consultants: Treatment Team:  Md Montez Morita, MD Mahala Menghini, MD  Discharged Condition: Improved  Hospital Course: Lori Wiggins is an 79 y.o. female who was admitted 07/21/2012 for operative treatment ofArthritis of knee, left. Patient has severe unremitting pain that affects sleep, daily activities, and work/hobbies. After pre-op clearance the patient was taken to the operating room on 07/21/2012 and underwent  Procedure(s): TOTAL KNEE ARTHROPLASTY.    Patient was given perioperative antibiotics: Anti-infectives   Start     Dose/Rate Route Frequency Ordered Stop   07/24/12 1430  ceFAZolin (ANCEF) IVPB 1 g/50 mL premix     1 g 100 mL/hr over 30 Minutes Intravenous 3 times per day 07/24/12 1409 07/26/12 0606   07/23/12 1800  cephALEXin (KEFLEX) capsule 250 mg  Status:  Discontinued     250 mg Oral 4 times per day 07/23/12 1713 07/24/12 1409   07/21/12 2000  ciprofloxacin (CIPRO) tablet 500 mg  Status:  Discontinued     500 mg Oral 2 times daily 07/21/12 1432 07/23/12 1713   07/21/12 1019  cefUROXime (ZINACEF) injection  Status:  Discontinued       As needed 07/21/12 1020 07/21/12 1110   07/21/12 0600  ceFAZolin (ANCEF) IVPB 2 g/50 mL premix     2 g 100 mL/hr over 30 Minutes Intravenous On call to O.R. 07/20/12 1403 07/21/12 0935        Patient was given sequential compression devices, early ambulation, and chemoprophylaxis to prevent DVT. Postoperatively the patient did develop an ileus that required placement of an NG tube or for 5 days after consultation with both gastroenterology and general surgery. There is concern for small bowel obstruction but her ileus got better and she had a number of bowel movements prior to discharge. She also developed hypokalemia which was treated with potassium replenishment and at the time of discharge her potassium had come up from 2.9-3.3. Prior to discharge she is ambulating well in the hallway her knee wound looked excellent there is no effusion, the knee and she did very well throughout the visit  Patient benefited maximally from hospital stay and there were no complications.    Recent vital signs: Patient Vitals for the past 24 hrs:  BP Temp Temp src Pulse Resp SpO2  07/30/12 1200 - - - - 16 98 %  07/30/12 0800 - - - - 18 99 %  07/30/12 0503 167/65 mmHg 98.3 F (36.8 C) Oral 78 18 98 %  07/29/12 2100 146/57 mmHg 98.4 F (36.9 C) Oral 76 18 98 %  07/29/12 1252 138/57 mmHg 98.8 F (37.1 C) - 89 18 98 %     Recent laboratory studies:  Recent Labs  07/29/12 0511 07/30/12 0634  NA 129* 129*  K 2.9* 3.3*  CL 97 95*  CO2 23 24  BUN  8 5*  CREATININE 0.50 0.53  GLUCOSE 92 107*  CALCIUM 8.1* 8.5     Discharge Medications:     Medication List         CALCIUM + D + K PO  Take 1 tablet by mouth daily.     ciprofloxacin 500 MG tablet  Commonly known as:  CIPRO  Take 500 mg by mouth 2 (two) times daily.     Co Q-10 100 MG Caps  Take 100 mg by mouth daily.     enoxaparin 30 MG/0.3ML injection  Commonly known as:  LOVENOX  Inject 0.3 mLs (30 mg total) into the skin every 12 (twelve) hours.     Grape Seed 100 MG Caps  Take 100 mg by mouth daily.     levothyroxine 50 MCG tablet  Commonly known as:  SYNTHROID, LEVOTHROID  Take 50 mcg by mouth daily before breakfast.      multivitamin with minerals Tabs  Take 1 tablet by mouth daily.     OMEGA-3 PO  Take 360 mg by mouth daily.     oxyCODONE-acetaminophen 5-325 MG per tablet  Commonly known as:  ROXICET  Take 1 tablet by mouth every 4 (four) hours as needed for pain.     tizanidine 2 MG capsule  Commonly known as:  ZANAFLEX  Take 2 capsules (4 mg total) by mouth 3 (three) times daily as needed for muscle spasms.     valsartan-hydrochlorothiazide 160-25 MG per tablet  Commonly known as:  DIOVAN-HCT  Take 1 tablet by mouth daily.     Vitamin D3 5000 UNITS Tabs  Take 5,000 Units by mouth daily.     warfarin 5 MG tablet  Commonly known as:  COUMADIN  Take 1 tablet (5 mg total) by mouth daily.        Diagnostic Studies: Dg Chest 2 View  07/16/2012   *RADIOLOGY REPORT*  Clinical Data: Preop.  Total knee arthroplasty.  CHEST - 2 VIEW  Comparison: None.  Findings: The heart size is within normal limits.  Mild interstitial coarsening appears chronic.  No focal airspace disease is evident.  There is no edema or effusion to suggest failure. Surgical clips are noted in the left breast.  The visualized soft tissues and bony thorax are unremarkable.  IMPRESSION:  1.  No acute cardiopulmonary disease. 2.  Mild interstitial coarsening appears chronic.   Original Report Authenticated By: Marin Roberts, M.D.   Dg Abd 1 View  07/28/2012   *RADIOLOGY REPORT*  Clinical Data: Follow up small bowel obstruction  ABDOMEN - 1 VIEW  Comparison: 07/26/2012  Findings: There is a nasogastric tube with tip in the proximal stomach.  The side port is just above the GE junction.  There is been interval improvement of previous small bowel dilatation.  Enteric contrast material has progressed into the colon.  No free air.  IMPRESSION:  1.  Improvement small bowel obstruction pattern.   Original Report Authenticated By: Signa Kell, M.D.   Ct Abdomen Pelvis W Contrast  07/26/2012   *RADIOLOGY REPORT*  Clinical Data: Abdominal  pain, no bowel movements, evaluate for small bowel obstruction  CT ABDOMEN AND PELVIS WITH CONTRAST  Technique:  Multidetector CT imaging of the abdomen and pelvis was performed following the standard protocol during bolus administration of intravenous contrast.  Contrast: OMNIPAQUE IOHEXOL 300 MG/ML  SOLN  Comparison: Abdominal radiograph - earlier same day  Findings:  Examination is degraded secondary to the patient's arms overlying the upper abdomen.  Enteric contrast extends to the level of the mid small bowel. There is moderate distension of multiple upstream loops of small bowel with index looped within the right mid hemiabdomen measuring approximately 3.4 cm in diameter (image 47, series 3).  There is an apparent transition point of the obstruction within the mid aspect of the abdomen (image 36, series 3; coronal image 63 of 64) with associated decompression of the more distal / downstream loops of small bowel.  There is a small amount of residual stool within the colon which is otherwise decompressed.  No pneumoperitoneum, pneumatosis or portal venous gas.  Enteric tube terminates within the mid aspect of the stomach.  There is a small amount of fluid adjacent to the caudal aspect of the right lobe of the liver as well as within the pelvic cul-de- sac.  Normal hepatic contour. No discrete hepatic lesions.  Normal appearance of the gallbladder.  No intra or extrahepatic biliary ductal dilatation.  There is symmetric enhancement and excretion of the bilateral kidneys.  No definite renal lesions.  No definite renal stones on the post contrast examination.  No urinary obstruction or perinephric stranding.  Normal appearance of the bilateral adrenal glands, pancreas and spleen.  Normal caliber of the abdominal aorta.  The major branch vessels of the abdominal aorta appear patent on this non CTA examination. 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  No definite retroperitoneal, mesenteric, pelvic or inguinal lymphadenopathy.  Post hysterectomy.  No discrete adnexal lesion.  The urinary bladder is decompressed with Foley catheter.  There is a small amount of iatrogenic air within the urinary bladder.  Limited visualization of the lower thorax demonstrates small bilateral pleural effusions with associated dependent opacities, right greater than left.  Cardiomegaly.  No pericardial effusion.  No acute or aggressive osseous abnormalities.  Mild scoliotic curvature of the thoracolumbar spine, possibly positional.  Surgical clips within the left breast.  IMPRESSION:  1.   Findings compatible with small bowel obstruction with transition point located within the mid aspect of the abdomen.  The etiology of the obstruction is not depicted on this examination and thus presumably secondary to adhesions.  No evidence of perforation.  2.  Small amount of intra-abdominal ascites - the majority of fluid located adjacent to the liver and within the pelvic cul-de-sac.  No evidence of discrete/drainable intra-abdominal abscess. 3.  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   Original Report Authenticated By: Tacey Ruiz, MD   Dg Abd 2 Views  07/26/2012   *RADIOLOGY REPORT*  Clinical Data: Abdominal pain with nausea and vomiting.  No bowel movement for 2 days.  ABDOMEN - 2 VIEW  Comparison: Abdominal radiographs 07/25/2012 and 07/24/2012.  Findings: Nasogastric tube projects into the proximal stomach.  The small bowel distension appears minimally improved.  There is some gas within the transverse colon and rectum.  Decubitus view demonstrates multiple air-fluid levels and no evidence of pneumoperitoneum.  IMPRESSION: Minimal NG decompression of the small bowel.  The bowel gas pattern remains most consistent with a distal partial small bowel obstruction.    Original Report Authenticated By: Carey Bullocks, M.D.   Dg Abd Portable 1v  07/25/2012   *RADIOLOGY REPORT*  Clinical Data: Abdominal pain, small bowel obstruction  PORTABLE ABDOMEN - 1 VIEW  Comparison: July 24, 2012.  Findings: There  is continued presence of dilated small bowel loops seen which is not significantly changed compared to prior exam.  No colonic dilatation is noted.  Stool and gas are noted in the rectum. Nasogastric tube tip is seen in the expected position of the proximal stomach.  No abnormal calcifications are noted.  IMPRESSION: No significant change in dilated small bowel loops noted on prior exam, most consistent with distal small bowel obstruction. Nasogastric tube tip seen in proximal stomach.   Original Report Authenticated By: Lupita Raider.,  M.D.   Dg Abd Portable 1v  07/24/2012   *RADIOLOGY REPORT*  Clinical Data: Nausea and vomiting, abdominal distension  PORTABLE ABDOMEN - 1 VIEW  Comparison: None.  Findings: Multiple dilated loops of small bowel are identified measuring up to 4 cm. There is a paucity of colonic bowel gas identified.  No abnormal mass or abnormal calcifications are noted. No bony abnormality is seen.  IMPRESSION: Small bowel obstruction likely in the distal ileum.  CT may be helpful for further evaluation.   Original Report Authenticated By: Alcide Clever, M.D.    Disposition: Final discharge disposition not confirmed      Discharge Orders   Future Orders Complete By Expires     CPM  As directed     Comments:      Continuous passive motion machine (CPM):      Use the CPM from 0 to 60 for 5 hours per day.      You may increase by 10 degrees per day.  You may break it up into 2 or 3 sessions per day.      Use CPM for 2 weeks or until you are told to stop.    CPM  As directed     Comments:      Continuous passive motion machine (CPM):      Use the CPM from 0 to 60 for 5 hours per day.      You may increase by 10 degrees per day.  You may break it  up into 2 or 3 sessions per day.      Use CPM for 2 weeks or until you are told to stop.    Call MD / Call 911  As directed     Comments:      If you experience chest pain or shortness of breath, CALL 911 and be transported to the hospital emergency room.  If you develope a fever above 101 F, pus (white drainage) or increased drainage or redness at the wound, or calf pain, call your surgeon's office.    Call MD / Call 911  As directed     Comments:      If you experience chest pain or shortness of breath, CALL 911 and be transported to the hospital emergency room.  If you develope a fever above 101 F, pus (white drainage) or increased drainage or redness at the wound, or calf pain, call your surgeon's office.    Change dressing  As directed     Comments:      Change dressing on POD #5.  You may clean the incision with alcohol prior to redressing.    Change dressing  As directed     Comments:      Change dressing on POD #5.  You may clean the incision with alcohol prior to redressing.    Constipation Prevention  As directed     Comments:      Drink plenty of fluids.  Prune  juice may be helpful.  You may use a stool softener, such as Colace (over the counter) 100 mg twice a day.  Use MiraLax (over the counter) for constipation as needed.    Constipation Prevention  As directed     Comments:      Drink plenty of fluids.  Prune juice may be helpful.  You may use a stool softener, such as Colace (over the counter) 100 mg twice a day.  Use MiraLax (over the counter) for constipation as needed.    Diet - low sodium heart healthy  As directed     Diet - low sodium heart healthy  As directed     Discharge instructions  As directed     Comments:      Follow up with Dr. Turner Daniels in 2 weeks.    Do not put a pillow under the knee. Place it under the heel.  As directed     Do not put a pillow under the knee. Place it under the heel.  As directed     Driving restrictions  As directed     Comments:      No  driving for 2 weeks    Driving restrictions  As directed     Comments:      No driving for 2 weeks    Increase activity slowly as tolerated  As directed     Increase activity slowly as tolerated  As directed     Patient may shower  As directed     Comments:      You may shower without a dressing once there is no drainage.  Do not wash over the wound.  If drainage remains, cover wound with plastic wrap and then shower.    Patient may shower  As directed     Comments:      You may shower without a dressing once there is no drainage.  Do not wash over the wound.  If drainage remains, cover wound with plastic wrap and then shower.       Follow-up Information   Follow up with Nestor Lewandowsky, MD In 1 week.   Contact information:   [address] [place] [address] [phone]        Signed: Nestor Lewandowsky 07/30/2012, 12:49 PM

## 2012-07-30 NOTE — Progress Notes (Signed)
Physical Therapy Treatment Patient Details Name: Lori Wiggins MRN: [id number] DOB: [birth year] Today's Date: 07/30/2012 Time: 4098-1191 PT Time Calculation (min): 23 min  PT Assessment / Plan / Recommendation  History of Present Illness Patient is an 79 yo female s/p Lt. TKA.   PT Comments   Progressing well. Wanted to sit on toilet for a while. Therex not completed this session. Continue to recommend SNF for ongoing Physical Therapy.     Follow Up Recommendations  SNF     Does the patient have the potential to tolerate intense rehabilitation     Barriers to Discharge        Equipment Recommendations  None recommended by PT    Recommendations for Other Services    Frequency 7X/week   Progress towards PT Goals Progress towards PT goals: Progressing toward goals  Plan Current plan remains appropriate    Precautions / Restrictions Precautions Precautions: Fall;Knee Restrictions Weight Bearing Restrictions: Yes LLE Weight Bearing: Weight bearing as tolerated   Pertinent Vitals/Pain no apparent distress     Mobility  Bed Mobility Bed Mobility: Not assessed Transfers Sit to Stand: 5: Supervision;From chair/3-in-1 Stand to Sit: 5: Supervision;To chair/3-in-1 Ambulation/Gait Ambulation/Gait Assistance: 4: Min guard Ambulation Distance (Feet): 100 Feet Assistive device: Rolling walker Gait Pattern: Step-to pattern;Decreased step length - right Gait velocity: increasing    Exercises     PT Diagnosis:    PT Problem List:   PT Treatment Interventions:     PT Goals (current goals can now be found in the care plan section)    Visit Information  Last PT Received On: 07/30/12 Assistance Needed: +1 History of Present Illness: Patient is an 79 yo female s/p Lt. TKA.    Subjective Data      Cognition  Cognition Arousal/Alertness: Awake/alert Behavior During Therapy: WFL for tasks assessed/performed Overall Cognitive Status: Within Functional Limits for tasks  assessed    Balance     End of Session PT - End of Session Equipment Utilized During Treatment: Gait belt Activity Tolerance: Patient tolerated treatment well Patient left: with call bell/phone within reach;in chair CPM Left Knee CPM Left Knee: Off   GP     Fredrich Birks 07/30/2012, 11:52 AM 07/30/2012 Fredrich Birks PTA [phone] pager [phone] office

## 2012-07-30 NOTE — Progress Notes (Signed)
Patient for d/c today to SNF bed at [facility]. Husband and patient agreeable to this plan- plan transfer via husbands car. Reece Levy, MSW [phone]

## 2012-07-30 NOTE — Progress Notes (Signed)
Patient ID: Lori Wiggins, female   DOB: Aug 03, 1928, 79 y.o.   MRN: [id number] 9 Days Post-Op  Subjective: Continues to feel well. Tolerating full diet this morning. Had BM yesterday morning.  Objective: Vital signs in last 24 hours: Temp:  [98.3 F (36.8 C)-98.8 F (37.1 C)] 98.3 F (36.8 C) (07/30 0503) Pulse Rate:  [76-89] 78 (07/30 0503) Resp:  [18] 18 (07/30 0503) BP: (138-167)/(57-65) 167/65 mmHg (07/30 0503) SpO2:  [98 %] 98 % (07/30 0503) Last BM Date: 07/29/12  PE: Abd: soft, non-tender, non-distended  Lab Results:  No results found for this basename: WBC, HGB, HCT, PLT,  in the last 72 hours BMET  Recent Labs  07/28/12 0500 07/29/12 0511  NA 126* 129*  K 3.5 2.9*  CL 95* 97  CO2 21 23  GLUCOSE 77 92  BUN 11 8  CREATININE 0.52 0.50  CALCIUM 8.1* 8.1*   PT/INR No results found for this basename: LABPROT, INR,  in the last 72 hours CMP     Component Value Date/Time   NA 129* 07/29/2012 0511   K 2.9* 07/29/2012 0511   CL 97 07/29/2012 0511   CO2 23 07/29/2012 0511   GLUCOSE 92 07/29/2012 0511   BUN 8 07/29/2012 0511   CREATININE 0.50 07/29/2012 0511   CALCIUM 8.1* 07/29/2012 0511   PROT 5.2* 07/26/2012 0250   ALBUMIN 2.1* 07/26/2012 0250   AST 24 07/26/2012 0250   ALT 14 07/26/2012 0250   ALKPHOS 63 07/26/2012 0250   BILITOT 0.5 07/26/2012 0250   GFRNONAA 88* 07/29/2012 0511   GFRAA >90 07/29/2012 0511   Lipase  No results found for this basename: lipase    Studies/Results: Dg Abd 1 View  07/28/2012   *RADIOLOGY REPORT*  Clinical Data: Follow up small bowel obstruction  ABDOMEN - 1 VIEW  Comparison: 07/26/2012  Findings: There is a nasogastric tube with tip in the proximal stomach.  The side port is just above the GE junction.  There is been interval improvement of previous small bowel dilatation.  Enteric contrast material has progressed into the colon.  No free air.  IMPRESSION:  1.  Improvement small bowel obstruction pattern.   Original Report Authenticated  By: Signa Kell, M.D.    Anti-infectives: Anti-infectives   Start     Dose/Rate Route Frequency Ordered Stop   07/24/12 1430  ceFAZolin (ANCEF) IVPB 1 g/50 mL premix     1 g 100 mL/hr over 30 Minutes Intravenous 3 times per day 07/24/12 1409 07/26/12 0606   07/23/12 1800  cephALEXin (KEFLEX) capsule 250 mg  Status:  Discontinued     250 mg Oral 4 times per day 07/23/12 1713 07/24/12 1409   07/21/12 2000  ciprofloxacin (CIPRO) tablet 500 mg  Status:  Discontinued     500 mg Oral 2 times daily 07/21/12 1432 07/23/12 1713   07/21/12 1019  cefUROXime (ZINACEF) injection  Status:  Discontinued       As needed 07/21/12 1020 07/21/12 1110   07/21/12 0600  ceFAZolin (ANCEF) IVPB 2 g/50 mL premix     2 g 100 mL/hr over 30 Minutes Intravenous On call to O.R. 07/20/12 1403 07/21/12 0935       Assessment/Plan 1. PSBO vs ileus 2. S/p TKR  Plan: 1. Improved. Ok for discharge from surgery perspective as has tolerated full diet this morning.   LOS: 9 days    Marikay Alar 07/30/2012, 7:56 AM

## 2012-07-30 NOTE — Progress Notes (Signed)
SNF bed accepted and [employer] auth rec'd for SNF bed at [facility]. Awaiting MD for final d/c clearance and d/c summary-  Reece Levy, MSW [phone]

## 2012-07-31 ENCOUNTER — Non-Acute Institutional Stay (SKILLED_NURSING_FACILITY): Payer: Medicare Other | Admitting: Adult Health

## 2012-07-31 ENCOUNTER — Encounter: Payer: Self-pay | Admitting: Adult Health

## 2012-07-31 DIAGNOSIS — E039 Hypothyroidism, unspecified: Secondary | ICD-10-CM | POA: Insufficient documentation

## 2012-07-31 DIAGNOSIS — M1712 Unilateral primary osteoarthritis, left knee: Secondary | ICD-10-CM

## 2012-07-31 DIAGNOSIS — G4733 Obstructive sleep apnea (adult) (pediatric): Secondary | ICD-10-CM | POA: Insufficient documentation

## 2012-07-31 DIAGNOSIS — I10 Essential (primary) hypertension: Secondary | ICD-10-CM

## 2012-07-31 NOTE — Assessment & Plan Note (Signed)
Will continue synthroid 50 mcg daily  

## 2012-07-31 NOTE — Assessment & Plan Note (Signed)
Is stable uses cpap at night; will not make changes.

## 2012-07-31 NOTE — Progress Notes (Signed)
Patient ID: Lori Wiggins, female   DOB: July 08, 1928, 79 y.o.   MRN: [id number]  [facility]  Allergies  Allergen Reactions  . Sulfa Antibiotics Other (See Comments)    Unknown    . Nsaids Rash    Pt. does not want to take at all.     Chief Complaint  Patient presents with  . Hospitalization Follow-up    HPI: She was hospitalized for a left knee replacement. She had a post op ileus; with hyponatremia and hypokalemia which has resolved. She is here for short term rehab and will return back home.   Past Medical History  Diagnosis Date  . Hypertension   . Hypothyroidism   . Sleep apnea     uses CPAP  . GERD (gastroesophageal reflux disease)   . Arthritis   . Breast cancer     left  breast    Past Surgical History  Procedure Laterality Date  . Cardiac catheterization      10 years ago  . Breast surgery Left 2012    Lumpectomy  . Tonsillectomy    . Appendectomy    . Abdominal hysterectomy    . Total knee arthroplasty Right 07/21/2012    Dr Turner Daniels  . Total knee arthroplasty Left 07/21/2012    Procedure: TOTAL KNEE ARTHROPLASTY;  Surgeon: Nestor Lewandowsky, MD;  Location: [facility] OR;  Service: Orthopedics;  Laterality: Left;    VITAL SIGNS BP 138/88  Pulse 70  Ht 5\' 2"  (1.575 m)  Wt 139 lb 12.8 oz (63.413 kg)  BMI 25.56 kg/m2   Patient's Medications  New Prescriptions   No medications on file  Previous Medications   CALCIUM-VITAMIN D-VITAMIN K (CALCIUM + D + K PO)    Take 1 tablet by mouth daily.   CHOLECALCIFEROL (VITAMIN D3) 5000 UNITS TABS    Take 5,000 Units by mouth daily.   COENZYME Q10 (CO Q-10) 100 MG CAPS    Take 100 mg by mouth daily.   ENOXAPARIN (LOVENOX) 30 MG/0.3ML INJECTION    Inject 0.3 mLs (30 mg total) into the skin every 12 (twelve) hours.   GRAPE SEED 100 MG CAPS    Take 100 mg by mouth daily.   LEVOTHYROXINE (SYNTHROID, LEVOTHROID) 50 MCG TABLET    Take 50 mcg by mouth daily before breakfast.   MULTIPLE VITAMIN (MULTIVITAMIN WITH MINERALS) TABS     Take 1 tablet by mouth daily.   OMEGA-3 FATTY ACIDS (OMEGA-3 PO)    Take 360 mg by mouth daily.   OXYCODONE-ACETAMINOPHEN (ROXICET) 5-325 MG PER TABLET    Take 1 tablet by mouth every 4 (four) hours as needed for pain.   TIZANIDINE (ZANAFLEX) 2 MG CAPSULE    Take 2 capsules (4 mg total) by mouth 3 (three) times daily as needed for muscle spasms.   VALSARTAN-HYDROCHLOROTHIAZIDE (DIOVAN-HCT) 160-25 MG PER TABLET    Take 1 tablet by mouth daily.   WARFARIN (COUMADIN) 5 MG TABLET    Take 1 tablet (5 mg total) by mouth daily.  Modified Medications   No medications on file  Discontinued Medications   CIPROFLOXACIN (CIPRO) 500 MG TABLET    Take 500 mg by mouth 2 (two) times daily.    SIGNIFICANT DIAGNOSTIC EXAMS   07-16-12: chest x-ray: 1.  No acute cardiopulmonary disease.2.  Mild interstitial coarsening appears chronic.  07-24-12: kub: Small bowel obstruction likely in the distal ileum.  CT may be helpful for further evaluation.   07-26-12: ct of abdomen and pelvis:  1.   Findings compatible with small bowel obstruction with transition point located within the mid aspect of the abdomen.  The etiology of the obstruction is not depicted on this examination andthus presumably secondary to adhesions.  No evidence of Perforation. 2.  Small amount of intra-abdominal ascites - the majority of fluid located adjacent to the liver and within the pelvic cul-de-sac.  No evidence of discrete/drainable intra-abdominal abscess. 3.  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   07-28-12: kub: 1.  Improvement small bowel obstruction pattern.      LABS REVIEWED:   07-24-12: tsh 0.843 07-27-12: wbc 13.7; hgb 9.0; hct 25.0; mcv 81.2; plt 240;  07-30-12: glucose 107; bun 5; creat 0.53; k+ 3.3; na++ 129  07-31-12: wbc 9.7; hgb 9.4; hct 29.0;mcv 87.3; plt 542; glucose 107; bun 12; creat 0.6; k+4.0 Na++ 131; tsh 2.110  Review of Systems  Constitutional:  Negative for malaise/fatigue.  Respiratory: Negative for cough and shortness of breath.   Cardiovascular: Negative for chest pain, palpitations and leg swelling.  Gastrointestinal: Negative for heartburn, nausea, abdominal pain, diarrhea and constipation.  Musculoskeletal: Negative for myalgias and joint pain.  Skin: Negative.   Neurological: Negative for headaches.  Psychiatric/Behavioral: Negative for depression. The patient is not nervous/anxious and does not have insomnia.     Physical Exam  Constitutional: She is oriented to person, place, and time. She appears well-developed and well-nourished.  Neck: Neck supple. No JVD present. No thyromegaly present.  Cardiovascular: Normal rate, regular rhythm and intact distal pulses.   Respiratory: Effort normal and breath sounds normal. No respiratory distress. She has no wheezes.  GI: Soft. Bowel sounds are normal. She exhibits no distension. There is no tenderness.  Musculoskeletal: Normal range of motion. She exhibits no edema.  Is using wheelchair  Neurological: She is alert and oriented to person, place, and time.  Skin: Skin is warm and dry.  Incision line without signs of infection present       ASSESSMENT/ PLAN:  Arthritis of knee, left Is status post left knee replacement will continue to be seen by therapy as directed; will conitnue percocet 5/325 mg every 4 hours as needed and zanaflex 4 mg three times daily as needed and will monitor her status; her inr is 1.1 and she is taking coumadin 5 mg daily and lovenox 30 g twice daily; will increase her coumadin to 6 mg daily and will check inr on 08-04-12.   Essential hypertension, benign She is stable will continue diovan hct 160/25 mg daily and will monitor   Unspecified hypothyroidism Will continue synthroid 50 mcg daily   Obstructive sleep apnea Is stable uses cpap at night; will not make changes.      Time spent with patient: 50 minutes

## 2012-07-31 NOTE — Assessment & Plan Note (Signed)
Is status post left knee replacement will continue to be seen by therapy as directed; will conitnue percocet 5/325 mg every 4 hours as needed and zanaflex 4 mg three times daily as needed and will monitor her status; her inr is 1.1 and she is taking coumadin 5 mg daily and lovenox 30 g twice daily; will increase her coumadin to 6 mg daily and will check inr on 08-04-12.

## 2012-07-31 NOTE — Assessment & Plan Note (Signed)
She is stable will continue diovan hct 160/25 mg daily and will monitor

## 2012-08-04 ENCOUNTER — Non-Acute Institutional Stay (SKILLED_NURSING_FACILITY): Payer: Medicare Other | Admitting: Internal Medicine

## 2012-08-04 DIAGNOSIS — K5900 Constipation, unspecified: Secondary | ICD-10-CM

## 2012-08-04 DIAGNOSIS — E039 Hypothyroidism, unspecified: Secondary | ICD-10-CM

## 2012-08-04 DIAGNOSIS — G4733 Obstructive sleep apnea (adult) (pediatric): Secondary | ICD-10-CM

## 2012-08-04 DIAGNOSIS — M1710 Unilateral primary osteoarthritis, unspecified knee: Secondary | ICD-10-CM

## 2012-08-04 DIAGNOSIS — M1712 Unilateral primary osteoarthritis, left knee: Secondary | ICD-10-CM

## 2012-08-04 DIAGNOSIS — I10 Essential (primary) hypertension: Secondary | ICD-10-CM

## 2012-08-04 NOTE — Progress Notes (Signed)
Patient ID: Lori Wiggins, female   DOB: 08-04-1928, 79 y.o.   MRN: [id number]  [facility]  PCP: Pcp Not In System  Code Status: full code  Allergies  Allergen Reactions  . Sulfa Antibiotics Other (See Comments)    Unknown    . Nsaids Rash    Pt. does not want to take at all.    Chief Complaint  Patient presents with  . Hospitalization Follow-up    HPI:  79 y/o female patient with severe OA underwent left total knee arthroplasty and is here for STR. She was seen in her room today. She is in no distress. She denies any complaint. She has not required any of her percocet. Her pain is controlled with tylenol. She has not had a bowel movement for 3 days. Has been passing gas. No other complaints  Review of Systems  Constitutional: Negative for fever and chills.  HENT: Negative for congestion.   Eyes: Negative for blurred vision.  Respiratory: Negative for cough and shortness of breath.   Cardiovascular: Negative for chest pain, palpitations, leg swelling and PND.  Gastrointestinal: Negative for heartburn, nausea, vomiting and abdominal pain.  Genitourinary: Negative for dysuria.  Musculoskeletal: Negative for falls.  Skin: Negative for rash.  Neurological: Negative for dizziness, weakness and headaches.  Psychiatric/Behavioral: Negative for depression.     Past Medical History  Diagnosis Date  . Hypertension   . Hypothyroidism   . Sleep apnea     uses CPAP  . GERD (gastroesophageal reflux disease)   . Arthritis   . Breast cancer     left  breast   Past Surgical History  Procedure Laterality Date  . Cardiac catheterization      10 years ago  . Breast surgery Left 2012    Lumpectomy  . Tonsillectomy    . Appendectomy    . Abdominal hysterectomy    . Total knee arthroplasty Right 07/21/2012    Dr Turner Daniels  . Total knee arthroplasty Left 07/21/2012    Procedure: TOTAL KNEE ARTHROPLASTY;  Surgeon: Nestor Lewandowsky, MD;  Location: [facility] OR;  Service: Orthopedics;   Laterality: Left;   Social History:   reports that she has never smoked. She has never used smokeless tobacco. She reports that  drinks alcohol. She reports that she does not use illicit drugs.  No family history on file.  Medications: Patient's Medications  New Prescriptions   No medications on file  Previous Medications   CALCIUM-VITAMIN D-VITAMIN K (CALCIUM + D + K PO)    Take 1 tablet by mouth daily.   CHOLECALCIFEROL (VITAMIN D3) 5000 UNITS TABS    Take 5,000 Units by mouth daily.   COENZYME Q10 (CO Q-10) 100 MG CAPS    Take 100 mg by mouth daily.   ENOXAPARIN (LOVENOX) 30 MG/0.3ML INJECTION    Inject 0.3 mLs (30 mg total) into the skin every 12 (twelve) hours.   GRAPE SEED 100 MG CAPS    Take 100 mg by mouth daily.   LEVOTHYROXINE (SYNTHROID, LEVOTHROID) 50 MCG TABLET    Take 50 mcg by mouth daily before breakfast.   MULTIPLE VITAMIN (MULTIVITAMIN WITH MINERALS) TABS    Take 1 tablet by mouth daily.   OMEGA-3 FATTY ACIDS (OMEGA-3 PO)    Take 360 mg by mouth daily.   OXYCODONE-ACETAMINOPHEN (ROXICET) 5-325 MG PER TABLET    Take 1 tablet by mouth every 4 (four) hours as needed for pain.   TIZANIDINE (ZANAFLEX) 2 MG CAPSULE  Take 2 capsules (4 mg total) by mouth 3 (three) times daily as needed for muscle spasms.   VALSARTAN-HYDROCHLOROTHIAZIDE (DIOVAN-HCT) 160-25 MG PER TABLET    Take 1 tablet by mouth daily.   WARFARIN (COUMADIN) 5 MG TABLET    Take 1 tablet (5 mg total) by mouth daily.  Modified Medications   No medications on file  Discontinued Medications   No medications on file     Physical Exam: Filed Vitals:   08/04/12 1722  BP: 138/63  Pulse: 66  Temp: 96.5 F (35.8 C)  Resp: 16  SpO2: 98%   Constitutional: She is oriented to person, place, and time. She appears well-developed and well-nourished.  Neck: Neck supple. No JVD present. No thyromegaly present.  Cardiovascular: Normal rate, regular rhythm and intact distal pulses.   Respiratory: Effort normal and  breath sounds normal. No respiratory distress. She has no wheezes.  GI: Soft. Bowel sounds are normal. She exhibits no distension. There is no tenderness.  Musculoskeletal: Normal range of motion. She exhibits no edema.  Is using wheelchair  Neurological: She is alert and oriented to person, place, and time.  Skin: Skin is warm and dry.  Incision line has sutures in place and without signs of infection present    Labs reviewed: Basic Metabolic Panel:  Recent Labs  16/10/96 0500 07/29/12 0511 07/29/12 0742 07/30/12 0634  NA 126* 129*  --  129*  K 3.5 2.9*  --  3.3*  CL 95* 97  --  95*  CO2 21 23  --  24  GLUCOSE 77 92  --  107*  BUN 11 8  --  5*  CREATININE 0.52 0.50  --  0.53  CALCIUM 8.1* 8.1*  --  8.5  MG  --   --  1.8  --    Liver Function Tests:  Recent Labs  07/26/12 0250  AST 24  ALT 14  ALKPHOS 63  BILITOT 0.5  PROT 5.2*  ALBUMIN 2.1*   No results found for this basename: LIPASE, AMYLASE,  in the last 8760 hours No results found for this basename: AMMONIA,  in the last 8760 hours CBC:  Recent Labs  07/16/12 0958  07/23/12 0520 07/24/12 0450 07/27/12 0625  WBC 7.0  < > 13.0* 14.4* 13.7*  NEUTROABS 4.7  --   --   --   --   HGB 13.2  < > 9.3* 9.7* 9.0*  HCT 38.3  < > 26.3* 26.3* 25.0*  MCV 85.5  < > 81.9 80.2 81.2  PLT 174  < > 147* 178 240  < > = values in this interval not displayed.  08/04/12 inr 2.7  07-16-12: chest x-ray: 1.  No acute cardiopulmonary disease.2.  Mild interstitial coarsening appears chronic.  07-24-12: kub: Small bowel obstruction likely in the distal ileum.  CT may be helpful for further evaluation.   07-26-12: ct of abdomen and pelvis:  1.   Findings compatible with small bowel obstruction with transition point located within the mid aspect of the abdomen.  The etiology of the obstruction is not depicted on this examination andthus presumably secondary to adhesions.  No evidence of Perforation. 2.  Small amount of intra-abdominal  ascites - the majority of fluid located adjacent to the liver and within the pelvic cul-de-sac.  No evidence of discrete/drainable intra-abdominal abscess. 3.  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   07-28-12: kub: 1.  Improvement small  bowel obstruction pattern.  Assessment/Plan  Arthritis of knee, left status post left knee replacement and to work with PT/OT. Fall precautions. D/c percocet. To follow with orthopedics. Continue zanaflex to help with muscle spasm. Continue coumadin for anticoagulation. With inr 2.7 today, will d/c lovenox and continue coumadin at current dose and recheck inr 08/06/12. Continue ca-vit d supplement  Constipation Will start her on miralax x1 now and give miralax daily for constipation, hold for loose stool   Essential hypertension, benign continue diovan hctz 160/25 mg daily and will monitor bp readings  Unspecified hypothyroidism Will continue synthroid 50 mcg daily   Obstructive sleep apnea Is stable and uses cpap at night.   Family/ staff Communication: reviewed care plan with patient and nursing supervisor   Goals of care: to return home   Labs/tests ordered- cbc, cmp , inr

## 2012-10-20 ENCOUNTER — Ambulatory Visit: Payer: Self-pay | Admitting: Oncology

## 2012-10-20 LAB — COMPREHENSIVE METABOLIC PANEL
Albumin: 3.6 g/dL (ref 3.4–5.0)
Alkaline Phosphatase: 96 U/L (ref 50–136)
BUN: 19 mg/dL — ABNORMAL HIGH (ref 7–18)
Bilirubin,Total: 0.4 mg/dL (ref 0.2–1.0)
Chloride: 99 mmol/L (ref 98–107)
Creatinine: 0.83 mg/dL (ref 0.60–1.30)
EGFR (African American): >60
Potassium: 3.9 mmol/L (ref 3.5–5.1)
SGPT (ALT): 21 U/L (ref 12–78)
Sodium: 134 mmol/L — ABNORMAL LOW (ref 136–145)
Total Protein: 6.7 g/dL (ref 6.4–8.2)

## 2012-10-20 LAB — CBC CANCER CENTER
Basophil %: 1 %
Eosinophil #: 0.2 x10 3/mm (ref 0.0–0.7)
Eosinophil %: 1.9 %
HGB: 12.5 g/dL (ref 12.0–16.0)
Lymphocyte #: 1.6 x10 3/mm (ref 1.0–3.6)
Lymphocyte %: 17.4 %
Monocyte %: 7.3 %
RBC: 4.26 10*6/uL (ref 3.80–5.20)
RDW: 15.9 % — ABNORMAL HIGH (ref 11.5–14.5)

## 2012-11-01 ENCOUNTER — Ambulatory Visit: Payer: Self-pay | Admitting: Oncology

## 2012-11-24 ENCOUNTER — Ambulatory Visit (INDEPENDENT_AMBULATORY_CARE_PROVIDER_SITE_OTHER): Payer: Medicare Other | Admitting: Podiatry

## 2012-11-24 ENCOUNTER — Encounter: Payer: Self-pay | Admitting: Podiatry

## 2012-11-24 VITALS — BP 122/75 | HR 71 | Resp 16 | Ht 62.0 in | Wt 127.0 lb

## 2012-11-24 DIAGNOSIS — M79609 Pain in unspecified limb: Secondary | ICD-10-CM

## 2012-11-24 DIAGNOSIS — B351 Tinea unguium: Secondary | ICD-10-CM

## 2012-11-24 NOTE — Progress Notes (Signed)
Nature presents today with a chief complaint of painful toenails one through 5 bilateral.  Objective: Pulses remain palpable bilateral foot, nails are thick yellow dystrophic onychomycotic and painful palpation as well as debridement.  Assessment: Pain in limb secondary to onychomycosis bilateral.  Plan: Debridement of nails in thickness and length as cover service secondary to pain followup with her in 3 months

## 2013-02-16 ENCOUNTER — Encounter: Payer: Self-pay | Admitting: Podiatry

## 2013-02-16 ENCOUNTER — Ambulatory Visit (INDEPENDENT_AMBULATORY_CARE_PROVIDER_SITE_OTHER): Payer: Medicare PPO | Admitting: Podiatry

## 2013-02-16 VITALS — BP 123/77 | HR 73 | Resp 18

## 2013-02-16 DIAGNOSIS — B351 Tinea unguium: Secondary | ICD-10-CM

## 2013-02-16 DIAGNOSIS — M79609 Pain in unspecified limb: Secondary | ICD-10-CM

## 2013-02-16 NOTE — Progress Notes (Signed)
She presents today chief complaint of painful toenails one through 5 bilateral.  Objective: Pulses are palpable bilateral. Nails are thick yellow dystrophic lytic mycotic and painful palpation.  Assessment: Pain in limb secondary to painful toenails.  Plan: Debridement nails 1 through 5 bilateral.

## 2013-04-15 ENCOUNTER — Ambulatory Visit (INDEPENDENT_AMBULATORY_CARE_PROVIDER_SITE_OTHER): Payer: Medicare PPO | Admitting: Podiatry

## 2013-04-15 ENCOUNTER — Encounter: Payer: Self-pay | Admitting: Podiatry

## 2013-04-15 VITALS — BP 122/76 | HR 72 | Resp 16

## 2013-04-15 DIAGNOSIS — S90129A Contusion of unspecified lesser toe(s) without damage to nail, initial encounter: Secondary | ICD-10-CM

## 2013-04-15 DIAGNOSIS — S90229A Contusion of unspecified lesser toe(s) with damage to nail, initial encounter: Secondary | ICD-10-CM

## 2013-04-15 NOTE — Progress Notes (Signed)
She presents today concerned with the [place] or toenail hallux left.  Objective: Vital signs are stable she is alert and oriented x3 pulses are palpable. She has hallux extensus and this is resulting in rubbing of her shoes thus a subungual hematoma.  Assessment: Subungual hematoma hallux left.  Plan: Followup with her as needed.

## 2013-04-27 ENCOUNTER — Ambulatory Visit: Payer: Self-pay | Admitting: Oncology

## 2013-05-01 ENCOUNTER — Ambulatory Visit: Payer: Self-pay | Admitting: Oncology

## 2013-05-18 ENCOUNTER — Ambulatory Visit (INDEPENDENT_AMBULATORY_CARE_PROVIDER_SITE_OTHER): Payer: Medicare PPO | Admitting: Podiatry

## 2013-05-18 VITALS — BP 164/90 | HR 81 | Resp 16

## 2013-05-18 DIAGNOSIS — B351 Tinea unguium: Secondary | ICD-10-CM

## 2013-05-18 DIAGNOSIS — M79609 Pain in unspecified limb: Secondary | ICD-10-CM

## 2013-05-18 NOTE — Progress Notes (Signed)
She presents today with a chief complaint of painful elongated toenails.  Objective: Vital signs are stable she is alert and oriented x3. Pulses are strongly palpable bilateral.  Assessment: Pain in limb secondary to onychomycosis 1 through 5 bilateral.  Plan: Debridement of nails in thickness and length as cover service secondary to pain. 

## 2013-08-17 ENCOUNTER — Ambulatory Visit (INDEPENDENT_AMBULATORY_CARE_PROVIDER_SITE_OTHER): Payer: Medicare PPO

## 2013-08-17 ENCOUNTER — Ambulatory Visit (INDEPENDENT_AMBULATORY_CARE_PROVIDER_SITE_OTHER): Payer: Medicare PPO | Admitting: Podiatry

## 2013-08-17 ENCOUNTER — Encounter: Payer: Self-pay | Admitting: Podiatry

## 2013-08-17 DIAGNOSIS — M79609 Pain in unspecified limb: Secondary | ICD-10-CM

## 2013-08-17 DIAGNOSIS — M79676 Pain in unspecified toe(s): Secondary | ICD-10-CM

## 2013-08-17 DIAGNOSIS — B351 Tinea unguium: Secondary | ICD-10-CM

## 2013-08-17 DIAGNOSIS — M779 Enthesopathy, unspecified: Secondary | ICD-10-CM

## 2013-08-17 NOTE — Progress Notes (Signed)
She presents today chief complaint of painful he ingrown nails and painful thick nails. She's also complaining of right foot pain to the dorsal aspect.  Objective: Vital signs are stable she is alert and oriented x3 she has pain on palpation dorsal aspect dorsal lateral aspect of the right foot. Pulses remain palpable. Radiographically evaluation confirms osteoarthritis. Otherwise nails are thick yellow dystrophic with mycotic and painful palpation.  Assessment: Osteoarthritis dorsal lateral aspect right foot. Pain in limb secondary to onychomycosis 1 through 5 bilateral.  Plan: Debridement of nails 1 through 5 bilateral covered service secondary to pain. Injected the dorsal lateral aspect of her right foot with Kenalog and local anesthetic.

## 2013-10-20 ENCOUNTER — Ambulatory Visit: Payer: Self-pay | Admitting: Oncology

## 2013-11-04 ENCOUNTER — Ambulatory Visit: Payer: Self-pay | Admitting: Oncology

## 2013-11-04 LAB — COMPREHENSIVE METABOLIC PANEL
ALBUMIN: 3.6 g/dL (ref 3.4–5.0)
ALK PHOS: 87 U/L
ALT: 21 U/L
ANION GAP: 9 (ref 7–16)
BUN: 17 mg/dL (ref 7–18)
Bilirubin,Total: 0.5 mg/dL (ref 0.2–1.0)
Calcium, Total: 9.2 mg/dL (ref 8.5–10.1)
Chloride: 99 mmol/L (ref 98–107)
Co2: 26 mmol/L (ref 21–32)
Creatinine: 0.85 mg/dL (ref 0.60–1.30)
EGFR (African American): >60
EGFR (Non-African Amer.): >60
GLUCOSE: 96 mg/dL (ref 65–99)
Osmolality: 270 (ref 275–301)
POTASSIUM: 4 mmol/L (ref 3.5–5.1)
SGOT(AST): 15 U/L (ref 15–37)
Sodium: 134 mmol/L — ABNORMAL LOW (ref 136–145)
Total Protein: 7 g/dL (ref 6.4–8.2)

## 2013-11-04 LAB — CBC CANCER CENTER
BASOS ABS: 0.1 x10 3/mm (ref 0.0–0.1)
Basophil %: 0.7 %
EOS PCT: 1.9 %
Eosinophil #: 0.1 x10 3/mm (ref 0.0–0.7)
HCT: 41.2 % (ref 35.0–47.0)
HGB: 13.7 g/dL (ref 12.0–16.0)
Lymphocyte #: 1.3 x10 3/mm (ref 1.0–3.6)
Lymphocyte %: 18.4 %
MCH: 28.9 pg (ref 26.0–34.0)
MCHC: 33.2 g/dL (ref 32.0–36.0)
MCV: 87 fL (ref 80–100)
MONOS PCT: 7.7 %
Monocyte #: 0.6 x10 3/mm (ref 0.2–0.9)
Neutrophil #: 5.1 x10 3/mm (ref 1.4–6.5)
Neutrophil %: 71.3 %
Platelet: 179 x10 3/mm (ref 150–440)
RBC: 4.73 10*6/uL (ref 3.80–5.20)
RDW: 15.4 % — ABNORMAL HIGH (ref 11.5–14.5)
WBC: 7.2 x10 3/mm (ref 3.6–11.0)

## 2013-11-16 ENCOUNTER — Ambulatory Visit (INDEPENDENT_AMBULATORY_CARE_PROVIDER_SITE_OTHER): Payer: Medicare PPO | Admitting: Podiatry

## 2013-11-16 DIAGNOSIS — M79676 Pain in unspecified toe(s): Secondary | ICD-10-CM

## 2013-11-16 DIAGNOSIS — B351 Tinea unguium: Secondary | ICD-10-CM

## 2013-11-16 NOTE — Progress Notes (Signed)
Presents today chief complaint of painful elongated toenails.  Objective: Pulses are palpable bilateral nails are thick, yellow dystrophic onychomycosis and painful palpation.   Assessment: Onychomycosis with pain in limb.  Plan: Treatment of nails in thickness and length as covered service secondary to pain.  

## 2013-12-01 ENCOUNTER — Ambulatory Visit: Payer: Self-pay | Admitting: Oncology

## 2014-02-15 ENCOUNTER — Ambulatory Visit: Payer: Medicare PPO | Admitting: Podiatry

## 2014-02-15 ENCOUNTER — Ambulatory Visit: Payer: Medicare PPO

## 2014-02-17 ENCOUNTER — Ambulatory Visit: Payer: Medicare PPO | Admitting: Podiatry

## 2014-02-22 ENCOUNTER — Ambulatory Visit (INDEPENDENT_AMBULATORY_CARE_PROVIDER_SITE_OTHER): Payer: PPO | Admitting: Podiatry

## 2014-02-22 DIAGNOSIS — B351 Tinea unguium: Secondary | ICD-10-CM

## 2014-02-22 DIAGNOSIS — M79676 Pain in unspecified toe(s): Secondary | ICD-10-CM

## 2014-02-22 MED ORDER — MELOXICAM 15 MG PO TABS: 15.0000 mg | ORAL_TABLET | Freq: Every day | ORAL | Status: DC

## 2014-02-22 NOTE — Progress Notes (Signed)
She presents today with a chief complaint of painful elongated toenails.  Objective: Vital signs are stable she is alert and oriented x3. Pulses are strongly palpable bilateral.  Assessment: Pain in limb secondary to onychomycosis 1 through 5 bilateral.  Plan: Debridement of nails in thickness and length as cover service secondary to pain.

## 2014-04-25 NOTE — Consult Note (Signed)
Reason for Visit: This 81 year old Female patient presents to the clinic for initial evaluation of  Ductal carcinoma in situ .   Referred by Dr. [facility].  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 There was in the left breast at the medial aspect a 7 mm asymmetry. Patient underwent excisional biopsy which was positive for ductal carcinoma in situ. Margins were positive. Tumor was ER/PR positive. She was returned to surgery and underwent wide local excision with no residual carcinoma seen. One sentinel lymph node was negative for metastatic disease. She is now referred to radiation oncology for consideration of adjuvant treatment. She is doing well. Having some slight tenderness still in an incisional site. She is otherwise without complaint.   Past Hx:    Cancer, Left Breast:    Hypertension:   Past, Family and Social History:   Past Medical History positive    Cardiovascular hypertension    Past Surgical History Breast surgery as described above.    Family History noncontributory    Social History noncontributory    Additional Past Medical and Surgical History Accompanied by husband today   Allergies:   No Known Allergies:   Home Meds:  Home Medications:  Diovan HCT 80 mg-12.5 mg oral tablet: 1 tab(s) orally once a day, Active  Synthroid 50 mcg (0.05 mg) oral tablet: 1 tab(s) orally once a day, Active  Elderberry 400 mg. Capsule: 1 cap(s) orally once a day, Active  Bioten 5000 mcg.. oral tablets: 1 tab(s) orally once a  day, Active  Co Q-10 100 mg oral capsule: 1 cap(s) orally once a day, Active  Magnesium  with Zinc 265/10 mg. oral tablets: 1 tab(s) orally once a day, Active  Vitamin D3 5000 intl units oral capsule: 1 cap(s) orally once a day, Active  Calcium 500 mg. oral tablets. : 1 tab(s) orally 2 times a day, Active  Multiple Vitamins: 1 tab(s) orally once a day, Active  Omega Fish Oil 1200 mg. Oral Capsules : 1 cap(s) orally 2 times a day, Active  Grape Seed Extract : 1 cap(s) orally once a day, Active  Review of Systems:   General negative    Performance Status (ECOG) 0    Skin negative    Breast see HPI    Ophthalmologic negative    ENMT negative    Respiratory and Thorax negative    Cardiovascular negative    Gastrointestinal negative    Genitourinary negative    Musculoskeletal negative    Neurological negative    Psychiatric negative    Hematology/Lymphatics negative    Endocrine negative    Allergic/Immunologic negative   Nursing Notes:  Nursing Vital Signs and Chemo Nursing Nursing Notes: *CC Vital Signs Flowsheet:   04-Jan-13 09:27   Temp Temperature 96.1   Pulse Pulse 68   Respirations Respirations 20   SBP SBP 151   DBP DBP 95   Pain Scale (0-10)  0   Current Weight (kg) (kg) 59.2   Height (cm) centimeters 160.7   BSA (m2) 1.6  Physical Exam:  General/Skin/HEENT:   General normal    Eyes normal    ENMT normal    Head and Neck normal    Additional PE Well-developed well-nourished female in NAD. She status post wide local excision of the left breast. Incision is healed well although there slight erythematous changes of the skin surrounding the scar site which may be some early mastitis. It is tender to touch. No axillary or supraclavicular adenopathy is appreciated. Right breast is free of dominant mass or nodularity into position examined. No axillary or supraclavicular adenopathy is appreciated. Lungs are clear to A&P cardiac examination is  essentially unremarkable with regular rate and rhythm.   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 Would plan on delivering 5000 cGy over 5 weeks and then boost to her scar another 1400 cGy. I am a little concerned she may have some early mastitis Saum empirically started her on Keflex 500 mg twice a day for the next 7 days. She is to see her surgeon early next week for followup. I have set her up for CT simulation early next week. I will treat her according to [facility] guidelines for radiation dosage. Risks and benefits of treatment and were reviewed with the patient and her husband and both seem to comprehend her treatment plan well. I have also had a discussion with her about tamoxifen therapy which I would recommend and she will have that discussion with her surgeon down the road after completion of radiation.  I would like to take this opportunity to thank you for allowing me to continue to participate in this patient's care.   CC Referral:   cc: Dr. Brunetta Genera, Dr. Jun[facility]   Electronic Signatures: Armstead Peaks (MD)  (Signed 04-Jan-13 11:57)  Authored: HPI, Diagnosis, Past Hx, PFSH, Allergies, Home Meds, ROS, Nursing Notes, Physical Exam, Encounter Assessment and Plan, CC Referring Physician   Last Updated: 04-Jan-13 11:57 by Armstead Peaks (MD)

## 2014-04-25 NOTE — Consult Note (Signed)
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  Patient is here for consideration of anti-hormonal therapy. 2.  Started on letrozole 2.5 mg by mouth daily (April, 2013) ??  Summary of Treatment  site treated left breast tumor dose 5040 cGycGy in 28 fractions Start date 01/29/11 Complete date 03/22/11 Using 6 MV photons three-dimensional treatment planning  Site treated scar boost Tumor dose 1440 cGy in 8 fractions Start date 03/05/11 Completed 03/14/11 Using 12 electronss en face prescribed to 90% isodose 3.    Pathology Report Pathology report reviewed    Imaging Report Mammograms requested for my review    Referral Report Clinical notes reviewed    Planned Treatment Regimen Adjuvant radiation therapy to her left breast    HPI   Patient is a 81 year old female who presents with asymmetry on her most recent mammogram. There was in the left breast at the medial aspect a 7 mm asymmetry. Patient underwent excisional biopsy which was positive for ductal carcinoma in situ. Margins were positive. Tumor was ER/PR positive. She was returned to surgery and underwent wide local excision with no residual carcinoma seen. One sentinel lymph node was negative for metastatic disease. She is now referred to radiation oncology for consideration of adjuvant treatment. patient has finished radiation treatment,and now referred for possibility ofanti-hormomal therapy. patient is extremely apprehensive.  PFSH:   Comments No family history of colorectal cancer, breast cancer, or ovarian cancer.    Comments Patient does not drink or smoke.    Additional Past Medical and Surgical History No significant past medical history   Review of Systems:   General denies complaints     Performance Status (ECOG) 0    HEENT no complaints    Lungs no complaints    Cardiac no complaints    GI no complaints    GU no complaints    Musculoskeletal no complaints    Extremities no complaints    Skin no complaints    Neuro no complaints    Endocrine no complaints    Psych anxiety    Review of Systems   redness of the left breast at the site of radiation treatment and surgical site  NURSING NOTES: *CC Vital Signs Flowsheet:   19-Mar-13 14:30    Temp Temperature: 96.4    Pulse Pulse: 79    Respirations Respirations: 20    SBP SBP: 135    DBP DBP: 63    Current Weight (kg) (kg): 58.8   Physical Exam:   General Patient is alert and oriented not any acute distress    HEENT: normal    Lungs: clear    Cardiac: regular rate, rhythm    Abdomen: soft  nontender  positive bowel sounds    Skin: intact    Extremities: No edema, rash or cyanosis    Neuro: AAOx3  cranial nerves intact    Psych: mood agitated    Physical Exam LYMPHATICS:   No cervical, axillary, or inguinal lymphadenopathy  Examination of right breast within normal limit.  Left breast some redness at the site of mastectomy and radiation therapy     Cancer, Left Breast:    Hypertension:    No Known Allergies:     letrozole 2.5 mg tablet: 1 tab(s) orally once a day x 30  days, Active, 30, 3   Acidophilus oral capsule: 1 cap(s) orally once a day, Active, 0, None   Diovan HCT 80 mg-12.5 mg oral tablet: 1 tab(s) orally once a day, Active, 0, None   Synthroid 50 mcg (0.05 mg) oral tablet: 1 tab(s) orally once a day, Active, 0, None   Elderberry 400 mg. Capsule: 1 cap(s) orally once a day, Active, 0, None   Bioten 5000 mcg.. oral tablets: 1 tab(s) orally once a day, Active, 0, None   Co Q-10 100 mg oral capsule: 1 cap(s) orally once a day, Active, 0, None   Magnesium  with Zinc 265/10 mg. oral tablets: 1 tab(s) orally once a day, Active, 0, None   Vitamin D3 5000 intl units  oral capsule: 1 cap(s) orally once a day, Active, 0, None   Calcium 500 mg. oral tablets. : 1 tab(s) orally 2 times a day, Active, 0, None   Multiple Vitamins: 1 tab(s) orally once a day, Active, 0, None   Omega Fish Oil 1200 mg. 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  Considering her age and considering the stage of disease certainly it is our optional approaches.  Patient is extremely apprehensive as well as desire to try anti-hormonal therapy.  Radio side effects have been discussed at length.  Patient absolutely does not want to take tamoxifen.  We can try letrozole 2.5 mg daily and if patient does not tolerate it we discontinued.  CC Referral:   cc: Dr. Brunetta Genera, Dr. Junita Push [facility]   Electronic Signatures: Jobe Gibbon (MD)  (Signed 16-Apr-13 16:53)  Authored: HISTORY OF PRESENT ILLNESS, PFSH, ROS, NURSING NOTES, PE, PAST MEDICAL HISTORY, ALLERGIES, HOME MEDICATIONS, LABS, ASSESSMENT AND PLAN, CC Referring Physician   Last Updated: 16-Apr-13 16:53 by Jobe Gibbon (MD)

## 2014-04-27 ENCOUNTER — Ambulatory Visit
Admit: 2014-04-27 | Disposition: A | Discharge status: Home / Self Care | Payer: Self-pay | Attending: Radiation Oncology | Admitting: Radiation Oncology

## 2014-06-07 ENCOUNTER — Ambulatory Visit: Payer: PPO

## 2014-06-14 ENCOUNTER — Ambulatory Visit (INDEPENDENT_AMBULATORY_CARE_PROVIDER_SITE_OTHER): Payer: PPO | Admitting: Podiatry

## 2014-06-14 DIAGNOSIS — M79676 Pain in unspecified toe(s): Secondary | ICD-10-CM

## 2014-06-14 DIAGNOSIS — B351 Tinea unguium: Secondary | ICD-10-CM

## 2014-06-14 NOTE — Progress Notes (Signed)
She presents today with a chief complaint of painful elongated toenails.  Objective: Vital signs are stable she is alert and oriented x3. Pulses are strongly palpable bilateral.  Assessment: Pain in limb secondary to onychomycosis 1 through 5 bilateral.  Plan: Debridement of nails in thickness and length as cover service secondary to pain.

## 2014-09-01 ENCOUNTER — Other Ambulatory Visit: Payer: Self-pay | Admitting: Neurology

## 2014-09-01 DIAGNOSIS — R569 Unspecified convulsions: Secondary | ICD-10-CM

## 2014-09-09 ENCOUNTER — Ambulatory Visit: Payer: PPO

## 2014-09-14 ENCOUNTER — Ambulatory Visit: Payer: PPO

## 2014-09-20 ENCOUNTER — Ambulatory Visit
Admission: RE | Admit: 2014-09-20 | Discharge: 2014-09-20 | Disposition: A | Discharge status: Home / Self Care | Payer: PPO | Source: Ambulatory Visit | Attending: Neurology | Admitting: Neurology

## 2014-09-20 DIAGNOSIS — R569 Unspecified convulsions: Secondary | ICD-10-CM | POA: Diagnosis present

## 2014-09-20 DIAGNOSIS — I739 Peripheral vascular disease, unspecified: Secondary | ICD-10-CM | POA: Insufficient documentation

## 2014-10-04 ENCOUNTER — Ambulatory Visit (INDEPENDENT_AMBULATORY_CARE_PROVIDER_SITE_OTHER): Payer: PPO | Admitting: Podiatry

## 2014-10-04 DIAGNOSIS — M79676 Pain in unspecified toe(s): Secondary | ICD-10-CM | POA: Diagnosis not present

## 2014-10-04 DIAGNOSIS — B351 Tinea unguium: Secondary | ICD-10-CM

## 2014-10-04 NOTE — Progress Notes (Signed)
She presents today with a chief complaint of painful elongated toenails.  Objective: Vital signs are stable she is alert and oriented x3. Pulses are strongly palpable bilateral.  Assessment: Pain in limb secondary to onychomycosis 1 through 5 bilateral.  Plan: Debridement of nails 1-5 B in thickness and length as cover service secondary to pain. 3 mo.  Roselind Messier DPM

## 2015-01-05 ENCOUNTER — Ambulatory Visit (INDEPENDENT_AMBULATORY_CARE_PROVIDER_SITE_OTHER): Payer: PPO | Admitting: Podiatry

## 2015-01-05 ENCOUNTER — Encounter: Payer: Self-pay | Admitting: Podiatry

## 2015-01-05 DIAGNOSIS — M79676 Pain in unspecified toe(s): Secondary | ICD-10-CM | POA: Diagnosis not present

## 2015-01-05 DIAGNOSIS — B351 Tinea unguium: Secondary | ICD-10-CM

## 2015-01-05 NOTE — Progress Notes (Signed)
She presents today with a chief complaint of painful elongated toenails.  Objective: Vital signs are stable alert and oriented 3 pulses are palpable. Toenails are thick yellow dystrophic onychomycotic and painful palpation.  Assessment: Pain limb secondary to onychomycosis.  Plan: Debridement of toenails 1 through 5 bilateral covered service secondary to pain follow up with her 3 months.

## 2015-01-12 DIAGNOSIS — G4733 Obstructive sleep apnea (adult) (pediatric): Secondary | ICD-10-CM | POA: Diagnosis not present

## 2015-01-13 DIAGNOSIS — J340 Abscess, furuncle and carbuncle of nose: Secondary | ICD-10-CM | POA: Diagnosis not present

## 2015-01-13 DIAGNOSIS — G4733 Obstructive sleep apnea (adult) (pediatric): Secondary | ICD-10-CM | POA: Diagnosis not present

## 2015-01-13 DIAGNOSIS — H6123 Impacted cerumen, bilateral: Secondary | ICD-10-CM | POA: Diagnosis not present

## 2015-01-18 DIAGNOSIS — G4733 Obstructive sleep apnea (adult) (pediatric): Secondary | ICD-10-CM | POA: Diagnosis not present

## 2015-01-27 DIAGNOSIS — G4733 Obstructive sleep apnea (adult) (pediatric): Secondary | ICD-10-CM | POA: Diagnosis not present

## 2015-01-31 DIAGNOSIS — J340 Abscess, furuncle and carbuncle of nose: Secondary | ICD-10-CM | POA: Diagnosis not present

## 2015-01-31 DIAGNOSIS — J330 Polyp of nasal cavity: Secondary | ICD-10-CM | POA: Diagnosis not present

## 2015-02-17 ENCOUNTER — Other Ambulatory Visit: Payer: Self-pay | Admitting: Family Medicine

## 2015-02-17 DIAGNOSIS — Z1231 Encounter for screening mammogram for malignant neoplasm of breast: Secondary | ICD-10-CM

## 2015-03-03 ENCOUNTER — Ambulatory Visit
Admission: RE | Admit: 2015-03-03 | Discharge: 2015-03-03 | Disposition: A | Discharge status: Home / Self Care | Payer: PPO | Source: Ambulatory Visit | Attending: Family Medicine | Admitting: Family Medicine

## 2015-03-03 DIAGNOSIS — R928 Other abnormal and inconclusive findings on diagnostic imaging of breast: Secondary | ICD-10-CM | POA: Diagnosis not present

## 2015-03-03 DIAGNOSIS — Z1231 Encounter for screening mammogram for malignant neoplasm of breast: Secondary | ICD-10-CM

## 2015-04-05 DIAGNOSIS — R5383 Other fatigue: Secondary | ICD-10-CM | POA: Diagnosis not present

## 2015-04-05 DIAGNOSIS — E784 Other hyperlipidemia: Secondary | ICD-10-CM | POA: Diagnosis not present

## 2015-04-05 DIAGNOSIS — E039 Hypothyroidism, unspecified: Secondary | ICD-10-CM | POA: Diagnosis not present

## 2015-04-05 DIAGNOSIS — M810 Age-related osteoporosis without current pathological fracture: Secondary | ICD-10-CM | POA: Diagnosis not present

## 2015-04-05 DIAGNOSIS — I10 Essential (primary) hypertension: Secondary | ICD-10-CM | POA: Diagnosis not present

## 2015-04-05 DIAGNOSIS — E7800 Pure hypercholesterolemia, unspecified: Secondary | ICD-10-CM | POA: Diagnosis not present

## 2015-04-05 DIAGNOSIS — E559 Vitamin D deficiency, unspecified: Secondary | ICD-10-CM | POA: Diagnosis not present

## 2015-04-06 ENCOUNTER — Ambulatory Visit: Payer: PPO | Admitting: Podiatry

## 2015-04-06 DIAGNOSIS — H26492 Other secondary cataract, left eye: Secondary | ICD-10-CM | POA: Diagnosis not present

## 2015-04-11 ENCOUNTER — Ambulatory Visit: Payer: PPO | Admitting: Podiatry

## 2015-04-13 DIAGNOSIS — H6122 Impacted cerumen, left ear: Secondary | ICD-10-CM | POA: Diagnosis not present

## 2015-04-13 DIAGNOSIS — J330 Polyp of nasal cavity: Secondary | ICD-10-CM | POA: Diagnosis not present

## 2015-04-14 DIAGNOSIS — G4733 Obstructive sleep apnea (adult) (pediatric): Secondary | ICD-10-CM | POA: Diagnosis not present

## 2015-04-18 ENCOUNTER — Ambulatory Visit (INDEPENDENT_AMBULATORY_CARE_PROVIDER_SITE_OTHER): Payer: PPO | Admitting: Podiatry

## 2015-04-18 ENCOUNTER — Encounter: Payer: Self-pay | Admitting: Podiatry

## 2015-04-18 DIAGNOSIS — M79676 Pain in unspecified toe(s): Secondary | ICD-10-CM | POA: Diagnosis not present

## 2015-04-18 DIAGNOSIS — B351 Tinea unguium: Secondary | ICD-10-CM

## 2015-04-18 NOTE — Progress Notes (Signed)
She presents today with chief complaint of painful elongated toenails 1 through 5 bilateral.  Objective: Vital signs are stable she is alert and oriented 3. Toenails are thick yellow dystrophic little mycotic. Pulses are strongly palpable.  Assessment: Pain limb secondary to onychomycosis.  Plan: Debrided toenails 1 through 5 bilateral.

## 2015-06-06 DIAGNOSIS — G4733 Obstructive sleep apnea (adult) (pediatric): Secondary | ICD-10-CM | POA: Diagnosis not present

## 2015-06-28 DIAGNOSIS — M810 Age-related osteoporosis without current pathological fracture: Secondary | ICD-10-CM | POA: Diagnosis not present

## 2015-06-28 DIAGNOSIS — E559 Vitamin D deficiency, unspecified: Secondary | ICD-10-CM | POA: Diagnosis not present

## 2015-07-12 DIAGNOSIS — I10 Essential (primary) hypertension: Secondary | ICD-10-CM | POA: Diagnosis not present

## 2015-07-12 DIAGNOSIS — E039 Hypothyroidism, unspecified: Secondary | ICD-10-CM | POA: Diagnosis not present

## 2015-07-12 DIAGNOSIS — E7800 Pure hypercholesterolemia, unspecified: Secondary | ICD-10-CM | POA: Diagnosis not present

## 2015-07-12 DIAGNOSIS — R5383 Other fatigue: Secondary | ICD-10-CM | POA: Diagnosis not present

## 2015-07-12 DIAGNOSIS — E559 Vitamin D deficiency, unspecified: Secondary | ICD-10-CM | POA: Diagnosis not present

## 2015-07-12 DIAGNOSIS — E784 Other hyperlipidemia: Secondary | ICD-10-CM | POA: Diagnosis not present

## 2015-07-13 DIAGNOSIS — L433 Subacute (active) lichen planus: Secondary | ICD-10-CM | POA: Diagnosis not present

## 2015-07-18 ENCOUNTER — Ambulatory Visit (INDEPENDENT_AMBULATORY_CARE_PROVIDER_SITE_OTHER): Payer: PPO | Admitting: Podiatry

## 2015-07-18 ENCOUNTER — Encounter: Payer: Self-pay | Admitting: Podiatry

## 2015-07-18 DIAGNOSIS — M79676 Pain in unspecified toe(s): Secondary | ICD-10-CM | POA: Diagnosis not present

## 2015-07-18 DIAGNOSIS — B351 Tinea unguium: Secondary | ICD-10-CM | POA: Diagnosis not present

## 2015-07-18 DIAGNOSIS — G4733 Obstructive sleep apnea (adult) (pediatric): Secondary | ICD-10-CM | POA: Diagnosis not present

## 2015-07-18 NOTE — Progress Notes (Signed)
She presents today with chief complaint of painful elongated toenails 1 through 5 bilateral.  Objective: Her toenails are thick yellow dystrophic onychomycotic toenails are thick yellow dystrophic with mycotic palpable pulses.  Assessment: Pain limb secondary to onychomycosis.  Plan: Debridement of toenails 1 through 5 bilateral.

## 2015-08-22 DIAGNOSIS — G4733 Obstructive sleep apnea (adult) (pediatric): Secondary | ICD-10-CM | POA: Diagnosis not present

## 2015-08-22 DIAGNOSIS — J330 Polyp of nasal cavity: Secondary | ICD-10-CM | POA: Diagnosis not present

## 2015-08-22 DIAGNOSIS — H6123 Impacted cerumen, bilateral: Secondary | ICD-10-CM | POA: Diagnosis not present

## 2015-08-24 DIAGNOSIS — G4733 Obstructive sleep apnea (adult) (pediatric): Secondary | ICD-10-CM | POA: Diagnosis not present

## 2015-09-26 DIAGNOSIS — N760 Acute vaginitis: Secondary | ICD-10-CM | POA: Diagnosis not present

## 2015-09-26 DIAGNOSIS — G4733 Obstructive sleep apnea (adult) (pediatric): Secondary | ICD-10-CM | POA: Diagnosis not present

## 2015-10-17 ENCOUNTER — Encounter: Payer: Self-pay | Admitting: Podiatry

## 2015-10-17 ENCOUNTER — Ambulatory Visit (INDEPENDENT_AMBULATORY_CARE_PROVIDER_SITE_OTHER): Payer: PPO | Admitting: Podiatry

## 2015-10-17 DIAGNOSIS — B351 Tinea unguium: Secondary | ICD-10-CM

## 2015-10-17 DIAGNOSIS — M79676 Pain in unspecified toe(s): Secondary | ICD-10-CM

## 2015-10-17 NOTE — Progress Notes (Signed)
She presents today with chief complaint of painful elongated toenails 1 through 5 bilateral.  Objective: Vital signs are stable alert and oriented 3. Pulses are palpable. Toenails are thick yellow dystrophic with mycotic and painful palpation.  Assessment: Pain and limb secondary to onychomycosis.  Plan: Debridement of toenails 1 through 5 bilateral.

## 2015-10-24 DIAGNOSIS — G4733 Obstructive sleep apnea (adult) (pediatric): Secondary | ICD-10-CM | POA: Diagnosis not present

## 2015-11-23 DIAGNOSIS — R5383 Other fatigue: Secondary | ICD-10-CM | POA: Diagnosis not present

## 2015-11-23 DIAGNOSIS — J069 Acute upper respiratory infection, unspecified: Secondary | ICD-10-CM | POA: Diagnosis not present

## 2015-11-23 DIAGNOSIS — R05 Cough: Secondary | ICD-10-CM | POA: Diagnosis not present

## 2015-11-28 DIAGNOSIS — L821 Other seborrheic keratosis: Secondary | ICD-10-CM | POA: Diagnosis not present

## 2015-11-28 DIAGNOSIS — L309 Dermatitis, unspecified: Secondary | ICD-10-CM | POA: Diagnosis not present

## 2015-11-30 DIAGNOSIS — J330 Polyp of nasal cavity: Secondary | ICD-10-CM | POA: Diagnosis not present

## 2015-11-30 DIAGNOSIS — J309 Allergic rhinitis, unspecified: Secondary | ICD-10-CM | POA: Diagnosis not present

## 2015-12-08 DIAGNOSIS — H6121 Impacted cerumen, right ear: Secondary | ICD-10-CM | POA: Diagnosis not present

## 2015-12-08 DIAGNOSIS — J330 Polyp of nasal cavity: Secondary | ICD-10-CM | POA: Diagnosis not present

## 2016-01-11 DIAGNOSIS — E559 Vitamin D deficiency, unspecified: Secondary | ICD-10-CM | POA: Diagnosis not present

## 2016-01-11 DIAGNOSIS — E784 Other hyperlipidemia: Secondary | ICD-10-CM | POA: Diagnosis not present

## 2016-01-11 DIAGNOSIS — R5383 Other fatigue: Secondary | ICD-10-CM | POA: Diagnosis not present

## 2016-01-11 DIAGNOSIS — E039 Hypothyroidism, unspecified: Secondary | ICD-10-CM | POA: Diagnosis not present

## 2016-01-11 DIAGNOSIS — I10 Essential (primary) hypertension: Secondary | ICD-10-CM | POA: Diagnosis not present

## 2016-01-11 DIAGNOSIS — E7800 Pure hypercholesterolemia, unspecified: Secondary | ICD-10-CM | POA: Diagnosis not present

## 2016-01-16 ENCOUNTER — Ambulatory Visit (INDEPENDENT_AMBULATORY_CARE_PROVIDER_SITE_OTHER): Payer: PPO

## 2016-01-16 ENCOUNTER — Ambulatory Visit (INDEPENDENT_AMBULATORY_CARE_PROVIDER_SITE_OTHER): Payer: PPO | Admitting: Podiatry

## 2016-01-16 ENCOUNTER — Encounter: Payer: Self-pay | Admitting: Podiatry

## 2016-01-16 DIAGNOSIS — M79674 Pain in right toe(s): Secondary | ICD-10-CM

## 2016-01-16 DIAGNOSIS — B351 Tinea unguium: Secondary | ICD-10-CM | POA: Diagnosis not present

## 2016-01-16 DIAGNOSIS — M79676 Pain in unspecified toe(s): Secondary | ICD-10-CM

## 2016-01-16 NOTE — Progress Notes (Signed)
She presents today with chief complaint of painful toenails times past 3 weeks. She states that if his like him walking on a marble for a bone on the bottom my big toe. We will make toenails presents to my shoes.  Objective: Vital signs are stable alert and oriented 3. Pulses are palpable. Neurologic sensorium is intact. Deep tendon reflexes are intact. Thick yellow dystrophic onychomycotic nails painful on palpation. No calluses no open wounds or lesions plantar aspect of the foot. Most tender areas the tip of the toe hallux right. As for the likely associated with the thickness of the hallux nail plate right.  Assessment: Pain in limb signature onychomycosis.  Plan: Debridement of all reactive keratotic lesions and debridement of toenails.

## 2016-02-01 DIAGNOSIS — G4733 Obstructive sleep apnea (adult) (pediatric): Secondary | ICD-10-CM | POA: Diagnosis not present

## 2016-02-08 ENCOUNTER — Encounter: Payer: Self-pay | Admitting: Podiatry

## 2016-02-08 ENCOUNTER — Ambulatory Visit (INDEPENDENT_AMBULATORY_CARE_PROVIDER_SITE_OTHER): Payer: PPO | Admitting: Podiatry

## 2016-02-08 DIAGNOSIS — Q828 Other specified congenital malformations of skin: Secondary | ICD-10-CM | POA: Diagnosis not present

## 2016-02-08 NOTE — Progress Notes (Signed)
She presents today with chief complaint of a painful lesion to the lateral aspect of the hallux right.  Objective: Vital signs are stable she is alert and oriented 3 has severe osteoarthritis of the right foot resulting in the juxtaposition of the second PIPJ against the lateral aspect of the hallux. There is no infection present. Hyperkeratosis is present. Pulses remain palpable.  Assessment: Porokeratosis lateral aspect hallux right.  Plan: Debrided reactive hyperkeratotic lesion placed padding follow up with me as needed.

## 2016-02-17 DIAGNOSIS — W19XXXA Unspecified fall, initial encounter: Secondary | ICD-10-CM | POA: Diagnosis not present

## 2016-02-20 DIAGNOSIS — G4733 Obstructive sleep apnea (adult) (pediatric): Secondary | ICD-10-CM | POA: Diagnosis not present

## 2016-02-23 DIAGNOSIS — J019 Acute sinusitis, unspecified: Secondary | ICD-10-CM | POA: Diagnosis not present

## 2016-02-25 ENCOUNTER — Emergency Department (HOSPITAL_COMMUNITY): Payer: PPO

## 2016-02-25 ENCOUNTER — Encounter (HOSPITAL_COMMUNITY): Payer: Self-pay | Admitting: Vascular Surgery

## 2016-02-25 ENCOUNTER — Emergency Department (HOSPITAL_COMMUNITY)
Admission: EM | Admit: 2016-02-25 | Discharge: 2016-02-25 | Disposition: A | Discharge status: Home / Self Care | Payer: PPO | Attending: Emergency Medicine | Admitting: Emergency Medicine

## 2016-02-25 DIAGNOSIS — R9431 Abnormal electrocardiogram [ECG] [EKG]: Secondary | ICD-10-CM | POA: Diagnosis not present

## 2016-02-25 DIAGNOSIS — E039 Hypothyroidism, unspecified: Secondary | ICD-10-CM | POA: Diagnosis not present

## 2016-02-25 DIAGNOSIS — R410 Disorientation, unspecified: Secondary | ICD-10-CM | POA: Insufficient documentation

## 2016-02-25 DIAGNOSIS — E871 Hypo-osmolality and hyponatremia: Secondary | ICD-10-CM | POA: Diagnosis not present

## 2016-02-25 DIAGNOSIS — Z96653 Presence of artificial knee joint, bilateral: Secondary | ICD-10-CM | POA: Diagnosis not present

## 2016-02-25 DIAGNOSIS — Z853 Personal history of malignant neoplasm of breast: Secondary | ICD-10-CM | POA: Diagnosis not present

## 2016-02-25 DIAGNOSIS — R4182 Altered mental status, unspecified: Secondary | ICD-10-CM | POA: Diagnosis not present

## 2016-02-25 DIAGNOSIS — I10 Essential (primary) hypertension: Secondary | ICD-10-CM | POA: Insufficient documentation

## 2016-02-25 DIAGNOSIS — S0990XA Unspecified injury of head, initial encounter: Secondary | ICD-10-CM | POA: Diagnosis not present

## 2016-02-25 DIAGNOSIS — J90 Pleural effusion, not elsewhere classified: Secondary | ICD-10-CM | POA: Diagnosis not present

## 2016-02-25 LAB — URINALYSIS, ROUTINE W REFLEX MICROSCOPIC
BACTERIA UA: NONE SEEN
BILIRUBIN URINE: NEGATIVE
Glucose, UA: NEGATIVE mg/dL
KETONES UR: 5 mg/dL — AB
LEUKOCYTES UA: NEGATIVE
Nitrite: NEGATIVE
PROTEIN: NEGATIVE mg/dL
SPECIFIC GRAVITY, URINE: 1.009 (ref 1.005–1.030)
pH: 6 (ref 5.0–8.0)

## 2016-02-25 LAB — CBC WITH DIFFERENTIAL/PLATELET
BASOS ABS: 0 10*3/uL (ref 0.0–0.1)
Basophils Relative: 0 %
EOS ABS: 0 10*3/uL (ref 0.0–0.7)
EOS PCT: 0 %
HCT: 37 % (ref 36.0–46.0)
Hemoglobin: 12.8 g/dL (ref 12.0–15.0)
LYMPHS ABS: 1.2 10*3/uL (ref 0.7–4.0)
Lymphocytes Relative: 23 %
MCH: 28.6 pg (ref 26.0–34.0)
MCHC: 34.6 g/dL (ref 30.0–36.0)
MCV: 82.6 fL (ref 78.0–100.0)
Monocytes Absolute: 0.7 10*3/uL (ref 0.1–1.0)
Monocytes Relative: 13 %
Neutro Abs: 3.3 10*3/uL (ref 1.7–7.7)
Neutrophils Relative %: 64 %
PLATELETS: 135 10*3/uL — AB (ref 150–400)
RBC: 4.48 MIL/uL (ref 3.87–5.11)
RDW: 14.5 % (ref 11.5–15.5)
WBC: 5.2 10*3/uL (ref 4.0–10.5)

## 2016-02-25 LAB — COMPREHENSIVE METABOLIC PANEL
ALT: 17 U/L (ref 14–54)
AST: 28 U/L (ref 15–41)
Albumin: 3.8 g/dL (ref 3.5–5.0)
Alkaline Phosphatase: 55 U/L (ref 38–126)
Anion gap: 11 (ref 5–15)
BUN: 11 mg/dL (ref 6–20)
CHLORIDE: 92 mmol/L — AB (ref 101–111)
CO2: 22 mmol/L (ref 22–32)
CREATININE: 0.77 mg/dL (ref 0.44–1.00)
Calcium: 9 mg/dL (ref 8.9–10.3)
GFR calc Af Amer: >60 mL/min (ref >60)
GFR calc non Af Amer: >60 mL/min (ref >60)
Glucose, Bld: 94 mg/dL (ref 65–99)
Potassium: 3.6 mmol/L (ref 3.5–5.1)
SODIUM: 125 mmol/L — AB (ref 135–145)
Total Bilirubin: 0.6 mg/dL (ref 0.3–1.2)
Total Protein: 6.4 g/dL — ABNORMAL LOW (ref 6.5–8.1)

## 2016-02-25 NOTE — ED Provider Notes (Signed)
[facility] DEPT Provider Note   CSN: [id number] Arrival date & time: 02/25/16  1128     History   Chief Complaint Chief Complaint  Patient presents with  . Altered Mental Status    HPI Lori Wiggins is a 83 y.o. female.  Patient with history of sleep apnea, hypertension -- brought in by daughter with complaint of confusion today. Patient had a fall one week ago where she struck her head. No loss of consciousness before or after the fall. Patient was evaluated by EMS and she refused transport at that time. She did well in the subsequent days. For the past 3-4 days he has had chest congestion, cough, nasal congestion or runny nose. She was seen by her primary care physician and started on amoxicillin and Tessalon Perles. Patient has been intermittently mildly confused over the past several days. She was especially confused last night when her CPAP machine was not working. No other complaints. No fevers, vomiting, or diarrhea. No urinary symptoms. No chest pain. The onset of this condition was acute. The course is constant. Aggravating factors: none. Alleviating factors: none.        Past Medical History:  Diagnosis Date  . Arthritis   . Breast cancer ([place])    left  breast  . GERD (gastroesophageal reflux disease)   . Hypertension   . Hypothyroidism   . Sleep apnea    uses CPAP    Patient Active Problem List   Diagnosis Date Noted  . Unspecified constipation 08/04/2012  . Unspecified hypothyroidism 07/31/2012  . Obstructive sleep apnea 07/31/2012  . E. coli UTI (urinary tract infection), fluoroquinolone resistant 07/26/2012  . Essential hypertension, benign 07/26/2012  . Ileus, postoperative ([place]) 07/24/2012  . Hyponatremia 07/24/2012  . Arthritis of knee, left 07/21/2012    Past Surgical History:  Procedure Laterality Date  . ABDOMINAL HYSTERECTOMY    . APPENDECTOMY    . BREAST BIOPSY Left 2012   Positive  . BREAST BIOPSY Right    at age 36's  . BREAST  LUMPECTOMY WITH AXILLARY LYMPH NODE BIOPSY Left 2012   with Radiation  . BREAST SURGERY Left 2012   Lumpectomy  . CARDIAC CATHETERIZATION     10 years ago  . TONSILLECTOMY    . TOTAL KNEE ARTHROPLASTY Right 07/21/2012   Dr Mayer Camel  . TOTAL KNEE ARTHROPLASTY Left 07/21/2012   Procedure: TOTAL KNEE ARTHROPLASTY;  Surgeon: Kerin Salen, MD;  Location: [facility];  Service: Orthopedics;  Laterality: Left;    OB History    No data available       Home Medications    Prior to Admission medications   Medication Sig Start Date End Date Taking? Authorizing Provider  amoxicillin-clavulanate (AUGMENTIN) 875-125 MG tablet Take 1 tablet by mouth 2 (two) times daily. 02/23/16 03/01/16 Yes Historical Provider, MD  benzonatate (TESSALON) 200 MG capsule Take 200 mg by mouth 3 (three) times daily as needed for cough.  02/23/16 03/01/16 Yes Historical Provider, MD  CALCIUM PO Take 1 tablet by mouth daily.   Yes Historical Provider, MD  Cholecalciferol (VITAMIN D3) 5000 UNITS TABS Take 5,000 Units by mouth daily.   Yes Historical Provider, MD  Coenzyme Q10 (CO Q-10) 100 MG CAPS Take 100 mg by mouth daily.   Yes Historical Provider, MD  gentamicin ointment (GARAMYCIN) 0.1 % Apply 1 application topically See admin instructions. Apply to bottom of nose for CPAP mask 01/09/16  Yes Historical Provider, MD  levothyroxine (SYNTHROID, LEVOTHROID) 50 MCG tablet Take  50 mcg by mouth daily before breakfast.   Yes Historical Provider, MD  Multiple Vitamin (MULTIVITAMIN WITH MINERALS) TABS Take 1 tablet by mouth daily.   Yes Historical Provider, MD  Omega-3 Fatty Acids (FISH OIL PO) Take 1 capsule by mouth 2 (two) times daily.   Yes Historical Provider, MD  Probiotic Product (PROBIOTIC PO) Take 1 capsule by mouth 2 (two) times daily.   Yes Historical Provider, MD  valsartan-hydrochlorothiazide (DIOVAN-HCT) 160-25 MG per tablet Take 1 tablet by mouth daily.   Yes Historical Provider, MD  meloxicam (MOBIC) 15 MG tablet Take 1 tablet  (15 mg total) by mouth daily. Patient not taking: Reported on 02/25/2016 02/22/14   Max Villa Herb, DPM    Family History Family History  Problem Relation Age of Onset  . Breast cancer Neg Hx     Social History Social History  Substance Use Topics  . Smoking status: Never Smoker  . Smokeless tobacco: Never Used  . Alcohol use Yes     Comment: social     Allergies   Sulfa antibiotics and Nsaids   Review of Systems Review of Systems  Constitutional: Negative for fever.  HENT: Positive for congestion and rhinorrhea. Negative for sore throat.   Eyes: Negative for redness.  Respiratory: Positive for cough and chest tightness. Negative for shortness of breath.   Cardiovascular: Negative for chest pain.  Gastrointestinal: Negative for abdominal pain, diarrhea, nausea and vomiting.  Genitourinary: Negative for dysuria.  Musculoskeletal: Negative for myalgias.  Skin: Negative for rash.  Neurological: Negative for headaches.  Psychiatric/Behavioral: Positive for confusion.     Physical Exam Updated Vital Signs BP (!) 111/53   Pulse 67   Temp 98.5 F (36.9 C) (Oral)   Resp 16   SpO2 100%   Physical Exam  Constitutional: She is oriented to person, place, and time. She appears well-developed and well-nourished.  HENT:  Head: Normocephalic and atraumatic.  Mouth/Throat: Oropharynx is clear and moist.  Eyes: Conjunctivae are normal. Right eye exhibits no discharge. Left eye exhibits no discharge.  Neck: Normal range of motion. Neck supple.  Cardiovascular: Normal rate, regular rhythm and normal heart sounds.   No murmur heard. Pulmonary/Chest: Effort normal and breath sounds normal. No respiratory distress. She has no wheezes. She has no rales.  Abdominal: Soft. There is no tenderness.  Neurological: She is alert and oriented to person, place, and time. No cranial nerve deficit or sensory deficit. She exhibits normal muscle tone. Coordination normal.  Skin: Skin is warm and  dry.  Psychiatric: She has a normal mood and affect.  Nursing note and vitals reviewed.    ED Treatments / Results  Labs (all labs ordered are listed, but only abnormal results are displayed) Labs Reviewed  CBC WITH DIFFERENTIAL/PLATELET - Abnormal; Notable for the following:       Result Value   Platelets 135 (*)    All other components within normal limits  COMPREHENSIVE METABOLIC PANEL - Abnormal; Notable for the following:    Sodium 125 (*)    Chloride 92 (*)    Total Protein 6.4 (*)    All other components within normal limits  URINALYSIS, ROUTINE W REFLEX MICROSCOPIC - Abnormal; Notable for the following:    Hgb urine dipstick SMALL (*)    Ketones, ur 5 (*)    Squamous Epithelial / LPF 0-5 (*)    All other components within normal limits    EKG  EKG Interpretation None  Radiology Dg Chest 2 View  Result Date: 02/25/2016 CLINICAL DATA:  Confusion and altered mental status with recent fall. EXAM: CHEST  2 VIEW COMPARISON:  07/16/2012 chest radiograph FINDINGS: Cardiomegaly and mild interstitial prominence again noted. A trace right pleural effusion is noted. There is no evidence of airspace disease, pneumothorax or pulmonary mass. No acute bony abnormalities are identified. Surgical clips overlying the left breast and axilla are again noted. IMPRESSION: Trace right pleural effusion without other acute abnormality. Cardiomegaly and mild chronic interstitial prominence. Electronically Signed   By: Margarette Canada M.D.   On: 02/25/2016 13:55   Ct Head Wo Contrast  Result Date: 02/25/2016 CLINICAL DATA:  Fall 1 week ago hitting head on floor.  Confusion. EXAM: CT HEAD WITHOUT CONTRAST TECHNIQUE: Contiguous axial images were obtained from the base of the skull through the vertex without intravenous contrast. COMPARISON:  MRI brain 09/20/2014 FINDINGS: Brain: Ventricles, cisterns and other CSF spaces are within normal. There is no mass, mass effect, shift of midline structures  or acute hemorrhage. No evidence of acute infarction. There is mild chronic ischemic microvascular disease. Vascular: Minimal calcified plaque over the cavernous segment of the internal carotid arteries as well as the left vertebral artery. Skull: Within normal. Sinuses/Orbits: Orbits are within normal. There is opacification over the ethmoid sinus with subtle air-fluid level over the left maxillary sinus. Mild opacification of the mastoid air cells. Other: None. IMPRESSION: No acute intracranial findings. Chronic ischemic microvascular disease. The chronic sinus inflammatory disease as cannot exclude an acute component involving the left maxillary sinus. Electronically Signed   By: Marin Olp M.D.   On: 02/25/2016 14:57    Procedures Procedures (including critical care time)  Medications Ordered in ED Medications - No data to display   Initial Impression / Assessment and Plan / ED Course  I have reviewed the triage vital signs and the nursing notes.  Pertinent labs & imaging results that were available during my care of the patient were reviewed by me and considered in my medical decision making (see chart for details).     Patient seen and examined. Work-up initiated. Medications ordered. Discussed with Dr. Sherry Ruffing who will see.   Vital signs reviewed and are as follows: BP (!) 113/54   Pulse 62   Temp 98.5 F (36.9 C) (Oral)   Resp 16   SpO2 99%   3:30 PM Patient seen by Dr. Sherry Ruffing. Work-up significant for worsening of chronic hyponatremia.   Feel patient is safe for discharge to home. They will follow up with her primary care physician next week. They will return with worsening symptoms or other concerns. Plan discussed with patient and daughter at bedside. They are both in agreement and would like to go home.  Final Clinical Impressions(s) / ED Diagnoses   Final diagnoses:  Hyponatremia  Confusion   Confusion: Likely related to being the middle of the night and CPAP  malfunction. No stroke signs or symptoms. Patient currently at her baseline. Remainder of workup is unrevealing for acute etiology.  Hyponatremia: Appears to be an exacerbation of her chronic hyponatremia. Likely unrelated to current episodes.  Patient with transient episode of confusion, worse last night. CT   New Prescriptions Current Discharge Medication List       Carlisle Cater, [place] 02/25/16 [place], MD 02/27/16 1140

## 2016-02-25 NOTE — ED Notes (Signed)
Pt transported to radiology.

## 2016-02-25 NOTE — ED Triage Notes (Signed)
Pt reports to the ED for eval of AMS following a fall on 02/17. She states that she fell and hit her head on a marble floor. Denies any LOC. Family reports the EMTs came and checked her out and cleared her so she was not seen and she did not seem to have any problems however, per family the patient had several episodes of altered mental status and confusion. She has recently developed a cough, chest congestion, and nasal congestion x 3-4 days. She was seen on 02/22 and was given Tessalon pearles and Amoxicillin and she has been compliant with the medications. Denies any fevers, chills, vision, HAs, or N/V. Per patient she believes the confusion was d/t her CPAP machine malfunctioning. She is currently A&Ox4, resp e/u, and skin warm and dry.

## 2016-02-25 NOTE — Discharge Instructions (Signed)
Please read and follow all provided instructions.  Your diagnoses today include:  1. Hyponatremia   2. Confusion     Tests performed today include:  Blood counts and electrolytes - sodium was 125 and chloride was 92  Urine test - no infection  Head CT - no stroke  Chest x-ray - no pneumonia  Vital signs. See below for your results today.   Medications prescribed:   None  Take any prescribed medications only as directed.  Home care instructions:  Follow any educational materials contained in this packet.  BE VERY CAREFUL not to take multiple medicines containing Tylenol (also called acetaminophen). Doing so can lead to an overdose which can damage your liver and cause liver failure and possibly death.   Follow-up instructions: Please follow-up with your primary care provider in the next 3 days for further evaluation of your symptoms.   Return instructions:   Please return to the Emergency Department if you experience worsening symptoms.   Return if you have weakness in your arms or legs, slurred speech, trouble walking or talking, confusion, or trouble with your balance.   Please return if you have any other emergent concerns.  Additional Information:  Your vital signs today were: BP 113/64    Pulse (!) 57    Temp 98.5 F (36.9 C) (Oral)    Resp 15    SpO2 98%  If your blood pressure (BP) was elevated above 135/85 this visit, please have this repeated by your doctor within one month. --------------

## 2016-03-09 ENCOUNTER — Other Ambulatory Visit: Payer: Self-pay | Admitting: Family Medicine

## 2016-03-09 DIAGNOSIS — Z853 Personal history of malignant neoplasm of breast: Secondary | ICD-10-CM

## 2016-03-12 DIAGNOSIS — J330 Polyp of nasal cavity: Secondary | ICD-10-CM | POA: Diagnosis not present

## 2016-03-12 DIAGNOSIS — R42 Dizziness and giddiness: Secondary | ICD-10-CM | POA: Diagnosis not present

## 2016-03-19 DIAGNOSIS — R42 Dizziness and giddiness: Secondary | ICD-10-CM | POA: Diagnosis not present

## 2016-03-26 DIAGNOSIS — E7800 Pure hypercholesterolemia, unspecified: Secondary | ICD-10-CM | POA: Diagnosis not present

## 2016-03-26 DIAGNOSIS — E569 Vitamin deficiency, unspecified: Secondary | ICD-10-CM | POA: Diagnosis not present

## 2016-03-26 DIAGNOSIS — I10 Essential (primary) hypertension: Secondary | ICD-10-CM | POA: Diagnosis not present

## 2016-04-06 ENCOUNTER — Ambulatory Visit
Admission: RE | Admit: 2016-04-06 | Discharge: 2016-04-06 | Disposition: A | Discharge status: Home / Self Care | Payer: PPO | Source: Ambulatory Visit | Attending: Family Medicine | Admitting: Family Medicine

## 2016-04-06 DIAGNOSIS — Z853 Personal history of malignant neoplasm of breast: Secondary | ICD-10-CM

## 2016-04-06 DIAGNOSIS — R922 Inconclusive mammogram: Secondary | ICD-10-CM | POA: Diagnosis not present

## 2016-04-06 DIAGNOSIS — Z9889 Other specified postprocedural states: Secondary | ICD-10-CM | POA: Diagnosis not present

## 2016-04-06 HISTORY — DX: Personal history of irradiation: Z92.3

## 2016-04-16 ENCOUNTER — Encounter: Payer: Self-pay | Admitting: Podiatry

## 2016-04-16 ENCOUNTER — Ambulatory Visit (INDEPENDENT_AMBULATORY_CARE_PROVIDER_SITE_OTHER): Payer: PPO | Admitting: Podiatry

## 2016-04-16 ENCOUNTER — Ambulatory Visit: Payer: PPO | Admitting: Podiatry

## 2016-04-16 DIAGNOSIS — B351 Tinea unguium: Secondary | ICD-10-CM | POA: Diagnosis not present

## 2016-04-16 DIAGNOSIS — M79676 Pain in unspecified toe(s): Secondary | ICD-10-CM

## 2016-04-16 NOTE — Progress Notes (Signed)
Complaint:  Visit Type: Patient returns to my office for continued preventative foot care services. Complaint: Patient states" my nails have grown long and thick and become painful to walk and wear shoes"  The patient presents for preventative foot care services. No changes to ROS  Podiatric Exam: Vascular: dorsalis pedis and posterior tibial pulses are palpable bilateral. Capillary return is immediate. Temperature gradient is WNL. Skin turgor WNL  Sensorium: Normal Semmes Weinstein monofilament test. Normal tactile sensation bilaterally. Nail Exam: Pt has thick disfigured discolored nails with subungual debris noted bilateral entire nail hallux through fifth toenails.  Her second and fourth toes are red and inflamed around proximal nail fold. Ulcer Exam: There is no evidence of ulcer or pre-ulcerative changes or infection. Orthopedic Exam: Muscle tone and strength are WNL. No limitations in general ROM. No crepitus or effusions noted. Foot type and digits show no abnormalities. Bony prominences are unremarkable. Skin: No Porokeratosis. No infection or ulcers  Diagnosis:  Onychomycosis, , Pain in right toe, pain in left toes  Treatment & Plan Procedures and Treatment: Consent by patient was obtained for treatment procedures. The patient understood the discussion of treatment and procedures well. All questions were answered thoroughly reviewed. Debridement of mycotic and hypertrophic toenails, 1 through 5 bilateral and clearing of subungual debris. No ulceration, no infection noted.  Return Visit-Office Procedure: Patient instructed to return to the office for a follow up visit 10 weeks for continued evaluation and treatment.    Gardiner Barefoot DPM

## 2016-04-18 DIAGNOSIS — R42 Dizziness and giddiness: Secondary | ICD-10-CM | POA: Diagnosis not present

## 2016-04-21 ENCOUNTER — Encounter: Payer: Self-pay | Admitting: Emergency Medicine

## 2016-04-21 ENCOUNTER — Emergency Department
Admission: EM | Admit: 2016-04-21 | Discharge: 2016-04-21 | Disposition: A | Discharge status: Home / Self Care | Payer: PPO | Attending: Emergency Medicine | Admitting: Emergency Medicine

## 2016-04-21 DIAGNOSIS — I10 Essential (primary) hypertension: Secondary | ICD-10-CM | POA: Diagnosis not present

## 2016-04-21 DIAGNOSIS — Z5321 Procedure and treatment not carried out due to patient leaving prior to being seen by health care provider: Secondary | ICD-10-CM | POA: Insufficient documentation

## 2016-04-21 DIAGNOSIS — E039 Hypothyroidism, unspecified: Secondary | ICD-10-CM | POA: Diagnosis not present

## 2016-04-21 LAB — URINALYSIS, COMPLETE (UACMP) WITH MICROSCOPIC
BILIRUBIN URINE: NEGATIVE
Glucose, UA: NEGATIVE mg/dL
KETONES UR: NEGATIVE mg/dL
NITRITE: NEGATIVE
PROTEIN: NEGATIVE mg/dL
Specific Gravity, Urine: 1.004 — ABNORMAL LOW (ref 1.005–1.030)
pH: 6 (ref 5.0–8.0)

## 2016-04-21 NOTE — ED Notes (Signed)
Pt to desk stating she is feeling better and wanting to leave.  Triage RN rechecking pt's BP at this time

## 2016-04-21 NOTE — ED Notes (Signed)
BP rechecked per request. 161/61.

## 2016-04-21 NOTE — ED Triage Notes (Signed)
Pt states that she felt a little bit "shaky" this AM and took her BP which was 183/100. Pt has hx of HTN and recently has had her medicine changed. Pt is ambulatory to triage with NAD.

## 2016-04-21 NOTE — ED Notes (Signed)
Pt decided to leave and states that she is feeling better. This RN informed pt that it was her decision, but we would love to have you seen by a MD. Pt decided to leave at this time.

## 2016-04-23 DIAGNOSIS — H26492 Other secondary cataract, left eye: Secondary | ICD-10-CM | POA: Diagnosis not present

## 2016-05-25 DIAGNOSIS — G4733 Obstructive sleep apnea (adult) (pediatric): Secondary | ICD-10-CM | POA: Diagnosis not present

## 2016-06-13 DIAGNOSIS — J330 Polyp of nasal cavity: Secondary | ICD-10-CM | POA: Diagnosis not present

## 2016-06-13 DIAGNOSIS — G4733 Obstructive sleep apnea (adult) (pediatric): Secondary | ICD-10-CM | POA: Diagnosis not present

## 2016-06-13 DIAGNOSIS — H6121 Impacted cerumen, right ear: Secondary | ICD-10-CM | POA: Diagnosis not present

## 2016-06-18 ENCOUNTER — Ambulatory Visit (INDEPENDENT_AMBULATORY_CARE_PROVIDER_SITE_OTHER): Payer: PPO | Admitting: Podiatry

## 2016-06-18 ENCOUNTER — Encounter: Payer: Self-pay | Admitting: Podiatry

## 2016-06-18 DIAGNOSIS — L03032 Cellulitis of left toe: Secondary | ICD-10-CM | POA: Diagnosis not present

## 2016-06-18 NOTE — Progress Notes (Signed)
She presents today concerned about her second and fourth toes of the left foot. She states that I discussed noticing the toe gets red around these nails and the get sore. She states that I disfigured out the nose where 2 small issue on this left foot. Maybe just the problem.  Objective: Vital signs are stable alert and oriented 3. Pulses are palpable. Mallet toe present at the second and fourth digits of the left foot mild erythema at the nails are dystrophic and clinically mycotic. I see no signs of cellulitis.  Assessment: Mild paronychia second and fourth toes of the left foot.  Plan: Start soaking in Epsom salts and warm water covered in the daytime leave open at bedtime. Follow-up with me as needed.

## 2016-06-25 DIAGNOSIS — I10 Essential (primary) hypertension: Secondary | ICD-10-CM | POA: Diagnosis not present

## 2016-06-25 DIAGNOSIS — E559 Vitamin D deficiency, unspecified: Secondary | ICD-10-CM | POA: Diagnosis not present

## 2016-06-25 DIAGNOSIS — R5383 Other fatigue: Secondary | ICD-10-CM | POA: Diagnosis not present

## 2016-06-25 DIAGNOSIS — E7800 Pure hypercholesterolemia, unspecified: Secondary | ICD-10-CM | POA: Diagnosis not present

## 2016-06-28 ENCOUNTER — Ambulatory Visit: Payer: PPO | Admitting: Podiatry

## 2016-07-09 DIAGNOSIS — E063 Autoimmune thyroiditis: Secondary | ICD-10-CM | POA: Diagnosis not present

## 2016-07-23 DIAGNOSIS — R5381 Other malaise: Secondary | ICD-10-CM | POA: Diagnosis not present

## 2016-07-31 DIAGNOSIS — G4733 Obstructive sleep apnea (adult) (pediatric): Secondary | ICD-10-CM | POA: Diagnosis not present

## 2016-08-06 ENCOUNTER — Ambulatory Visit: Payer: PPO | Admitting: Podiatry

## 2016-08-13 ENCOUNTER — Ambulatory Visit (INDEPENDENT_AMBULATORY_CARE_PROVIDER_SITE_OTHER): Payer: PPO | Admitting: Podiatry

## 2016-08-13 DIAGNOSIS — M79676 Pain in unspecified toe(s): Secondary | ICD-10-CM

## 2016-08-13 DIAGNOSIS — B351 Tinea unguium: Secondary | ICD-10-CM | POA: Diagnosis not present

## 2016-08-13 NOTE — Progress Notes (Signed)
Complaint:  Visit Type: Patient returns to my office for continued preventative foot care services. Complaint: Patient states" my nails have grown long and thick and become painful to walk and wear shoes"  The patient presents for preventative foot care services. No changes to ROS.  She has persistent redness at 2,4 toes left foot.    Podiatric Exam: Vascular: dorsalis pedis and posterior tibial pulses are palpable bilateral. Capillary return is immediate. Temperature gradient is WNL. Skin turgor WNL  Sensorium: Normal Semmes Weinstein monofilament test. Normal tactile sensation bilaterally. Nail Exam: Pt has thick disfigured discolored nails with subungual debris noted bilateral entire nail hallux through fifth toenails.  Her second and fourth toes are red and inflamed around proximal nail fold. Ulcer Exam: There is no evidence of ulcer or pre-ulcerative changes or infection. Orthopedic Exam: Muscle tone and strength are WNL. No limitations in general ROM. No crepitus or effusions noted. Mallet toes 2-4 left.   Skin: No Porokeratosis. No infection or ulcers  Diagnosis:  Onychomycosis, , Pain in right toe, pain in left toes  Treatment & Plan Procedures and Treatment: Consent by patient was obtained for treatment procedures. The patient understood the discussion of treatment and procedures well. All questions were answered thoroughly reviewed. Debridement of mycotic and hypertrophic toenails, 1 through 5 bilateral and clearing of subungual debris. No ulceration, no infection noted.  Return Visit-Office Procedure: Patient instructed to return to the office for a follow up visit 10 weeks for continued evaluation and treatment.    Brance Dartt DPM 

## 2016-09-13 DIAGNOSIS — G4733 Obstructive sleep apnea (adult) (pediatric): Secondary | ICD-10-CM | POA: Diagnosis not present

## 2016-09-13 DIAGNOSIS — J330 Polyp of nasal cavity: Secondary | ICD-10-CM | POA: Diagnosis not present

## 2016-09-13 DIAGNOSIS — H6121 Impacted cerumen, right ear: Secondary | ICD-10-CM | POA: Diagnosis not present

## 2016-10-29 ENCOUNTER — Ambulatory Visit (INDEPENDENT_AMBULATORY_CARE_PROVIDER_SITE_OTHER): Payer: PPO | Admitting: Podiatry

## 2016-10-29 DIAGNOSIS — M79676 Pain in unspecified toe(s): Secondary | ICD-10-CM

## 2016-10-29 DIAGNOSIS — B351 Tinea unguium: Secondary | ICD-10-CM

## 2016-10-29 NOTE — Progress Notes (Signed)
Complaint:  Visit Type: Patient returns to my office for continued preventative foot care services. Complaint: Patient states" my nails have grown long and thick and become painful to walk and wear shoes"  The patient presents for preventative foot care services. No changes to ROS.  She has persistent redness at 2,4 toes left foot.    Podiatric Exam: Vascular: dorsalis pedis and posterior tibial pulses are palpable bilateral. Capillary return is immediate. Temperature gradient is WNL. Skin turgor WNL  Sensorium: Normal Semmes Weinstein monofilament test. Normal tactile sensation bilaterally. Nail Exam: Pt has thick disfigured discolored nails with subungual debris noted bilateral entire nail hallux through fifth toenails.  Her second and fourth toes are red and inflamed around proximal nail fold. Ulcer Exam: There is no evidence of ulcer or pre-ulcerative changes or infection. Orthopedic Exam: Muscle tone and strength are WNL. No limitations in general ROM. No crepitus or effusions noted. Mallet toes 2-4 left.   Skin: No Porokeratosis. No infection or ulcers  Diagnosis:  Onychomycosis, , Pain in right toe, pain in left toes  Treatment & Plan Procedures and Treatment: Consent by patient was obtained for treatment procedures. The patient understood the discussion of treatment and procedures well. All questions were answered thoroughly reviewed. Debridement of mycotic and hypertrophic toenails, 1 through 5 bilateral and clearing of subungual debris. No ulceration, no infection noted.  Return Visit-Office Procedure: Patient instructed to return to the office for a follow up visit 10 weeks for continued evaluation and treatment.    Gardiner Barefoot DPM

## 2016-12-13 DIAGNOSIS — G4733 Obstructive sleep apnea (adult) (pediatric): Secondary | ICD-10-CM | POA: Diagnosis not present

## 2016-12-13 DIAGNOSIS — J330 Polyp of nasal cavity: Secondary | ICD-10-CM | POA: Diagnosis not present

## 2017-01-11 DIAGNOSIS — R5383 Other fatigue: Secondary | ICD-10-CM | POA: Diagnosis not present

## 2017-01-11 DIAGNOSIS — E7800 Pure hypercholesterolemia, unspecified: Secondary | ICD-10-CM | POA: Diagnosis not present

## 2017-01-11 DIAGNOSIS — I10 Essential (primary) hypertension: Secondary | ICD-10-CM | POA: Diagnosis not present

## 2017-01-11 DIAGNOSIS — E559 Vitamin D deficiency, unspecified: Secondary | ICD-10-CM | POA: Diagnosis not present

## 2017-01-16 DIAGNOSIS — D485 Neoplasm of uncertain behavior of skin: Secondary | ICD-10-CM | POA: Diagnosis not present

## 2017-01-17 ENCOUNTER — Other Ambulatory Visit: Payer: Self-pay

## 2017-01-17 ENCOUNTER — Emergency Department (HOSPITAL_COMMUNITY): Payer: PPO

## 2017-01-17 ENCOUNTER — Encounter (HOSPITAL_COMMUNITY): Payer: Self-pay | Admitting: Emergency Medicine

## 2017-01-17 ENCOUNTER — Emergency Department (HOSPITAL_COMMUNITY)
Admission: EM | Admit: 2017-01-17 | Discharge: 2017-01-17 | Disposition: A | Discharge status: Home / Self Care | Payer: PPO | Attending: Emergency Medicine | Admitting: Emergency Medicine

## 2017-01-17 DIAGNOSIS — Z853 Personal history of malignant neoplasm of breast: Secondary | ICD-10-CM | POA: Diagnosis not present

## 2017-01-17 DIAGNOSIS — Y939 Activity, unspecified: Secondary | ICD-10-CM | POA: Insufficient documentation

## 2017-01-17 DIAGNOSIS — Y998 Other external cause status: Secondary | ICD-10-CM | POA: Diagnosis not present

## 2017-01-17 DIAGNOSIS — Z79899 Other long term (current) drug therapy: Secondary | ICD-10-CM | POA: Insufficient documentation

## 2017-01-17 DIAGNOSIS — E039 Hypothyroidism, unspecified: Secondary | ICD-10-CM | POA: Insufficient documentation

## 2017-01-17 DIAGNOSIS — W19XXXA Unspecified fall, initial encounter: Secondary | ICD-10-CM | POA: Diagnosis not present

## 2017-01-17 DIAGNOSIS — Y929 Unspecified place or not applicable: Secondary | ICD-10-CM | POA: Insufficient documentation

## 2017-01-17 DIAGNOSIS — I639 Cerebral infarction, unspecified: Secondary | ICD-10-CM | POA: Diagnosis not present

## 2017-01-17 DIAGNOSIS — I10 Essential (primary) hypertension: Secondary | ICD-10-CM | POA: Diagnosis not present

## 2017-01-17 DIAGNOSIS — R270 Ataxia, unspecified: Secondary | ICD-10-CM

## 2017-01-17 DIAGNOSIS — S0990XA Unspecified injury of head, initial encounter: Secondary | ICD-10-CM | POA: Diagnosis not present

## 2017-01-17 DIAGNOSIS — R42 Dizziness and giddiness: Secondary | ICD-10-CM | POA: Diagnosis not present

## 2017-01-17 LAB — BASIC METABOLIC PANEL
Anion gap: 13 (ref 5–15)
BUN: 12 mg/dL (ref 6–20)
CO2: 23 mmol/L (ref 22–32)
Calcium: 9.4 mg/dL (ref 8.9–10.3)
Chloride: 102 mmol/L (ref 101–111)
Creatinine, Ser: 0.78 mg/dL (ref 0.44–1.00)
GFR calc Af Amer: >60 mL/min (ref >60)
GFR calc non Af Amer: >60 mL/min (ref >60)
Glucose, Bld: 145 mg/dL — ABNORMAL HIGH (ref 65–99)
Potassium: 3.7 mmol/L (ref 3.5–5.1)
Sodium: 138 mmol/L (ref 135–145)

## 2017-01-17 LAB — URINALYSIS, ROUTINE W REFLEX MICROSCOPIC
Bilirubin Urine: NEGATIVE
GLUCOSE, UA: NEGATIVE mg/dL
Hgb urine dipstick: NEGATIVE
KETONES UR: NEGATIVE mg/dL
Nitrite: NEGATIVE
Protein, ur: NEGATIVE mg/dL
Specific Gravity, Urine: 1.008 (ref 1.005–1.030)
pH: 6 (ref 5.0–8.0)

## 2017-01-17 LAB — CBC
HCT: 40.3 % (ref 36.0–46.0)
Hemoglobin: 13.1 g/dL (ref 12.0–15.0)
MCH: 28.2 pg (ref 26.0–34.0)
MCHC: 32.5 g/dL (ref 30.0–36.0)
MCV: 86.9 fL (ref 78.0–100.0)
Platelets: 175 10*3/uL (ref 150–400)
RBC: 4.64 MIL/uL (ref 3.87–5.11)
RDW: 15.4 % (ref 11.5–15.5)
WBC: 8.5 10*3/uL (ref 4.0–10.5)

## 2017-01-17 LAB — CBG MONITORING, ED: Glucose-Capillary: 91 mg/dL (ref 65–99)

## 2017-01-17 NOTE — ED Notes (Signed)
ED Provider at bedside. 

## 2017-01-17 NOTE — Discharge Instructions (Signed)
To finish the workup for your stroke you will need to have an MRI, and ultrasound of your carotid arteries, and an echocardiogram of your heart.  Please share this with your family doctor and have them arrange for these to be done as an outpatient.  You must take your aspirin daily, 325 mg every single day.  Do not miss any doses.  Please return to the emergency department for severe or worsening symptoms including weakness, numbness, slurred speech, blurred vision or increased difficulty with walking.  Please use a walker instead of a cane, this is for safety purposes so that you do not fall.

## 2017-01-17 NOTE — ED Provider Notes (Signed)
[facility] EMERGENCY DEPARTMENT Provider Note   CSN: [id number] Arrival date & time: 01/17/17  1523     History   Chief Complaint Chief Complaint  Patient presents with  . Dizziness    HPI [name] is a 84 y.o. female.  HPI  The patient states that for the last several years she has had some difficulty with balance, she reports that she feels "dizzy" and what she describes this is a difficulty with balance when she is walking occasionally.  She does not feel dizzy when she is at rest, sitting, laying down however she does feel like she has to hold onto things when she walks sometimes.  She is able to drive without difficulty and fine motor movements are of no concern.  She had seen her ear nose and throat doctor about this in the past, they recommended her to the equilibrium clinic in [place], this did not do much for her.  She continues to be dizzy however she has had 3 head injuries over the last couple of years 2 of which were in the last couple of weeks.  These occurred when she fell and struck the back of her head on New Year's Eve and then fell and struck the side of her head recently.  No loss of consciousness, no weakness or numbness of the arms or the legs, no seizures or vomiting or blurred vision.  She does not have any headache.  Her dizziness is persistent when she walks but again it goes away when she is resting.  No vertigo, no syncope, no palpitations chest pain shortness of breath abdominal pain or diarrhea  Past Medical History:  Diagnosis Date  . Arthritis   . Breast cancer ([place]) 2012   left  breast  . GERD (gastroesophageal reflux disease)   . Hypertension   . Hypothyroidism   . Personal history of radiation therapy   . Sleep apnea    uses CPAP    Patient Active Problem List   Diagnosis Date Noted  . Unspecified constipation 08/04/2012  . Unspecified hypothyroidism 07/31/2012  . Obstructive sleep apnea 07/31/2012  . E. coli  UTI (urinary tract infection), fluoroquinolone resistant 07/26/2012  . Essential hypertension, benign 07/26/2012  . Ileus, postoperative ([place]) 07/24/2012  . Hyponatremia 07/24/2012  . Arthritis of knee, left 07/21/2012    Past Surgical History:  Procedure Laterality Date  . ABDOMINAL HYSTERECTOMY    . APPENDECTOMY    . BREAST BIOPSY Left 2012   Positive  . BREAST BIOPSY Right    at age 12's  . BREAST LUMPECTOMY WITH AXILLARY LYMPH NODE BIOPSY Left 2012   with Radiation  . BREAST SURGERY Left 2012   Lumpectomy  . CARDIAC CATHETERIZATION     10 years ago  . TONSILLECTOMY    . TOTAL KNEE ARTHROPLASTY Right 07/21/2012   Dr Mayer Camel  . TOTAL KNEE ARTHROPLASTY Left 07/21/2012   Procedure: TOTAL KNEE ARTHROPLASTY;  Surgeon: Kerin Salen, MD;  Location: [facility];  Service: Orthopedics;  Laterality: Left;    OB History    No data available       Home Medications    Prior to Admission medications   Medication Sig Start Date End Date Taking? Authorizing Provider  amLODipine (NORVASC) 2.5 MG tablet Take 2.5 mg by mouth 2 (two) times daily.  05/22/16  Yes [provider]  azelastine (ASTELIN) 0.1 % nasal spray Place 1 spray into both nostrils 2 (two) times daily as needed  for rhinitis or allergies.  07/16/16  Yes [provider]  CALCIUM PO Take 1 tablet by mouth daily.   Yes [provider]  Cholecalciferol (VITAMIN D3) 5000 UNITS TABS Take 5,000 Units by mouth daily.   Yes [provider]  Coenzyme Q10 (CO Q-10) 100 MG CAPS Take 100 mg by mouth daily.   Yes [provider]  DYMISTA 137-50 MCG/ACT SUSP Place 1 spray into both nostrils 2 (two) times daily.  07/30/16  Yes [provider]  fluocinonide cream (LIDEX) 4.09 % Apply 1 application topically as needed (irritation).  07/16/13  Yes [provider]  gentamicin ointment (GARAMYCIN) 0.1 % Apply 1 application topically 3 (three) times daily. Apply to bottom of nose for CPAP mask  01/09/16  Yes [provider]  metoprolol succinate (TOPROL-XL) 25 MG 24 hr tablet Take 25 mg by mouth daily.  05/29/16  Yes [provider]  Omega-3 Fatty Acids (FISH OIL PO) Take 1 capsule by mouth daily.    Yes [provider]  tolterodine (DETROL LA) 2 MG 24 hr capsule Take 2 mg by mouth daily as needed (bladder relaxant).  07/24/16  Yes [provider]    Family History Family History  Problem Relation Age of Onset  . Breast cancer Neg Hx     Social History Social History   Tobacco Use  . Smoking status: Never Smoker  . Smokeless tobacco: Never Used  Substance Use Topics  . Alcohol use: Yes    Comment: social  . Drug use: No     Allergies   Sulfa antibiotics and Nsaids   Review of Systems Review of Systems  All other systems reviewed and are negative.    Physical Exam Updated Vital Signs BP (!) 149/73   Pulse 62   Temp (!) 97.5 F (36.4 C) (Oral)   Resp 13   SpO2 96%   Physical Exam  Constitutional: She appears well-developed and well-nourished. No distress.  HENT:  Head: Normocephalic and atraumatic.  Mouth/Throat: Oropharynx is clear and moist. No oropharyngeal exudate.  Eyes: Conjunctivae and EOM are normal. Pupils are equal, round, and reactive to light. Right eye exhibits no discharge. Left eye exhibits no discharge. No scleral icterus.  Mild bruising around the nasal area of the R globe.  Neck: Normal range of motion. Neck supple. No JVD present. No thyromegaly present.  Cardiovascular: Normal rate, regular rhythm, normal heart sounds and intact distal pulses. Exam reveals no gallop and no friction rub.  No murmur heard. Pulmonary/Chest: Effort normal and breath sounds normal. No respiratory distress. She has no wheezes. She has no rales.  Abdominal: Soft. Bowel sounds are normal. She exhibits no distension and no mass. There is no tenderness.  Musculoskeletal: Normal range of motion. She exhibits no edema or  tenderness.  Lymphadenopathy:    She has no cervical adenopathy.  Neurological: She is alert. Coordination normal.  Neurologic exam:  Speech clear, pupils equal round reactive to light, extraocular movements intact  Normal peripheral visual fields Cranial nerves III through XII normal including no facial droop Follows commands, moves all extremities x4, normal strength to bilateral upper and lower extremities at all major muscle groups including grip Sensation normal to light touch and pinprick Coordination intact, no limb ataxia, finger-nose-finger normal Rapid alternating movements normal No pronator drift Normal heel shin   Skin: Skin is warm and dry. No rash noted. No erythema.  Psychiatric: She has a normal mood and affect. Her behavior is normal.  Nursing note and vitals reviewed.    ED Treatments / Results  Labs (all labs ordered are listed, but only abnormal results are displayed) Labs Reviewed  BASIC METABOLIC PANEL - Abnormal; Notable for the following components:      Result Value   Glucose, Bld 145 (*)    All other components within normal limits  URINALYSIS, ROUTINE W REFLEX MICROSCOPIC - Abnormal; Notable for the following components:   Leukocytes, UA LARGE (*)    Bacteria, UA RARE (*)    Squamous Epithelial / LPF 0-5 (*)    Non Squamous Epithelial 0-5 (*)    All other components within normal limits  URINE CULTURE  CBC  CBG MONITORING, ED    EKG  EKG Interpretation  Date/Time:  Thursday January 17 2017 15:29:33 EST Ventricular Rate:  75 PR Interval:  142 QRS Duration: 80 QT Interval:  406 QTC Calculation: 453 R Axis:   71 Text Interpretation:  Normal sinus rhythm Right atrial enlargement Nonspecific ST abnormality Abnormal ECG since last tracing no significant change Confirmed by Noemi Chapel [phone]) on 01/17/2017 6:57:04 PM       Radiology Ct Head Wo Contrast  Result Date: 01/17/2017 CLINICAL DATA:  [place], hit head, dizzy EXAM: CT HEAD WITHOUT  CONTRAST TECHNIQUE: Contiguous axial images were obtained from the base of the skull through the vertex without intravenous contrast. COMPARISON:  02/25/2016, MRI 09/20/2014 FINDINGS: Brain: No acute territorial infarction, hemorrhage or intracranial mass is visualized. Encephalomalacia within the bilateral cerebellar hemispheres consistent with old infarcts but new since head CT February 2018. Atrophy and small vessel ischemic changes of the white matter. Stable ventricle size. Vascular: No hyperdense vessels. Vertebral artery and carotid artery calcification. Skull: No depressed skull fracture Sinuses/Orbits: Prominent soft tissue density within the anterior nasal passages similar compared to prior studies Other: None IMPRESSION: 1. No CT evidence for acute intracranial abnormality. Atrophy and small vessel ischemic changes of the white matter. Remote appearing infarcts in the cerebellar hemispheres but new since CT dated February 2018. 2. Prominent soft tissue density in the anterior nasal vault for which direct inspection was previously recommended Electronically Signed   By: Donavan Foil M.D.   On: 01/17/2017 20:29    Procedures Procedures (including critical care time)  Medications Ordered in ED Medications - No data to display   Initial Impression / Assessment and Plan / ED Course  I have reviewed the triage vital signs and the nursing notes.  Pertinent labs & imaging results that were available during my care of the patient were reviewed by me and considered in my medical decision making (see chart for details).     Neuro exam unremarkable -  Needs CT to r/o hemorrhage or fracture Well appaering otherwise li9kely needs MRI if neg - can be done outpt -  D/w family and pt - they are in agreement. Years of symptoms - brought to lightly only b/c of recent head injury  CT scan confirms cerebellar infarcts which are old, the patient has had symptoms for quite some time and I do not think  that her symptoms are related to an acute stroke.  She was given reassurance that her fall with a head injury did not cause the strokes.  She can resume her workup as an outpatient for the MRI ultrasound of the neck and echocardiogram and she states that her family doctor will in fact be able to coordinate all of this in a timely manner.  She reports that she is not  actively taking her aspirin, she has been encouraged to take this every single day and has agreed.  Family members with the patient are in agreement as well.  Final Clinical Impressions(s) / ED Diagnoses   Final diagnoses:  Cerebellar stroke [facility])  Ataxia    ED Discharge Orders    None       Noemi Chapel, MD 01/17/17 2225

## 2017-01-17 NOTE — ED Triage Notes (Signed)
Pt reports falling on new years eve and hitting her head, was checked out by the nurse at assisted living facility and told she was okay. Pt reports last Thursday she got her foot hung and fell and hit her head again. No LOC either time but pt reports dizziness since second fall. Pt a/ox4 resp e/u, nad. No neuro defs noted. No blood thinners

## 2017-01-19 LAB — URINE CULTURE

## 2017-01-21 DIAGNOSIS — G4730 Sleep apnea, unspecified: Secondary | ICD-10-CM | POA: Diagnosis not present

## 2017-01-21 DIAGNOSIS — R42 Dizziness and giddiness: Secondary | ICD-10-CM | POA: Insufficient documentation

## 2017-01-21 DIAGNOSIS — I10 Essential (primary) hypertension: Secondary | ICD-10-CM | POA: Diagnosis not present

## 2017-01-21 DIAGNOSIS — Z8673 Personal history of transient ischemic attack (TIA), and cerebral infarction without residual deficits: Secondary | ICD-10-CM | POA: Diagnosis not present

## 2017-01-23 DIAGNOSIS — J330 Polyp of nasal cavity: Secondary | ICD-10-CM | POA: Diagnosis not present

## 2017-01-23 DIAGNOSIS — K121 Other forms of stomatitis: Secondary | ICD-10-CM | POA: Diagnosis not present

## 2017-01-23 DIAGNOSIS — G4733 Obstructive sleep apnea (adult) (pediatric): Secondary | ICD-10-CM | POA: Diagnosis not present

## 2017-01-29 DIAGNOSIS — Z8673 Personal history of transient ischemic attack (TIA), and cerebral infarction without residual deficits: Secondary | ICD-10-CM | POA: Diagnosis not present

## 2017-01-29 DIAGNOSIS — I10 Essential (primary) hypertension: Secondary | ICD-10-CM | POA: Diagnosis not present

## 2017-01-29 DIAGNOSIS — R42 Dizziness and giddiness: Secondary | ICD-10-CM | POA: Diagnosis not present

## 2017-01-29 DIAGNOSIS — I6523 Occlusion and stenosis of bilateral carotid arteries: Secondary | ICD-10-CM | POA: Diagnosis not present

## 2017-01-31 ENCOUNTER — Ambulatory Visit: Payer: PPO | Admitting: Podiatry

## 2017-02-04 ENCOUNTER — Ambulatory Visit: Payer: PPO | Admitting: Podiatry

## 2017-02-04 DIAGNOSIS — Z8673 Personal history of transient ischemic attack (TIA), and cerebral infarction without residual deficits: Secondary | ICD-10-CM | POA: Diagnosis not present

## 2017-02-04 DIAGNOSIS — R270 Ataxia, unspecified: Secondary | ICD-10-CM | POA: Diagnosis not present

## 2017-02-04 DIAGNOSIS — I10 Essential (primary) hypertension: Secondary | ICD-10-CM | POA: Diagnosis not present

## 2017-02-04 DIAGNOSIS — G4733 Obstructive sleep apnea (adult) (pediatric): Secondary | ICD-10-CM | POA: Diagnosis not present

## 2017-02-04 DIAGNOSIS — R42 Dizziness and giddiness: Secondary | ICD-10-CM | POA: Diagnosis not present

## 2017-02-04 DIAGNOSIS — R001 Bradycardia, unspecified: Secondary | ICD-10-CM | POA: Diagnosis not present

## 2017-02-07 ENCOUNTER — Ambulatory Visit: Payer: PPO | Admitting: Podiatry

## 2017-02-07 ENCOUNTER — Encounter: Payer: Self-pay | Admitting: Podiatry

## 2017-02-07 DIAGNOSIS — M79676 Pain in unspecified toe(s): Secondary | ICD-10-CM

## 2017-02-07 DIAGNOSIS — B351 Tinea unguium: Secondary | ICD-10-CM | POA: Diagnosis not present

## 2017-02-07 NOTE — Progress Notes (Signed)
Complaint:  Visit Type: Patient returns to my office for continued preventative foot care services. Complaint: Patient states" my nails have grown long and thick and become painful to walk and wear shoes"  The patient presents for preventative foot care services. No changes to ROS.  She has persistent redness at 2,4 toes left foot.    Podiatric Exam: Vascular: dorsalis pedis and posterior tibial pulses are palpable bilateral. Capillary return is immediate. Temperature gradient is WNL. Skin turgor WNL  Sensorium: Normal Semmes Weinstein monofilament test. Normal tactile sensation bilaterally. Nail Exam: Pt has thick disfigured discolored nails with subungual debris noted bilateral entire nail hallux through fifth toenails.  Her second and fourth toes are red and inflamed around proximal nail fold. Ulcer Exam: There is no evidence of ulcer or pre-ulcerative changes or infection. Orthopedic Exam: Muscle tone and strength are WNL. No limitations in general ROM. No crepitus or effusions noted. Mallet toes 2-4 left.   Skin: No Porokeratosis. No infection or ulcers  Diagnosis:  Onychomycosis, , Pain in right toe, pain in left toes  Treatment & Plan Procedures and Treatment: Consent by patient was obtained for treatment procedures. The patient understood the discussion of treatment and procedures well. All questions were answered thoroughly reviewed. Debridement of mycotic and hypertrophic toenails, 1 through 5 bilateral and clearing of subungual debris. No ulceration, no infection noted. Cauterization right hallux bandaged with band-aid.  Patient reacted to with movement with the use of the dremel. Return Visit-Office Procedure: Patient instructed to return to the office for a follow up visit 10 weeks for continued evaluation and treatment.    Gardiner Barefoot DPM

## 2017-02-18 DIAGNOSIS — G4730 Sleep apnea, unspecified: Secondary | ICD-10-CM | POA: Diagnosis not present

## 2017-02-18 DIAGNOSIS — I10 Essential (primary) hypertension: Secondary | ICD-10-CM | POA: Diagnosis not present

## 2017-02-18 DIAGNOSIS — R42 Dizziness and giddiness: Secondary | ICD-10-CM | POA: Diagnosis not present

## 2017-02-18 DIAGNOSIS — R001 Bradycardia, unspecified: Secondary | ICD-10-CM | POA: Diagnosis not present

## 2017-02-18 DIAGNOSIS — Z8673 Personal history of transient ischemic attack (TIA), and cerebral infarction without residual deficits: Secondary | ICD-10-CM | POA: Diagnosis not present

## 2017-03-01 DIAGNOSIS — G4733 Obstructive sleep apnea (adult) (pediatric): Secondary | ICD-10-CM | POA: Diagnosis not present

## 2017-03-08 DIAGNOSIS — R2681 Unsteadiness on feet: Secondary | ICD-10-CM | POA: Diagnosis not present

## 2017-03-08 DIAGNOSIS — R42 Dizziness and giddiness: Secondary | ICD-10-CM | POA: Diagnosis not present

## 2017-03-25 ENCOUNTER — Other Ambulatory Visit: Payer: Self-pay | Admitting: Family Medicine

## 2017-03-25 DIAGNOSIS — Z1231 Encounter for screening mammogram for malignant neoplasm of breast: Secondary | ICD-10-CM

## 2017-03-28 DIAGNOSIS — R42 Dizziness and giddiness: Secondary | ICD-10-CM | POA: Diagnosis not present

## 2017-04-08 DIAGNOSIS — R42 Dizziness and giddiness: Secondary | ICD-10-CM | POA: Diagnosis not present

## 2017-04-18 DIAGNOSIS — R42 Dizziness and giddiness: Secondary | ICD-10-CM | POA: Diagnosis not present

## 2017-04-26 DIAGNOSIS — Z961 Presence of intraocular lens: Secondary | ICD-10-CM | POA: Diagnosis not present

## 2017-04-26 DIAGNOSIS — H524 Presbyopia: Secondary | ICD-10-CM | POA: Diagnosis not present

## 2017-04-26 DIAGNOSIS — H04123 Dry eye syndrome of bilateral lacrimal glands: Secondary | ICD-10-CM | POA: Diagnosis not present

## 2017-04-26 DIAGNOSIS — R42 Dizziness and giddiness: Secondary | ICD-10-CM | POA: Diagnosis not present

## 2017-05-02 DIAGNOSIS — R42 Dizziness and giddiness: Secondary | ICD-10-CM | POA: Diagnosis not present

## 2017-05-03 ENCOUNTER — Ambulatory Visit
Admission: RE | Admit: 2017-05-03 | Discharge: 2017-05-03 | Disposition: A | Discharge status: Home / Self Care | Payer: PPO | Source: Ambulatory Visit | Attending: Family Medicine | Admitting: Family Medicine

## 2017-05-03 DIAGNOSIS — Z853 Personal history of malignant neoplasm of breast: Secondary | ICD-10-CM | POA: Diagnosis not present

## 2017-05-03 DIAGNOSIS — Z1231 Encounter for screening mammogram for malignant neoplasm of breast: Secondary | ICD-10-CM

## 2017-05-03 DIAGNOSIS — R922 Inconclusive mammogram: Secondary | ICD-10-CM | POA: Diagnosis not present

## 2017-05-09 ENCOUNTER — Ambulatory Visit: Payer: PPO | Admitting: Podiatry

## 2017-05-13 DIAGNOSIS — I10 Essential (primary) hypertension: Secondary | ICD-10-CM | POA: Diagnosis not present

## 2017-05-13 DIAGNOSIS — R001 Bradycardia, unspecified: Secondary | ICD-10-CM | POA: Diagnosis not present

## 2017-05-13 DIAGNOSIS — R42 Dizziness and giddiness: Secondary | ICD-10-CM | POA: Diagnosis not present

## 2017-05-13 DIAGNOSIS — Z8673 Personal history of transient ischemic attack (TIA), and cerebral infarction without residual deficits: Secondary | ICD-10-CM | POA: Diagnosis not present

## 2017-05-13 DIAGNOSIS — G4730 Sleep apnea, unspecified: Secondary | ICD-10-CM | POA: Diagnosis not present

## 2017-05-14 DIAGNOSIS — R42 Dizziness and giddiness: Secondary | ICD-10-CM | POA: Diagnosis not present

## 2017-05-20 DIAGNOSIS — R42 Dizziness and giddiness: Secondary | ICD-10-CM | POA: Diagnosis not present

## 2017-05-29 ENCOUNTER — Ambulatory Visit: Payer: PPO | Admitting: Podiatry

## 2017-05-29 ENCOUNTER — Encounter: Payer: Self-pay | Admitting: Podiatry

## 2017-05-29 DIAGNOSIS — B351 Tinea unguium: Secondary | ICD-10-CM

## 2017-05-29 DIAGNOSIS — M79676 Pain in unspecified toe(s): Secondary | ICD-10-CM

## 2017-05-29 NOTE — Progress Notes (Signed)
She presents today chief complaint of painful elongated toenails.  Objective: Vital signs are stable alert and oriented x3.  Pulses are palpable.  Neurologic sensorium is intact toenails are thick yellow dystrophic-like mycotic painful palpation.  Assessment: Pain elicited onychomycosis.  Plan: Discussed etiology pathology conservative surgical therapies.  Instructed her to purchase Lamisil AT cream to apply to nail borders were debrided nails bilaterally today.  Follow-up with her in 3 months.

## 2017-06-17 DIAGNOSIS — J330 Polyp of nasal cavity: Secondary | ICD-10-CM | POA: Diagnosis not present

## 2017-06-17 DIAGNOSIS — H6121 Impacted cerumen, right ear: Secondary | ICD-10-CM | POA: Diagnosis not present

## 2017-06-17 DIAGNOSIS — G4733 Obstructive sleep apnea (adult) (pediatric): Secondary | ICD-10-CM | POA: Diagnosis not present

## 2017-07-08 IMAGING — CT CT HEAD W/O CM
4 series · 18 of 47 positions shown, 20 images · non-contrast
Comparison: MRI brain 09/20/2014

CLINICAL DATA: Fall 1 week ago hitting head on floor.  Confusion.

EXAM:
CT HEAD WITHOUT CONTRAST
TECHNIQUE: Contiguous axial images were obtained from the base of the skull
through the vertex without intravenous contrast.

[Series 201: head w/o, idose (1) · axial · non-contrast · 0.36mm/px · z∈[+64,+174]mm · 8 of 30 slices shown, 10 images]
[im 4/30  brain]
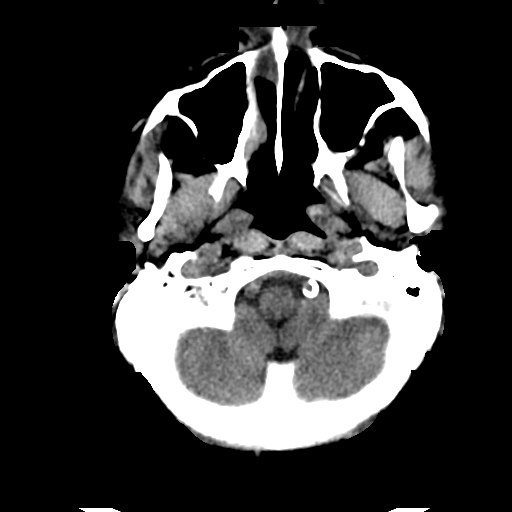
[im 4/30  bone]
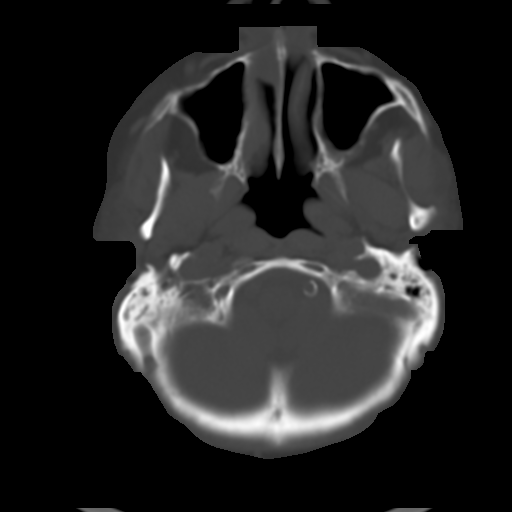
[im 7/30  brain]
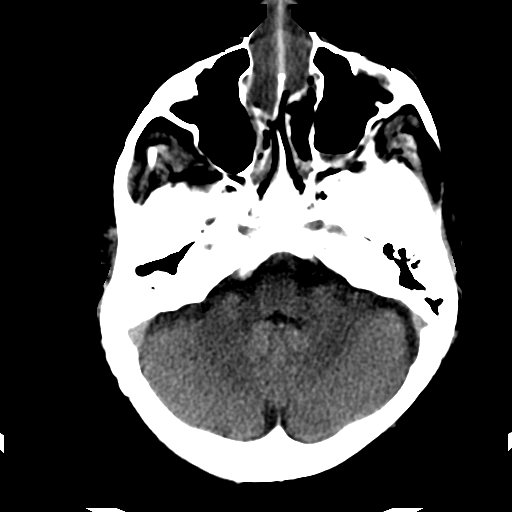
[im 10/30  brain]
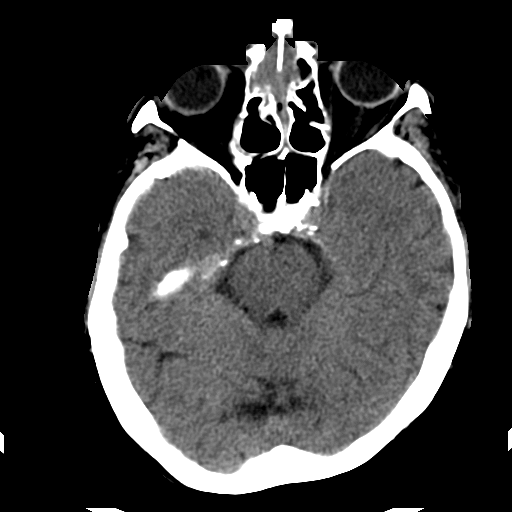
[im 13/30  brain]
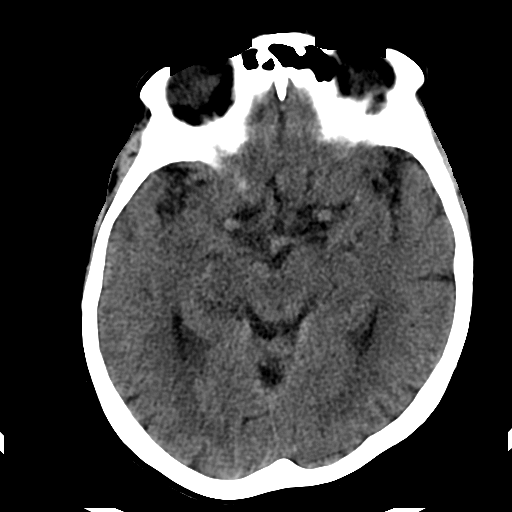
[im 17/30  brain]
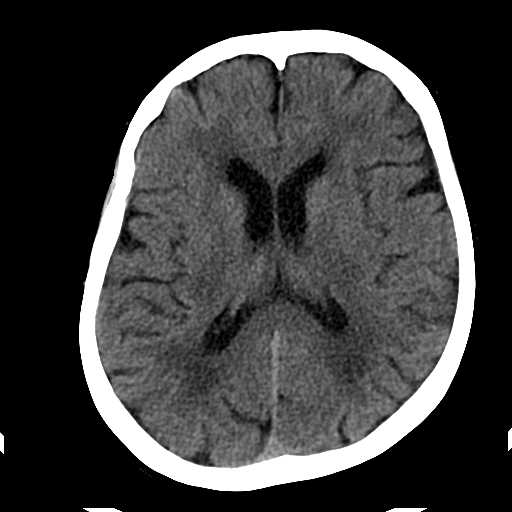
[im 17/30  bone]
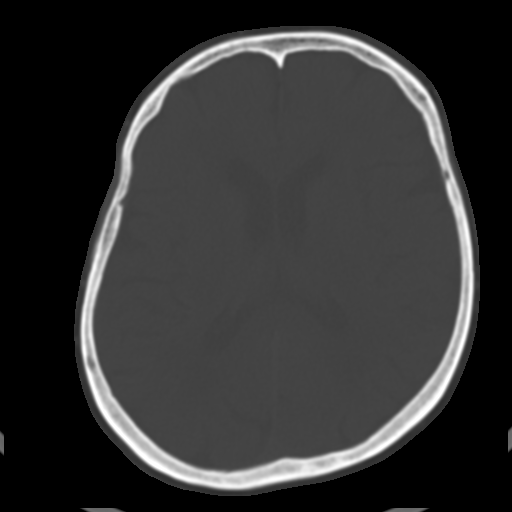
[im 20/30  brain]
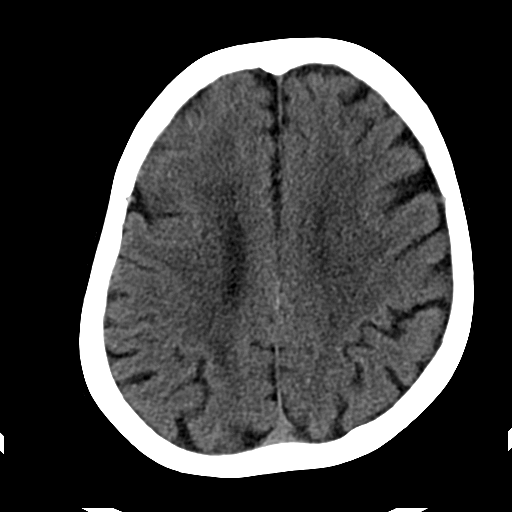
[im 23/30  brain]
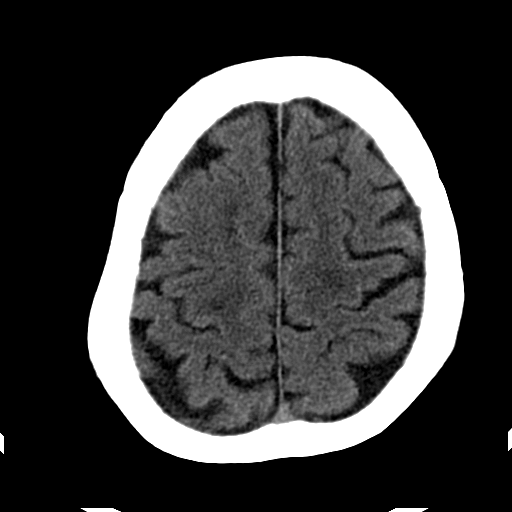
[im 26/30  brain]
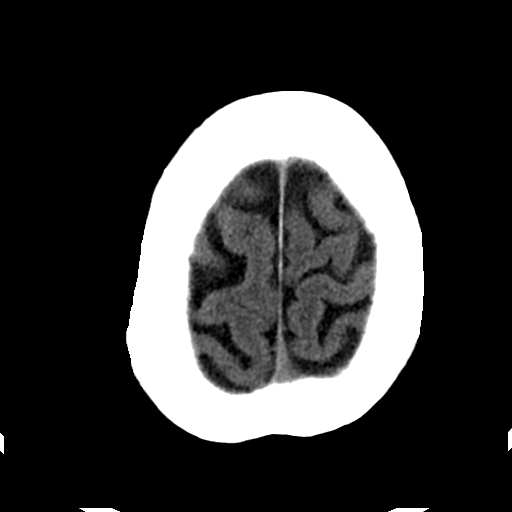

[Series 202: head w/o bone, idose (1) · axial · non-contrast · 0.36mm/px · z∈[+63,+108]mm · 4 of 60 slices shown]
[im 7/60  bone]
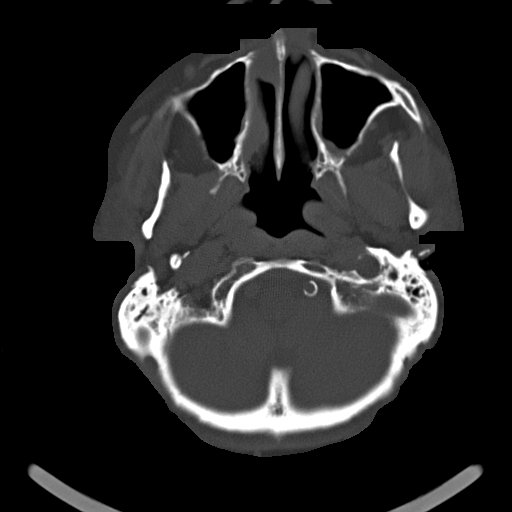
[im 13/60  bone]
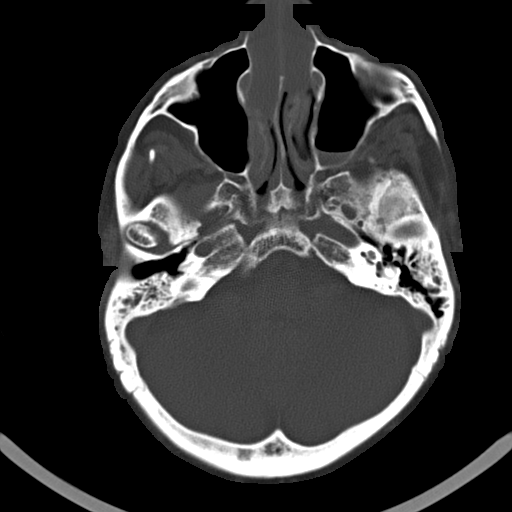
[im 19/60  bone]
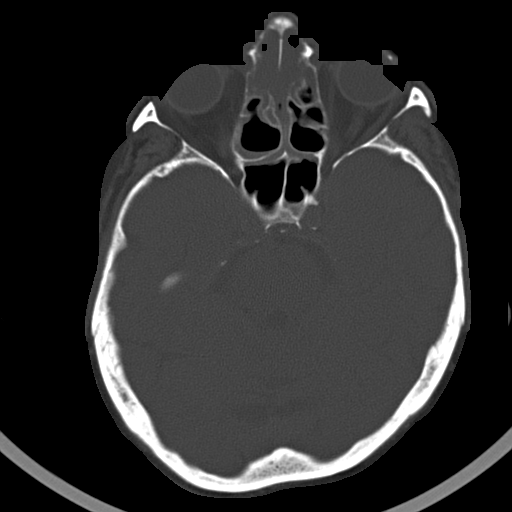
[im 25/60  bone]
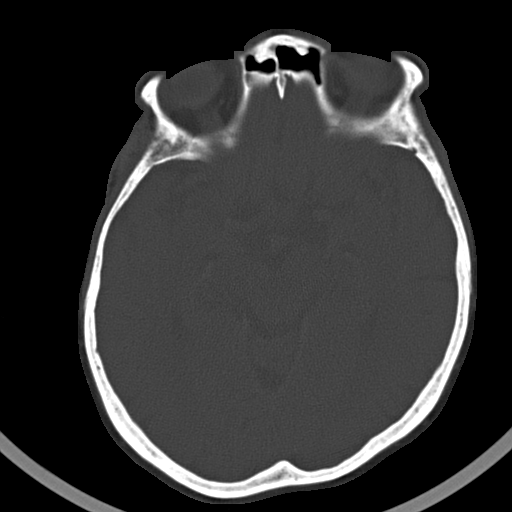

[Series 203: coronal st, idose (1) · coronal · 0.36mm/px · 3 of 60 slices shown]
[im 20/60  brain]
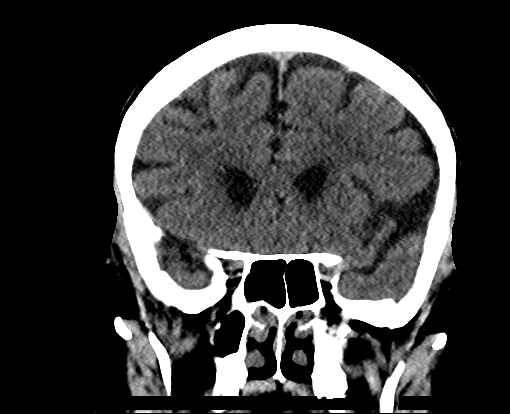
[im 27/60  brain]
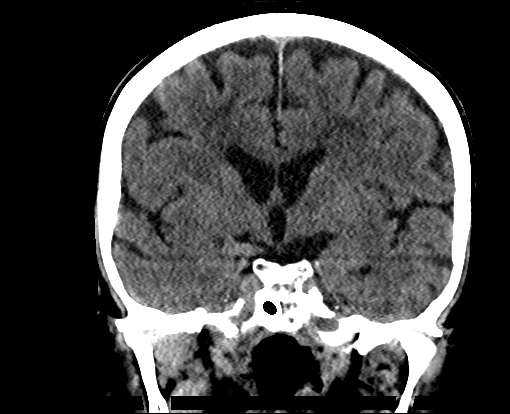
[im 33/60  brain]
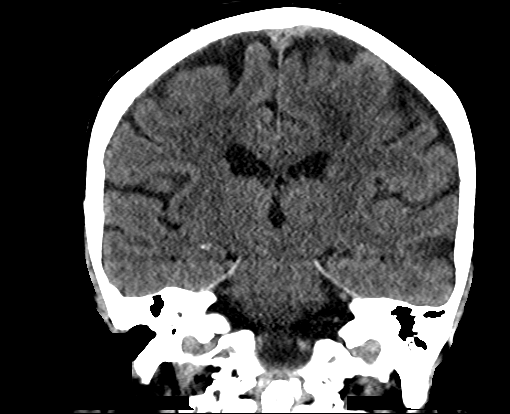

[Series 204: sagittal st, idose (1) · sagittal · 0.36mm/px · 3 of 60 slices shown]
[im 20/60  brain]
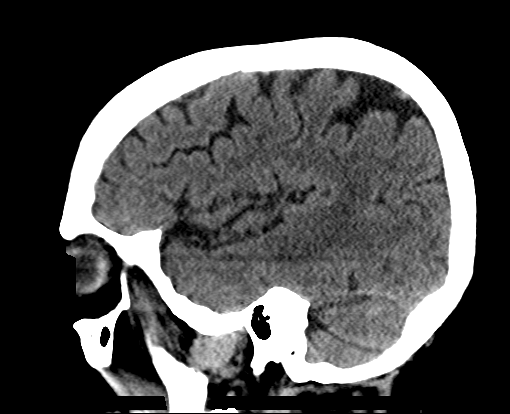
[im 30/60  brain]
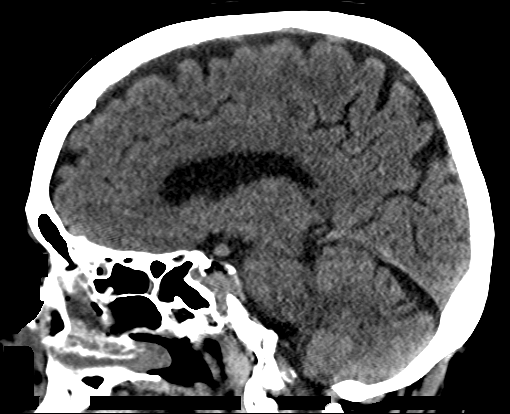
[im 40/60  brain]
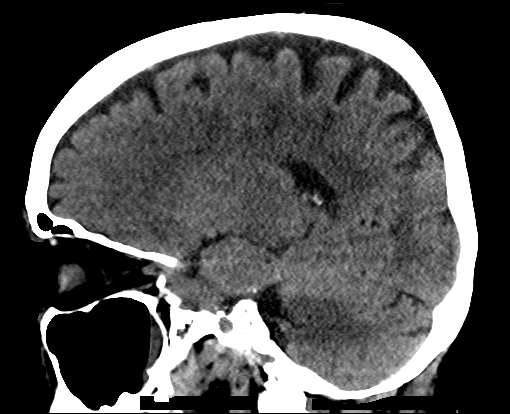

[18 of 47 positions shown; findings below may reference images not displayed]

FINDINGS: Brain: Ventricles, cisterns and other CSF spaces are within normal.
There is no mass, mass effect, shift of midline structures or acute
hemorrhage. No evidence of acute infarction. There is mild chronic
ischemic microvascular disease.

Vascular: Minimal calcified plaque over the cavernous segment of the
internal carotid arteries as well as the left vertebral artery.

Skull: Within normal.

Sinuses/Orbits: Orbits are within normal. There is opacification
over the ethmoid sinus with subtle air-fluid level over the left
maxillary sinus. Mild opacification of the mastoid air cells.

Other: None.
IMPRESSION: No acute intracranial findings.

Chronic ischemic microvascular disease.

The chronic sinus inflammatory disease as cannot exclude an acute
component involving the left maxillary sinus.

## 2017-07-19 DIAGNOSIS — G4733 Obstructive sleep apnea (adult) (pediatric): Secondary | ICD-10-CM | POA: Diagnosis not present

## 2017-08-20 DIAGNOSIS — H6121 Impacted cerumen, right ear: Secondary | ICD-10-CM | POA: Diagnosis not present

## 2017-08-20 DIAGNOSIS — G4733 Obstructive sleep apnea (adult) (pediatric): Secondary | ICD-10-CM | POA: Diagnosis not present

## 2017-08-20 DIAGNOSIS — J330 Polyp of nasal cavity: Secondary | ICD-10-CM | POA: Diagnosis not present

## 2017-09-04 ENCOUNTER — Ambulatory Visit: Payer: PPO | Admitting: Podiatry

## 2017-09-04 ENCOUNTER — Encounter: Payer: Self-pay | Admitting: Podiatry

## 2017-09-04 DIAGNOSIS — M79676 Pain in unspecified toe(s): Secondary | ICD-10-CM | POA: Diagnosis not present

## 2017-09-04 DIAGNOSIS — B351 Tinea unguium: Secondary | ICD-10-CM

## 2017-09-04 NOTE — Progress Notes (Signed)
She presents today chief complaint of painful elongated toenails.  Objective: Toenails are long thick yellow dystrophic clinically mycotic no open lesions or wounds.  Assessment: Pain in limb secondary to onychomycosis.  Plan: Debridement of toenails.

## 2017-10-14 DIAGNOSIS — I10 Essential (primary) hypertension: Secondary | ICD-10-CM | POA: Diagnosis not present

## 2017-10-14 DIAGNOSIS — M810 Age-related osteoporosis without current pathological fracture: Secondary | ICD-10-CM | POA: Diagnosis not present

## 2017-10-14 DIAGNOSIS — G4700 Insomnia, unspecified: Secondary | ICD-10-CM | POA: Diagnosis not present

## 2017-10-14 DIAGNOSIS — E7800 Pure hypercholesterolemia, unspecified: Secondary | ICD-10-CM | POA: Diagnosis not present

## 2017-10-14 DIAGNOSIS — J309 Allergic rhinitis, unspecified: Secondary | ICD-10-CM | POA: Diagnosis not present

## 2017-10-14 DIAGNOSIS — E039 Hypothyroidism, unspecified: Secondary | ICD-10-CM | POA: Diagnosis not present

## 2017-10-14 DIAGNOSIS — N3281 Overactive bladder: Secondary | ICD-10-CM | POA: Diagnosis not present

## 2017-10-24 DIAGNOSIS — Z96652 Presence of left artificial knee joint: Secondary | ICD-10-CM | POA: Diagnosis not present

## 2017-10-24 DIAGNOSIS — M25562 Pain in left knee: Secondary | ICD-10-CM | POA: Diagnosis not present

## 2017-11-14 DIAGNOSIS — G4733 Obstructive sleep apnea (adult) (pediatric): Secondary | ICD-10-CM | POA: Diagnosis not present

## 2017-11-14 DIAGNOSIS — H43813 Vitreous degeneration, bilateral: Secondary | ICD-10-CM | POA: Diagnosis not present

## 2017-11-18 DIAGNOSIS — G4733 Obstructive sleep apnea (adult) (pediatric): Secondary | ICD-10-CM | POA: Diagnosis not present

## 2017-11-18 DIAGNOSIS — H6121 Impacted cerumen, right ear: Secondary | ICD-10-CM | POA: Diagnosis not present

## 2017-11-18 DIAGNOSIS — J330 Polyp of nasal cavity: Secondary | ICD-10-CM | POA: Diagnosis not present

## 2017-12-04 ENCOUNTER — Ambulatory Visit: Payer: PPO | Admitting: Podiatry

## 2017-12-04 ENCOUNTER — Encounter: Payer: Self-pay | Admitting: Podiatry

## 2017-12-04 DIAGNOSIS — B351 Tinea unguium: Secondary | ICD-10-CM

## 2017-12-04 DIAGNOSIS — M79676 Pain in unspecified toe(s): Secondary | ICD-10-CM

## 2017-12-04 DIAGNOSIS — L03032 Cellulitis of left toe: Secondary | ICD-10-CM

## 2017-12-04 MED ORDER — DOXYCYCLINE HYCLATE 100 MG PO TABS: 100.0000 mg | ORAL_TABLET | Freq: Two times a day (BID) | ORAL | 0 refills | Status: DC

## 2017-12-04 NOTE — Progress Notes (Signed)
Presents today for routine nail trim and diabetic foot care.  Objective: Vital signs are stable alert and oriented x3.  Toes #2 and 4 on the left foot do demonstrate some mild cellulitis surrounding the nails.  Debrided today do not demonstrate any purulence or malodor.  Pulses remain palpable.  Assessment: Pain in limb secondary to onychomycosis and cellulitis second digit and fourth digit left.  Plan: Debrided nails 1 through 5 bilaterally debrided all necrotic tissue today.  Did not have to perform any type of block.  She did really well with this I also wrote a prescription for doxycycline we will follow-up with her in a couple of weeks.  Also we will follow-up with her in 3 months for routine nails.

## 2018-01-14 DIAGNOSIS — E7800 Pure hypercholesterolemia, unspecified: Secondary | ICD-10-CM | POA: Diagnosis not present

## 2018-01-14 DIAGNOSIS — N3281 Overactive bladder: Secondary | ICD-10-CM | POA: Diagnosis not present

## 2018-01-14 DIAGNOSIS — R42 Dizziness and giddiness: Secondary | ICD-10-CM | POA: Diagnosis not present

## 2018-01-14 DIAGNOSIS — G4700 Insomnia, unspecified: Secondary | ICD-10-CM | POA: Diagnosis not present

## 2018-01-14 DIAGNOSIS — J309 Allergic rhinitis, unspecified: Secondary | ICD-10-CM | POA: Diagnosis not present

## 2018-03-05 ENCOUNTER — Ambulatory Visit: Payer: PPO | Admitting: Podiatry

## 2018-03-05 ENCOUNTER — Encounter: Payer: Self-pay | Admitting: Podiatry

## 2018-03-05 DIAGNOSIS — B351 Tinea unguium: Secondary | ICD-10-CM | POA: Diagnosis not present

## 2018-03-05 DIAGNOSIS — M79676 Pain in unspecified toe(s): Secondary | ICD-10-CM

## 2018-03-05 NOTE — Progress Notes (Signed)
She presents today for follow-up of her cellulitis of her second and fourth toes.  States that they are going much better seems to be all gone now.  She states her toenails are long and painful.  Objective: Vital signs are stable alert oriented x3.  Pulses are palpable.  Neurologic sensorium is intact toenails are long thick yellow dystrophic onychomycotic no signs of cellulitis or infection.  Assessment: Well-healing cellulitic toes pain in limb secondary onychomycosis.  Plan: Debridement of toes 1 through 5 bilateral.

## 2018-03-07 DIAGNOSIS — G4733 Obstructive sleep apnea (adult) (pediatric): Secondary | ICD-10-CM | POA: Diagnosis not present

## 2018-03-10 DIAGNOSIS — J330 Polyp of nasal cavity: Secondary | ICD-10-CM | POA: Diagnosis not present

## 2018-03-10 DIAGNOSIS — H6120 Impacted cerumen, unspecified ear: Secondary | ICD-10-CM | POA: Diagnosis not present

## 2018-03-10 DIAGNOSIS — G4733 Obstructive sleep apnea (adult) (pediatric): Secondary | ICD-10-CM | POA: Diagnosis not present

## 2018-03-31 ENCOUNTER — Other Ambulatory Visit: Payer: Self-pay | Admitting: Family Medicine

## 2018-03-31 DIAGNOSIS — Z1231 Encounter for screening mammogram for malignant neoplasm of breast: Secondary | ICD-10-CM

## 2018-04-08 DIAGNOSIS — G4733 Obstructive sleep apnea (adult) (pediatric): Secondary | ICD-10-CM | POA: Diagnosis not present

## 2018-04-15 DIAGNOSIS — J309 Allergic rhinitis, unspecified: Secondary | ICD-10-CM | POA: Diagnosis not present

## 2018-04-15 DIAGNOSIS — E7800 Pure hypercholesterolemia, unspecified: Secondary | ICD-10-CM | POA: Diagnosis not present

## 2018-04-15 DIAGNOSIS — R42 Dizziness and giddiness: Secondary | ICD-10-CM | POA: Diagnosis not present

## 2018-04-15 DIAGNOSIS — G4700 Insomnia, unspecified: Secondary | ICD-10-CM | POA: Diagnosis not present

## 2018-04-15 DIAGNOSIS — I10 Essential (primary) hypertension: Secondary | ICD-10-CM | POA: Diagnosis not present

## 2018-05-14 DIAGNOSIS — N3281 Overactive bladder: Secondary | ICD-10-CM | POA: Diagnosis not present

## 2018-05-14 DIAGNOSIS — R001 Bradycardia, unspecified: Secondary | ICD-10-CM | POA: Diagnosis not present

## 2018-05-20 DIAGNOSIS — G4733 Obstructive sleep apnea (adult) (pediatric): Secondary | ICD-10-CM | POA: Diagnosis not present

## 2018-05-28 DIAGNOSIS — R42 Dizziness and giddiness: Secondary | ICD-10-CM | POA: Diagnosis not present

## 2018-05-28 DIAGNOSIS — I10 Essential (primary) hypertension: Secondary | ICD-10-CM | POA: Diagnosis not present

## 2018-05-28 DIAGNOSIS — N390 Urinary tract infection, site not specified: Secondary | ICD-10-CM | POA: Diagnosis not present

## 2018-05-28 DIAGNOSIS — J309 Allergic rhinitis, unspecified: Secondary | ICD-10-CM | POA: Diagnosis not present

## 2018-06-04 ENCOUNTER — Ambulatory Visit: Payer: PPO | Admitting: Podiatry

## 2018-06-09 ENCOUNTER — Ambulatory Visit: Payer: PPO | Admitting: Podiatry

## 2018-06-09 ENCOUNTER — Encounter: Payer: Self-pay | Admitting: Podiatry

## 2018-06-09 ENCOUNTER — Other Ambulatory Visit: Payer: Self-pay

## 2018-06-09 VITALS — Temp 97.8°F

## 2018-06-09 DIAGNOSIS — B351 Tinea unguium: Secondary | ICD-10-CM

## 2018-06-09 DIAGNOSIS — M79676 Pain in unspecified toe(s): Secondary | ICD-10-CM | POA: Diagnosis not present

## 2018-06-09 NOTE — Progress Notes (Signed)
She presents today chief complaint of painful elongated toenails 1 through 5 bilaterally.  Objective: Pulses remain palpable no open lesions or wounds.  Toenails are thick yellow dystrophic-like mycotic sharply incurvated painful debrided.  Assessment: Pain in limb secondary to onychomycosis.  Plan: Debridement of toenails 1 through 5 bilateral.

## 2018-07-07 DIAGNOSIS — G4733 Obstructive sleep apnea (adult) (pediatric): Secondary | ICD-10-CM | POA: Diagnosis not present

## 2018-07-09 DIAGNOSIS — G4733 Obstructive sleep apnea (adult) (pediatric): Secondary | ICD-10-CM | POA: Diagnosis not present

## 2018-07-09 DIAGNOSIS — H6120 Impacted cerumen, unspecified ear: Secondary | ICD-10-CM | POA: Diagnosis not present

## 2018-07-09 DIAGNOSIS — J330 Polyp of nasal cavity: Secondary | ICD-10-CM | POA: Diagnosis not present

## 2018-07-21 ENCOUNTER — Other Ambulatory Visit: Payer: Self-pay

## 2018-07-21 ENCOUNTER — Ambulatory Visit
Admission: RE | Admit: 2018-07-21 | Discharge: 2018-07-21 | Disposition: A | Discharge status: Home / Self Care | Payer: PPO | Source: Ambulatory Visit | Attending: Family Medicine | Admitting: Family Medicine

## 2018-07-21 DIAGNOSIS — Z1231 Encounter for screening mammogram for malignant neoplasm of breast: Secondary | ICD-10-CM | POA: Diagnosis not present

## 2018-07-24 DIAGNOSIS — J309 Allergic rhinitis, unspecified: Secondary | ICD-10-CM | POA: Diagnosis not present

## 2018-07-24 DIAGNOSIS — N3281 Overactive bladder: Secondary | ICD-10-CM | POA: Diagnosis not present

## 2018-07-24 DIAGNOSIS — R42 Dizziness and giddiness: Secondary | ICD-10-CM | POA: Diagnosis not present

## 2018-07-24 DIAGNOSIS — I10 Essential (primary) hypertension: Secondary | ICD-10-CM | POA: Diagnosis not present

## 2018-07-24 DIAGNOSIS — G4700 Insomnia, unspecified: Secondary | ICD-10-CM | POA: Diagnosis not present

## 2018-08-15 ENCOUNTER — Ambulatory Visit (INDEPENDENT_AMBULATORY_CARE_PROVIDER_SITE_OTHER): Payer: PPO | Admitting: Urology

## 2018-08-15 ENCOUNTER — Other Ambulatory Visit: Payer: Self-pay

## 2018-08-15 ENCOUNTER — Other Ambulatory Visit
Admission: RE | Admit: 2018-08-15 | Discharge: 2018-08-15 | Disposition: A | Discharge status: Home / Self Care | Payer: PPO | Attending: Urology | Admitting: Urology

## 2018-08-15 ENCOUNTER — Encounter: Payer: Self-pay | Admitting: Urology

## 2018-08-15 ENCOUNTER — Encounter

## 2018-08-15 VITALS — BP 142/79 | HR 64 | Ht 62.0 in | Wt 122.0 lb

## 2018-08-15 DIAGNOSIS — R350 Frequency of micturition: Secondary | ICD-10-CM | POA: Diagnosis not present

## 2018-08-15 DIAGNOSIS — N8111 Cystocele, midline: Secondary | ICD-10-CM

## 2018-08-15 DIAGNOSIS — N362 Urethral caruncle: Secondary | ICD-10-CM

## 2018-08-15 DIAGNOSIS — R339 Retention of urine, unspecified: Secondary | ICD-10-CM | POA: Diagnosis not present

## 2018-08-15 DIAGNOSIS — N3941 Urge incontinence: Secondary | ICD-10-CM | POA: Diagnosis not present

## 2018-08-15 LAB — URINALYSIS, COMPLETE (UACMP) WITH MICROSCOPIC
Bilirubin Urine: NEGATIVE
Glucose, UA: NEGATIVE mg/dL
Ketones, ur: NEGATIVE mg/dL
Nitrite: NEGATIVE
Protein, ur: NEGATIVE mg/dL
Specific Gravity, Urine: 1.01 (ref 1.005–1.030)
Squamous Epithelial / LPF: NONE SEEN (ref 0–5)
pH: 6.5 (ref 5.0–8.0)

## 2018-08-15 LAB — BLADDER SCAN AMB NON-IMAGING

## 2018-08-15 MED ORDER — PHENAZOPYRIDINE HCL 200 MG PO TABS: 200.0000 mg | ORAL_TABLET | Freq: Three times a day (TID) | ORAL | 0 refills | Status: DC | PRN

## 2018-08-15 NOTE — Progress Notes (Signed)
08/15/2018 4:49 PM   Lori Wiggins September 24, 1928 [id number]  Referring provider: Lorelee Market, [place],  [place] [address]  Chief Complaint  Patient presents with  . Urinary Frequency    New Patient    HPI: 85 year old female who presents today for further evaluation of severe urinary symptoms.  She reports that over the past several years, she had increasing difficulty getting to the bathroom on time.  She had several episodes where she will feel the urge to urinate and have large volume incontinence.  She knows diapers daily and is very bothersome to her.  Her symptoms have acutely worsened over the past year.  She has minimal leakage with laughing coughing and sneezing.  No exacerbating or alleviating symptoms.  She tried Myrbetriq 50 mg without any improvement.  She then tried a larger dose of Detrol which was ineffective.  Higher dose of Detrol is mildly helpful and decreases the amount of accidents per day, however she feels overall fatigued and unwell with this medication.  She only takes it about every other day due to the side effects.  She denies any significant daytime frequency.  She gets up several times at night to void.  No issues with recurrent urinary tract infections.  She denies any dysuria today.  She has have a history of chronic constipation and drinks prune juice and eats prunes daily to avoid having hard bowel movements.  She is a fairly regular with this regimen.  She has a history of hysterectomy for uterine fibroids in the 70s.  She is not sexually active.  She does note today that she has bulging in her vagina.  She noticed this in the shower.  Is not otherwise bothersome to her.  She does feel like she empties her bladder for the most part.  PVR 221 cc   PMH: Past Medical History:  Diagnosis Date  . Arthritis   . Breast cancer ([place]) 2012   left  breast  . GERD (gastroesophageal reflux disease)   . Hypertension   .  Hypothyroidism   . Personal history of radiation therapy   . Sleep apnea    uses CPAP    Surgical History: Past Surgical History:  Procedure Laterality Date  . ABDOMINAL HYSTERECTOMY    . APPENDECTOMY    . BREAST BIOPSY Left 2012   Positive  . BREAST EXCISIONAL BIOPSY Right    at age 89's  . BREAST LUMPECTOMY Left 2012   F/U radiation   . BREAST LUMPECTOMY WITH AXILLARY LYMPH NODE BIOPSY Left 2012   with Radiation  . BREAST SURGERY Left 2012   Lumpectomy  . CARDIAC CATHETERIZATION     10 years ago  . TONSILLECTOMY    . TOTAL KNEE ARTHROPLASTY Right 07/21/2012   Dr Mayer Camel  . TOTAL KNEE ARTHROPLASTY Left 07/21/2012   Procedure: TOTAL KNEE ARTHROPLASTY;  Surgeon: Kerin Salen, MD;  Location: [facility];  Service: Orthopedics;  Laterality: Left;    Home Medications:  Allergies as of 08/15/2018      Reactions   Sulfa Antibiotics Other (See Comments)   Unknown Unknown   Nsaids Rash, Other (See Comments)   Pt. does not want to take at all. Pt. does not want to take at all.      Medication List       Accurate as of August 15, 2018 11:59 PM. If you have any questions, ask your nurse or doctor.        STOP taking  these medications   amoxicillin 500 MG capsule Commonly known as: AMOXIL Stopped by: Hollice Espy, MD   doxycycline 100 MG tablet Commonly known as: VIBRA-TABS Stopped by: Hollice Espy, MD     TAKE these medications   amLODipine 2.5 MG tablet Commonly known as: NORVASC Take 2.5 mg by mouth 2 (two) times daily.   aspirin 325 MG tablet Take by mouth.   azelastine 0.1 % nasal spray Commonly known as: ASTELIN Place 1 spray into both nostrils 2 (two) times daily as needed for rhinitis or allergies.   Azelastine HCl 0.15 % Soln   CALCIUM PO Take 1 tablet by mouth daily.   Co Q-10 100 MG Caps Take 100 mg by mouth daily.   FISH OIL PO Take 1 capsule by mouth daily.   fluocinonide cream 0.05 % Commonly known as: LIDEX Apply 1 application topically  as needed (irritation).   fluticasone 50 MCG/ACT nasal spray Commonly known as: FLONASE   gentamicin ointment 0.1 % Commonly known as: GARAMYCIN Apply 1 application topically 3 (three) times daily. Apply to bottom of nose for CPAP mask   meclizine 25 MG tablet Commonly known as: ANTIVERT   metoprolol succinate 25 MG 24 hr tablet Commonly known as: TOPROL-XL Take 25 mg by mouth daily.   Myrbetriq 25 MG Tb24 tablet Generic drug: mirabegron ER   tolterodine 2 MG 24 hr capsule Commonly known as: DETROL LA Take 2 mg by mouth daily as needed (bladder relaxant).   VESIcare 10 MG tablet Generic drug: solifenacin   Vitamin D3 125 MCG (5000 UT) Tabs Take 5,000 Units by mouth daily.       Allergies:  Allergies  Allergen Reactions  . Sulfa Antibiotics Other (See Comments)    Unknown Unknown    . Nsaids Rash and Other (See Comments)    Pt. does not want to take at all. Pt. does not want to take at all.    Family History: Family History  Problem Relation Age of Onset  . Breast cancer Neg Hx     Social History:  reports that she has never smoked. She has never used smokeless tobacco. She reports current alcohol use. She reports that she does not use drugs.  ROS: UROLOGY Frequent Urination?: No Hard to postpone urination?: No Burning/pain with urination?: No Get up at night to urinate?: No Leakage of urine?: No Urine stream starts and stops?: No Trouble starting stream?: No Do you have to strain to urinate?: No Blood in urine?: No Urinary tract infection?: No Sexually transmitted disease?: No Injury to kidneys or bladder?: No Painful intercourse?: No Weak stream?: No Currently pregnant?: No Vaginal bleeding?: No Last menstrual period?: n  Gastrointestinal Nausea?: No Vomiting?: No Indigestion/heartburn?: No Diarrhea?: No Constipation?: No  Constitutional Fever: No Night sweats?: No Weight loss?: No Fatigue?: No  Skin Skin rash/lesions?: No  Itching?: No  Eyes Blurred vision?: No Double vision?: No  Ears/Nose/Throat Sore throat?: No Sinus problems?: No  Hematologic/Lymphatic Swollen glands?: No Easy bruising?: No  Cardiovascular Leg swelling?: No Chest pain?: No  Respiratory Cough?: No Shortness of breath?: No  Endocrine Excessive thirst?: No  Musculoskeletal Back pain?: No Joint pain?: No  Neurological Headaches?: No Dizziness?: No  Psychologic Depression?: No Anxiety?: No  Physical Exam: BP (!) 142/79   Pulse 64   Ht 5\' 2"  (1.575 m)   Wt 122 lb (55.3 kg)   BMI 22.31 kg/m   Constitutional:  Alert and oriented, No acute distress. HEENT: [place] AT, moist mucus  membranes.  Trachea midline, no masses. Cardiovascular: No clubbing, cyanosis, or edema. Respiratory: Normal respiratory effort, no increased work of breathing. GI: Abdomen is soft, nontender, nondistended, no abdominal masses Pelvic: Chaperoned by CMA Fonnie Jarvis.  Normal external genitalia.  Small urethral caruncle at the 6 clock position appreciated.  Stage II cystocele to vaginal introitus with Valsalva.  Small rectocele with some apical descent as well.  Atrophy appreciated diffusely. Skin: No rashes, bruises or suspicious lesions. Neurologic: Grossly intact, no focal deficits, moving all 4 extremities. Psychiatric: Normal mood and affect.  Laboratory Data: Lab Results  Component Value Date   WBC 8.5 01/17/2017   HGB 13.1 01/17/2017   HCT 40.3 01/17/2017   MCV 86.9 01/17/2017   PLT 175 01/17/2017    Lab Results  Component Value Date   CREATININE 0.78 01/17/2017   Urinalysis    Component Value Date/Time   COLORURINE YELLOW 08/15/2018 1541   APPEARANCEUR CLEAR 08/15/2018 1541   LABSPEC 1.010 08/15/2018 1541   PHURINE 6.5 08/15/2018 1541   GLUCOSEU NEGATIVE 08/15/2018 1541   HGBUR TRACE (A) 08/15/2018 1541   BILIRUBINUR NEGATIVE 08/15/2018 1541   KETONESUR NEGATIVE 08/15/2018 1541   PROTEINUR NEGATIVE 08/15/2018 1541    UROBILINOGEN 0.2 07/16/2012 0953   NITRITE NEGATIVE 08/15/2018 1541   LEUKOCYTESUR SMALL (A) 08/15/2018 1541    Lab Results  Component Value Date   BACTERIA FEW (A) 08/15/2018    Results for orders placed or performed in visit on 08/15/18  BLADDER SCAN AMB NON-IMAGING  Result Value Ref Range   Scan Result 278ml     Assessment & Plan:    1. Urge incontinence Urinary urgency/urge incontinence refractory to beta 3 agonist Discussed behavioral modification Given her age and side effects from anticholinergics, do not feel that anticholinergics are considered a reasonable option due to concern for falls, confusion, mental status changes amongst others We discussed options for refractory OAB/urge incontinence including possibility of PTNS and Botox Each of these were discussed in length and she was given literature as well as watching videos discussing each of these options, the procedures themselves, outcomes, and possible side effects She may be interested in Botox and she will think about it.  Should give [place] call essentially to proceed.  She understands the risk of urinary retention with Botox proximally 5 to 10%.  She would need to learn how to self cath if she like to pursue this.  She will get back to [place] if she would like to pursue either of these interventions - BLADDER SCAN AMB NON-IMAGING - Urinalysis, Complete w Microscopic; Future - Urine culture  2. Urethral caruncle Asymptomatic, would recommend topical estrogen if she develops dysuria recurrent urinary tract infections in the future  3. Cystocele, midline Asymptomatic from this  Given her age, she is not an optimal surgical candidate however could consider pessary if this becomes bothersome to her in the future   4. Incomplete bladder emptying Mildly elevated PVR today, likely related to cystocele She was able to void again prior to pelvic exam thus I suspect she is likely voiding better than demonstrated on bladder  scan today Recommend timed and double void  Hollice Espy, MD  [facility] [address], [place], [place] [address] [phone]  I spent 45 min with this patient of which greater than 50% was spent in counseling and coordination of care with the patient.

## 2018-08-18 ENCOUNTER — Telehealth: Payer: Self-pay

## 2018-08-18 LAB — URINE CULTURE: Culture: >=100000 — AB

## 2018-08-18 MED ORDER — CEPHALEXIN 500 MG PO CAPS: 500.0000 mg | ORAL_CAPSULE | Freq: Four times a day (QID) | ORAL | 0 refills | Status: AC

## 2018-08-18 NOTE — Telephone Encounter (Signed)
Called pt, informed her of the information below. Pt gave verbal understanding. RX sent in.

## 2018-08-18 NOTE — Telephone Encounter (Signed)
-----   Message from Hollice Espy, MD sent at 08/18/2018  3:36 PM EDT ----- Please let this patient know that she did grow E. coli.  This may be contributing to her urinary symptoms.  Lets try to treat her with Keflex 500 mg 4 times a day for a week.  Hollice Espy, MD

## 2018-08-19 ENCOUNTER — Ambulatory Visit: Payer: PPO | Admitting: Urology

## 2018-09-01 ENCOUNTER — Encounter: Payer: Self-pay | Admitting: Podiatry

## 2018-09-01 ENCOUNTER — Ambulatory Visit: Payer: PPO | Admitting: Podiatry

## 2018-09-01 ENCOUNTER — Telehealth: Payer: Self-pay | Admitting: Urology

## 2018-09-01 ENCOUNTER — Other Ambulatory Visit: Payer: Self-pay

## 2018-09-01 DIAGNOSIS — M79676 Pain in unspecified toe(s): Secondary | ICD-10-CM

## 2018-09-01 DIAGNOSIS — B351 Tinea unguium: Secondary | ICD-10-CM | POA: Diagnosis not present

## 2018-09-01 NOTE — Telephone Encounter (Signed)
Left VM to return call 

## 2018-09-01 NOTE — Progress Notes (Signed)
She presents today chief complaint of painfully elongated toenails.  Objective: Toenails are long thick yellow dystrophic like mycotic.  Assessment: Pain limp secondary onychomycosis.  Plan: Debridement of toenails 1 through 5 bilateral.

## 2018-09-01 NOTE — Telephone Encounter (Signed)
Pt wants to discuss her swollen feet with a clinical person, she's very concerned about her kidney. Please advise @ cell # [phone] (pt is at the [facility] at [place])

## 2018-09-02 NOTE — Telephone Encounter (Signed)
Spoke to patient and advised her to contact her PCP

## 2018-09-03 ENCOUNTER — Ambulatory Visit: Payer: Self-pay | Admitting: Urology

## 2018-09-09 ENCOUNTER — Other Ambulatory Visit: Payer: Self-pay

## 2018-09-09 ENCOUNTER — Ambulatory Visit (INDEPENDENT_AMBULATORY_CARE_PROVIDER_SITE_OTHER): Payer: PPO | Admitting: Urology

## 2018-09-09 ENCOUNTER — Encounter: Payer: Self-pay | Admitting: Urology

## 2018-09-09 VITALS — BP 190/75 | HR 70 | Ht 62.0 in | Wt 124.0 lb

## 2018-09-09 DIAGNOSIS — R339 Retention of urine, unspecified: Secondary | ICD-10-CM | POA: Diagnosis not present

## 2018-09-09 DIAGNOSIS — N3941 Urge incontinence: Secondary | ICD-10-CM

## 2018-09-09 NOTE — Progress Notes (Signed)
Continuous Intermittent Catheterization   Patient is getting scheduled today for Botox, increase of urinary retention post Botox of self I & O Catheterization was taught. Patient was given detailed verbal and printed instructions of self catheterization. Patient was cleaned and prepped in a sterile fashion.  With instruction and assistance patient inserted a 14FR and urine return was noted 20 ml, urine was clear yellow in color. Patient tolerated well, no complications were noted Patient was given a sample bag with supplies to take home.  Instructions were given for patient to cath only for emergency after Botox  Preformed by: Fonnie Jarvis, CMA  After procedure was finished patient was assisted off the table and was helped to a chair. When patient attempted to sit on chair she became dizzy and started to fall. She was caught by myself and lowered to the floor. I called for help and Daryel Gerald, CMA came to the room and assisted with helping patient to her feet. She was examined and no injury was noted. Patient states she is fine and she did not hit any extremities. She states she has dizzy spells often and problems with her equilibrium. She declined evaluation at the ER. A wheel chair was brought in to take her to check out and then her car.

## 2018-09-09 NOTE — Patient Instructions (Signed)
   Step 1 Get all of your supplies ready and place near you. Step 2 Wash your hands or put on gloves. Step 3 Wash around urethral opening with warm antibacterial/ hypoallergenic soapy water from front to back. Step 4 take the catheter out of the package and drain the lubricant over toilet. Step 5 Sit on the toilet and spread your legs to begin catheterization. Step 6 Use your fingers to spread the labia and feel for the urethra.             A MIRROR MAY BE HELPFUL AT FIRST Step 7 Insert the catheter slowly into the urethra. If there is resistance when the catheter reaches the the sphincter muscle,              take a deep breath and gently apply steady pressure.            DO NOT FORCE THE CATHETER Step 8 When the urine begins to flow insert another inch, allow the urine to flow into the toilet. Step 9 When the flow of urine stops, slowly remove the catheter.  

## 2018-09-10 ENCOUNTER — Other Ambulatory Visit: Payer: Self-pay | Admitting: Radiology

## 2018-09-10 ENCOUNTER — Telehealth: Payer: Self-pay | Admitting: Radiology

## 2018-09-10 DIAGNOSIS — N3941 Urge incontinence: Secondary | ICD-10-CM

## 2018-09-10 DIAGNOSIS — N3281 Overactive bladder: Secondary | ICD-10-CM

## 2018-09-10 DIAGNOSIS — Z01818 Encounter for other preprocedural examination: Secondary | ICD-10-CM

## 2018-09-10 MED ORDER — ONABOTULINUMTOXINA 100 UNITS IJ SOLR: 100.0000 [IU] | INTRAMUSCULAR | Status: AC

## 2018-09-10 NOTE — H&P (View-Only) (Signed)
09/09/2018 2:02 PM   Lori Wiggins Aug 20, 1928 [id number]  Referring provider: Lorelee Market, [place],  [place] [address]  Chief Complaint  Patient presents with  . Follow-up    Discuss botox    HPI: 85 year old female with severe urinary urgency frequency and urge incontinence returns today to discuss treatment options.  Please see previous note from 8/14 for details.  She is failed multiple medications with persistent refractory symptoms.  In the interim, she has thought long and hard about how she like to proceed.  She is most interested in Botox today.  She has numerous questions about this option which was discussed at length.  Primarily, she reports that she has new twin grandkids.  She was billed to run into his after them and not worry about incontinence.   PMH: Past Medical History:  Diagnosis Date  . Arthritis   . Breast cancer ([place]) 2012   left  breast  . GERD (gastroesophageal reflux disease)   . Hypertension   . Hypothyroidism   . Personal history of radiation therapy   . Sleep apnea    uses CPAP    Surgical History: Past Surgical History:  Procedure Laterality Date  . ABDOMINAL HYSTERECTOMY    . APPENDECTOMY    . BREAST BIOPSY Left 2012   Positive  . BREAST EXCISIONAL BIOPSY Right    at age 27's  . BREAST LUMPECTOMY Left 2012   F/U radiation   . BREAST LUMPECTOMY WITH AXILLARY LYMPH NODE BIOPSY Left 2012   with Radiation  . BREAST SURGERY Left 2012   Lumpectomy  . CARDIAC CATHETERIZATION     10 years ago  . TONSILLECTOMY    . TOTAL KNEE ARTHROPLASTY Right 07/21/2012   Dr Mayer Camel  . TOTAL KNEE ARTHROPLASTY Left 07/21/2012   Procedure: TOTAL KNEE ARTHROPLASTY;  Surgeon: Kerin Salen, MD;  Location: [facility];  Service: Orthopedics;  Laterality: Left;    Home Medications:  Allergies as of 09/09/2018      Reactions   Sulfa Antibiotics Other (See Comments)   Unknown Unknown   Nsaids Rash, Other (See Comments)   Pt. does not  want to take at all. Pt. does not want to take at all.      Medication List       Accurate as of September 09, 2018 11:59 PM. If you have any questions, ask your nurse or doctor.        amLODipine 2.5 MG tablet Commonly known as: NORVASC Take 2.5 mg by mouth 2 (two) times daily.   aspirin 325 MG tablet Take by mouth.   azelastine 0.1 % nasal spray Commonly known as: ASTELIN Place 1 spray into both nostrils 2 (two) times daily as needed for rhinitis or allergies.   Azelastine HCl 0.15 % Soln   CALCIUM PO Take 1 tablet by mouth daily.   Co Q-10 100 MG Caps Take 100 mg by mouth daily.   FISH OIL PO Take 1 capsule by mouth daily.   fluocinonide cream 0.05 % Commonly known as: LIDEX Apply 1 application topically as needed (irritation).   fluticasone 50 MCG/ACT nasal spray Commonly known as: FLONASE   gentamicin ointment 0.1 % Commonly known as: GARAMYCIN Apply 1 application topically 3 (three) times daily. Apply to bottom of nose for CPAP mask   meclizine 25 MG tablet Commonly known as: ANTIVERT   metoprolol succinate 25 MG 24 hr tablet Commonly known as: TOPROL-XL Take 25 mg by mouth daily.  Myrbetriq 25 MG Tb24 tablet Generic drug: mirabegron ER   tolterodine 2 MG 24 hr capsule Commonly known as: DETROL LA Take 2 mg by mouth daily as needed (bladder relaxant).   VESIcare 10 MG tablet Generic drug: solifenacin   Vitamin D3 125 MCG (5000 UT) Tabs Take 5,000 Units by mouth daily.       Allergies:  Allergies  Allergen Reactions  . Sulfa Antibiotics Other (See Comments)    Unknown Unknown    . Nsaids Rash and Other (See Comments)    Pt. does not want to take at all. Pt. does not want to take at all.    Family History: Family History  Problem Relation Age of Onset  . Breast cancer Neg Hx     Social History:  reports that she has never smoked. She has never used smokeless tobacco. She reports current alcohol use. She reports that she does  not use drugs.  ROS: UROLOGY Frequent Urination?: Yes Hard to postpone urination?: No Burning/pain with urination?: No Get up at night to urinate?: Yes Leakage of urine?: Yes Urine stream starts and stops?: No Trouble starting stream?: No Do you have to strain to urinate?: No Blood in urine?: No Urinary tract infection?: No Sexually transmitted disease?: No Injury to kidneys or bladder?: No Painful intercourse?: No Weak stream?: No Currently pregnant?: No Vaginal bleeding?: No Last menstrual period?: n  Gastrointestinal Nausea?: No Vomiting?: No Indigestion/heartburn?: No Diarrhea?: No Constipation?: No  Constitutional Fever: No Night sweats?: No Weight loss?: No Fatigue?: No  Skin Skin rash/lesions?: No Itching?: No  Eyes Blurred vision?: No Double vision?: No  Ears/Nose/Throat Sore throat?: No Sinus problems?: No  Hematologic/Lymphatic Swollen glands?: No Easy bruising?: No  Cardiovascular Leg swelling?: No Chest pain?: No  Respiratory Cough?: No Shortness of breath?: No  Endocrine Excessive thirst?: No  Musculoskeletal Back pain?: No Joint pain?: No  Neurological Headaches?: No Dizziness?: No  Psychologic Depression?: No Anxiety?: No  Physical Exam: BP (!) 190/75   Pulse 70   Ht 5\' 2"  (1.575 m)   Wt 124 lb (56.2 kg)   BMI 22.68 kg/m   Constitutional:  Alert and oriented, No acute distress. HEENT: [place] AT, moist mucus membranes.  Trachea midline, no masses. Cardiovascular: No clubbing, cyanosis, or edema. Respiratory: Normal respiratory effort, no increased work of breathing. Skin: No rashes, bruises or suspicious lesions. Neurologic: Grossly intact, no focal deficits, moving all 4 extremities. Psychiatric: Normal mood and affect.  Laboratory Data: Lab Results  Component Value Date   WBC 8.5 01/17/2017   HGB 13.1 01/17/2017   HCT 40.3 01/17/2017   MCV 86.9 01/17/2017   PLT 175 01/17/2017    Lab Results  Component  Value Date   CREATININE 0.78 01/17/2017    Assessment & Plan:    1. Urge incontinence Again discussed options for refractory OAB including botox and PTNS.  As per previous discussions, she is interested in pursuing Botox.  Risk and benefits are reviewed again in detail.  She understands the risk of urinary retention approximately 5% of baseline, given the presence versus distended, bladder emptying, she may be at higher risk.  She understands this.  She was taught and able to demonstrate how to CIC today in case she needs to do this postoperatively.  I recommended having the first Botox treatment in the operating room and if she does well, subsequent treatments in the office.  All questions answered.  2. Incomplete bladder emptying Mildly elevated PVR last visit, however she  was able to void again prior to pelvic exam thus I do believe that she empties with timed voiding   Hollice Espy, MD  [facility] [address], [place], [place] [address] [phone]

## 2018-09-10 NOTE — Telephone Encounter (Signed)
Patient was given the [facility] Information form below as well as the Instructions for Pre-Admission Testing form & a map of [facility].    [place], Lori Wiggins, [place] [address] Telephone: [phone] Fax: [phone]   Thank you for choosing [facility] for your upcoming surgery!  We are always here to assist in your urological needs.  Please read the following information with specific details for your upcoming appointments related to your surgery. Please contact Jaclyn Carew at [phone] Option 3 with any questions.  The Name of Your Surgery: Bladder Botox injections Your Surgery Date: 09/22/2018 Your Surgeon: Hollice Espy  Please call Same Day Surgery at [phone] between the hours of 1pm-3pm one day prior to your surgery. They will inform you of the time to arrive at Same Day Surgery which is located on the second floor of the [facility].   Please refer to the attached letter regarding instructions for Pre-Admission Testing. You will receive a call from the [facility] office regarding your appointment with them.  The Pre-Admission Testing office is located at [place], on the first floor of the [facility] in [place] (office is to the right as you enter through the [employer] of the [facility]). Please have all medications you are currently taking and your insurance card available.  A COVID-19 test will be required prior to surgery and once test is performed you will need to remain in quarantine until the day of surgery. Patient was advised to have nothing to eat or drink after midnight the night prior to surgery except that she may have only water until 2 hours before surgery with nothing to drink within 2 hours of surgery.  The patient currently takes aspirin 81mg  & was informed to hold  medication for 7 days prior to surgery beginning on 09/15/2018. Patient's questions were answered and she expressed understanding of these instructions.

## 2018-09-10 NOTE — Progress Notes (Signed)
09/09/2018 2:02 PM   Lori Wiggins 09-25-1928 [id number]  Referring provider: Lorelee Market, [place],  [place] [address]  Chief Complaint  Patient presents with  . Follow-up    Discuss botox    HPI: 85 year old female with severe urinary urgency frequency and urge incontinence returns today to discuss treatment options.  Please see previous note from 8/14 for details.  She is failed multiple medications with persistent refractory symptoms.  In the interim, she has thought long and hard about how she like to proceed.  She is most interested in Botox today.  She has numerous questions about this option which was discussed at length.  Primarily, she reports that she has new twin grandkids.  She was billed to run into his after them and not worry about incontinence.   PMH: Past Medical History:  Diagnosis Date  . Arthritis   . Breast cancer ([place]) 2012   left  breast  . GERD (gastroesophageal reflux disease)   . Hypertension   . Hypothyroidism   . Personal history of radiation therapy   . Sleep apnea    uses CPAP    Surgical History: Past Surgical History:  Procedure Laterality Date  . ABDOMINAL HYSTERECTOMY    . APPENDECTOMY    . BREAST BIOPSY Left 2012   Positive  . BREAST EXCISIONAL BIOPSY Right    at age 74's  . BREAST LUMPECTOMY Left 2012   F/U radiation   . BREAST LUMPECTOMY WITH AXILLARY LYMPH NODE BIOPSY Left 2012   with Radiation  . BREAST SURGERY Left 2012   Lumpectomy  . CARDIAC CATHETERIZATION     10 years ago  . TONSILLECTOMY    . TOTAL KNEE ARTHROPLASTY Right 07/21/2012   Dr Mayer Camel  . TOTAL KNEE ARTHROPLASTY Left 07/21/2012   Procedure: TOTAL KNEE ARTHROPLASTY;  Surgeon: Kerin Salen, MD;  Location: [facility];  Service: Orthopedics;  Laterality: Left;    Home Medications:  Allergies as of 09/09/2018      Reactions   Sulfa Antibiotics Other (See Comments)   Unknown Unknown   Nsaids Rash, Other (See Comments)   Pt. does not  want to take at all. Pt. does not want to take at all.      Medication List       Accurate as of September 09, 2018 11:59 PM. If you have any questions, ask your nurse or doctor.        amLODipine 2.5 MG tablet Commonly known as: NORVASC Take 2.5 mg by mouth 2 (two) times daily.   aspirin 325 MG tablet Take by mouth.   azelastine 0.1 % nasal spray Commonly known as: ASTELIN Place 1 spray into both nostrils 2 (two) times daily as needed for rhinitis or allergies.   Azelastine HCl 0.15 % Soln   CALCIUM PO Take 1 tablet by mouth daily.   Co Q-10 100 MG Caps Take 100 mg by mouth daily.   FISH OIL PO Take 1 capsule by mouth daily.   fluocinonide cream 0.05 % Commonly known as: LIDEX Apply 1 application topically as needed (irritation).   fluticasone 50 MCG/ACT nasal spray Commonly known as: FLONASE   gentamicin ointment 0.1 % Commonly known as: GARAMYCIN Apply 1 application topically 3 (three) times daily. Apply to bottom of nose for CPAP mask   meclizine 25 MG tablet Commonly known as: ANTIVERT   metoprolol succinate 25 MG 24 hr tablet Commonly known as: TOPROL-XL Take 25 mg by mouth daily.  Myrbetriq 25 MG Tb24 tablet Generic drug: mirabegron ER   tolterodine 2 MG 24 hr capsule Commonly known as: DETROL LA Take 2 mg by mouth daily as needed (bladder relaxant).   VESIcare 10 MG tablet Generic drug: solifenacin   Vitamin D3 125 MCG (5000 UT) Tabs Take 5,000 Units by mouth daily.       Allergies:  Allergies  Allergen Reactions  . Sulfa Antibiotics Other (See Comments)    Unknown Unknown    . Nsaids Rash and Other (See Comments)    Pt. does not want to take at all. Pt. does not want to take at all.    Family History: Family History  Problem Relation Age of Onset  . Breast cancer Neg Hx     Social History:  reports that she has never smoked. She has never used smokeless tobacco. She reports current alcohol use. She reports that she does  not use drugs.  ROS: UROLOGY Frequent Urination?: Yes Hard to postpone urination?: No Burning/pain with urination?: No Get up at night to urinate?: Yes Leakage of urine?: Yes Urine stream starts and stops?: No Trouble starting stream?: No Do you have to strain to urinate?: No Blood in urine?: No Urinary tract infection?: No Sexually transmitted disease?: No Injury to kidneys or bladder?: No Painful intercourse?: No Weak stream?: No Currently pregnant?: No Vaginal bleeding?: No Last menstrual period?: n  Gastrointestinal Nausea?: No Vomiting?: No Indigestion/heartburn?: No Diarrhea?: No Constipation?: No  Constitutional Fever: No Night sweats?: No Weight loss?: No Fatigue?: No  Skin Skin rash/lesions?: No Itching?: No  Eyes Blurred vision?: No Double vision?: No  Ears/Nose/Throat Sore throat?: No Sinus problems?: No  Hematologic/Lymphatic Swollen glands?: No Easy bruising?: No  Cardiovascular Leg swelling?: No Chest pain?: No  Respiratory Cough?: No Shortness of breath?: No  Endocrine Excessive thirst?: No  Musculoskeletal Back pain?: No Joint pain?: No  Neurological Headaches?: No Dizziness?: No  Psychologic Depression?: No Anxiety?: No  Physical Exam: BP (!) 190/75   Pulse 70   Ht 5\' 2"  (1.575 m)   Wt 124 lb (56.2 kg)   BMI 22.68 kg/m   Constitutional:  Alert and oriented, No acute distress. HEENT: [place] AT, moist mucus membranes.  Trachea midline, no masses. Cardiovascular: No clubbing, cyanosis, or edema. Respiratory: Normal respiratory effort, no increased work of breathing. Skin: No rashes, bruises or suspicious lesions. Neurologic: Grossly intact, no focal deficits, moving all 4 extremities. Psychiatric: Normal mood and affect.  Laboratory Data: Lab Results  Component Value Date   WBC 8.5 01/17/2017   HGB 13.1 01/17/2017   HCT 40.3 01/17/2017   MCV 86.9 01/17/2017   PLT 175 01/17/2017    Lab Results  Component  Value Date   CREATININE 0.78 01/17/2017    Assessment & Plan:    1. Urge incontinence Again discussed options for refractory OAB including botox and PTNS.  As per previous discussions, she is interested in pursuing Botox.  Risk and benefits are reviewed again in detail.  She understands the risk of urinary retention approximately 5% of baseline, given the presence versus distended, bladder emptying, she may be at higher risk.  She understands this.  She was taught and able to demonstrate how to CIC today in case she needs to do this postoperatively.  I recommended having the first Botox treatment in the operating room and if she does well, subsequent treatments in the office.  All questions answered.  2. Incomplete bladder emptying Mildly elevated PVR last visit, however she  was able to void again prior to pelvic exam thus I do believe that she empties with timed voiding   Hollice Espy, MD  [facility] [address], [place], [place] [address] [phone]

## 2018-09-12 ENCOUNTER — Other Ambulatory Visit: Payer: PPO

## 2018-09-12 ENCOUNTER — Other Ambulatory Visit: Payer: Self-pay

## 2018-09-12 DIAGNOSIS — N3941 Urge incontinence: Secondary | ICD-10-CM | POA: Diagnosis not present

## 2018-09-12 DIAGNOSIS — N3281 Overactive bladder: Secondary | ICD-10-CM | POA: Diagnosis not present

## 2018-09-12 DIAGNOSIS — Z01818 Encounter for other preprocedural examination: Secondary | ICD-10-CM

## 2018-09-12 LAB — URINALYSIS, COMPLETE
Bilirubin, UA: NEGATIVE
Glucose, UA: NEGATIVE
Nitrite, UA: NEGATIVE
Specific Gravity, UA: 1.015 (ref 1.005–1.030)
Urobilinogen, Ur: 0.2 mg/dL (ref 0.2–1.0)
pH, UA: 7 (ref 5.0–7.5)

## 2018-09-12 LAB — MICROSCOPIC EXAMINATION: WBC, UA: >30 /hpf — AB (ref 0–5)

## 2018-09-17 ENCOUNTER — Telehealth: Payer: Self-pay

## 2018-09-17 DIAGNOSIS — A498 Other bacterial infections of unspecified site: Secondary | ICD-10-CM

## 2018-09-17 LAB — CULTURE, URINE COMPREHENSIVE

## 2018-09-17 MED ORDER — NITROFURANTOIN MONOHYD MACRO 100 MG PO CAPS: 100.0000 mg | ORAL_CAPSULE | Freq: Two times a day (BID) | ORAL | 0 refills | Status: AC

## 2018-09-17 NOTE — Telephone Encounter (Signed)
-----   Message from Hollice Espy, MD sent at 09/17/2018  4:03 PM EDT ----- This patient has a mild urinary tract infection.  She needs to be treated for this prior to surgery.  I recommend nitrofurantoin 100 mg twice daily for 10 days.  Hollice Espy, MD

## 2018-09-17 NOTE — Telephone Encounter (Signed)
Called pt informed her of the information below. Pt gave verbal understanding. She states that she "always" has this bacteria and is very verbally upset that we will need to postpone her procedure.

## 2018-09-18 ENCOUNTER — Other Ambulatory Visit: Payer: Self-pay

## 2018-09-18 ENCOUNTER — Encounter
Admission: RE | Admit: 2018-09-18 | Discharge: 2018-09-18 | Disposition: A | Discharge status: Home / Self Care | Payer: PPO | Source: Ambulatory Visit | Attending: Urology | Admitting: Urology

## 2018-09-18 DIAGNOSIS — I10 Essential (primary) hypertension: Secondary | ICD-10-CM | POA: Insufficient documentation

## 2018-09-18 DIAGNOSIS — Z20828 Contact with and (suspected) exposure to other viral communicable diseases: Secondary | ICD-10-CM | POA: Insufficient documentation

## 2018-09-18 DIAGNOSIS — Z01818 Encounter for other preprocedural examination: Secondary | ICD-10-CM | POA: Insufficient documentation

## 2018-09-18 DIAGNOSIS — R001 Bradycardia, unspecified: Secondary | ICD-10-CM | POA: Diagnosis not present

## 2018-09-18 HISTORY — DX: Dizziness and giddiness: R42

## 2018-09-18 LAB — CBC
HCT: 40.5 % (ref 36.0–46.0)
Hemoglobin: 13.4 g/dL (ref 12.0–15.0)
MCH: 28.5 pg (ref 26.0–34.0)
MCHC: 33.1 g/dL (ref 30.0–36.0)
MCV: 86.2 fL (ref 80.0–100.0)
Platelets: 189 10*3/uL (ref 150–400)
RBC: 4.7 MIL/uL (ref 3.87–5.11)
RDW: 14.8 % (ref 11.5–15.5)
WBC: 8.8 10*3/uL (ref 4.0–10.5)
nRBC: 0 % (ref 0.0–0.2)

## 2018-09-18 LAB — BASIC METABOLIC PANEL
Anion gap: 10 (ref 5–15)
BUN: 14 mg/dL (ref 8–23)
CO2: 26 mmol/L (ref 22–32)
Calcium: 9.4 mg/dL (ref 8.9–10.3)
Chloride: 98 mmol/L (ref 98–111)
Creatinine, Ser: 0.65 mg/dL (ref 0.44–1.00)
GFR calc Af Amer: >60 mL/min (ref >60)
GFR calc non Af Amer: >60 mL/min (ref >60)
Glucose, Bld: 100 mg/dL — ABNORMAL HIGH (ref 70–99)
Potassium: 3.9 mmol/L (ref 3.5–5.1)
Sodium: 134 mmol/L — ABNORMAL LOW (ref 135–145)

## 2018-09-18 NOTE — Telephone Encounter (Signed)
After clarifying with Dr Erlene Quan informed patient that surgery will continue as planned on 09/22/2018. She is to take the antibiotic as prescribed including the morning of surgery. Patient verbally expressed joy that surgery will not be postponed & understanding of instructions.

## 2018-09-18 NOTE — Patient Instructions (Signed)
Your procedure is scheduled on: Monday, September 21 Report to Day Surgery on the 2nd floor of the [facility]. To find out your arrival time, please call [phone] between 1PM - 3PM on: Friday, September 18  REMEMBER: Instructions that are not followed completely may result in serious medical risk, up to and including death; or upon the discretion of your surgeon and anesthesiologist your surgery may need to be rescheduled.  Do not eat food after midnight the night before surgery.  No gum chewing, lozengers or hard candies.  You may however, drink CLEAR liquids up to 2 hours before you are scheduled to arrive for your surgery. Do not drink anything within 2 hours of the start of your surgery.  Clear liquids include: - water  - apple juice without pulp - gatorade - black coffee or tea (Do NOT add milk or creamers to the coffee or tea) Do NOT drink anything that is not on this list.  No Alcohol for 24 hours before or after surgery.  No Smoking including e-cigarettes for 24 hours prior to surgery.  No chewable tobacco products for at least 6 hours prior to surgery.  No nicotine patches on the day of surgery.  On the morning of surgery brush your teeth with toothpaste and water, you may rinse your mouth with mouthwash if you wish. Do not swallow any toothpaste or mouthwash.  Notify your doctor if there is any change in your medical condition (cold, fever, infection).  Do not wear jewelry, make-up, hairpins, clips or nail polish.  Do not wear lotions, powders, or perfumes.   Do not shave 48 hours prior to surgery.   Contacts and dentures may not be worn into surgery.  Do not bring valuables to the hospital, including drivers license, insurance or credit cards.  [facility] is not responsible for any belongings or valuables.   TAKE THESE MEDICATIONS THE MORNING OF SURGERY:  1.  Amlodipine 2.  Metoprolol  Bring your C-PAP to the hospital with you in case you may have to  spend the night.   Now!  Stop aspirin and Anti-inflammatories (NSAIDS) such as Advil, Aleve, Ibuprofen, Motrin, Naproxen, Naprosyn and Aspirin based products such as Excedrin, Goodys Powder, BC Powder. (May take Tylenol or Acetaminophen if needed.)  Now!  Stop ANY OVER THE COUNTER supplements until after surgery. VIACTIV CALCIUM, CO-Q 10, MAGNESIUM ZINC, JOINT HEALTH (May continue Vitamin D)  Wear comfortable clothing (specific to your surgery type) to the hospital.  If you are being discharged the day of surgery, you will not be allowed to drive home. You will need a responsible adult to drive you home and stay with you that night.   If you are taking public transportation, you will need to have a responsible adult with you. Please confirm with your physician that it is acceptable to use public transportation.   Please call [phone] if you have any questions about these instructions.

## 2018-09-19 ENCOUNTER — Ambulatory Visit: Payer: PPO | Admitting: Urology

## 2018-09-19 LAB — SARS CORONAVIRUS 2 (TAT 6-24 HRS): SARS Coronavirus 2: NEGATIVE

## 2018-09-22 ENCOUNTER — Encounter: Payer: Self-pay | Admitting: *Deleted

## 2018-09-22 ENCOUNTER — Ambulatory Visit: Payer: PPO | Admitting: Certified Registered"

## 2018-09-22 ENCOUNTER — Encounter
Admission: RE | Disposition: A | Discharge status: Home / Self Care | Payer: Self-pay | Source: Home / Self Care | Attending: Urology

## 2018-09-22 ENCOUNTER — Ambulatory Visit
Admission: RE | Admit: 2018-09-22 | Discharge: 2018-09-22 | Disposition: A | Discharge status: Home / Self Care | Payer: PPO | Attending: Urology | Admitting: Urology

## 2018-09-22 ENCOUNTER — Other Ambulatory Visit: Payer: Self-pay

## 2018-09-22 DIAGNOSIS — E119 Type 2 diabetes mellitus without complications: Secondary | ICD-10-CM | POA: Insufficient documentation

## 2018-09-22 DIAGNOSIS — I10 Essential (primary) hypertension: Secondary | ICD-10-CM | POA: Diagnosis not present

## 2018-09-22 DIAGNOSIS — Z923 Personal history of irradiation: Secondary | ICD-10-CM | POA: Insufficient documentation

## 2018-09-22 DIAGNOSIS — K219 Gastro-esophageal reflux disease without esophagitis: Secondary | ICD-10-CM | POA: Diagnosis not present

## 2018-09-22 DIAGNOSIS — N3281 Overactive bladder: Secondary | ICD-10-CM | POA: Diagnosis not present

## 2018-09-22 DIAGNOSIS — Z79899 Other long term (current) drug therapy: Secondary | ICD-10-CM | POA: Insufficient documentation

## 2018-09-22 DIAGNOSIS — Z7982 Long term (current) use of aspirin: Secondary | ICD-10-CM | POA: Insufficient documentation

## 2018-09-22 DIAGNOSIS — G4730 Sleep apnea, unspecified: Secondary | ICD-10-CM | POA: Insufficient documentation

## 2018-09-22 DIAGNOSIS — Z96653 Presence of artificial knee joint, bilateral: Secondary | ICD-10-CM | POA: Insufficient documentation

## 2018-09-22 DIAGNOSIS — Z853 Personal history of malignant neoplasm of breast: Secondary | ICD-10-CM | POA: Insufficient documentation

## 2018-09-22 DIAGNOSIS — G4733 Obstructive sleep apnea (adult) (pediatric): Secondary | ICD-10-CM | POA: Diagnosis not present

## 2018-09-22 DIAGNOSIS — N3941 Urge incontinence: Secondary | ICD-10-CM

## 2018-09-22 HISTORY — PX: BOTOX INJECTION: SHX5754

## 2018-09-22 SURGERY — BOTOX INJECTION
Anesthesia: General

## 2018-09-22 MED ORDER — CEFAZOLIN SODIUM-DEXTROSE 1-4 GM/50ML-% IV SOLN
1.0000 g | INTRAVENOUS | Status: AC
  Administered 2018-09-22: 08:00:00 1 g via INTRAVENOUS

## 2018-09-22 MED ORDER — LACTATED RINGERS IV SOLN
INTRAVENOUS | Status: DC
  Administered 2018-09-22: 07:00:00 via INTRAVENOUS

## 2018-09-22 MED ORDER — PROPOFOL 10 MG/ML IV BOLUS
INTRAVENOUS | Status: AC
  Filled 2018-09-22: qty 20

## 2018-09-22 MED ORDER — ONABOTULINUMTOXINA 100 UNITS IJ SOLR
INTRAMUSCULAR | Status: DC | PRN
  Administered 2018-09-22: 100 [IU] via INTRAMUSCULAR

## 2018-09-22 MED ORDER — LIDOCAINE HCL (PF) 2 % IJ SOLN
INTRAMUSCULAR | Status: AC
  Filled 2018-09-22: qty 10

## 2018-09-22 MED ORDER — PROPOFOL 10 MG/ML IV BOLUS
INTRAVENOUS | Status: DC | PRN
  Administered 2018-09-22: 110 mg via INTRAVENOUS
  Administered 2018-09-22: 30 mg via INTRAVENOUS

## 2018-09-22 MED ORDER — FAMOTIDINE 20 MG PO TABS
ORAL_TABLET | ORAL | Status: AC
  Administered 2018-09-22: 07:00:00 20 mg via ORAL
  Filled 2018-09-22: qty 1

## 2018-09-22 MED ORDER — LIDOCAINE HCL (CARDIAC) PF 100 MG/5ML IV SOSY
PREFILLED_SYRINGE | INTRAVENOUS | Status: DC | PRN
  Administered 2018-09-22: 80 mg via INTRAVENOUS

## 2018-09-22 MED ORDER — SODIUM CHLORIDE (PF) 0.9 % IJ SOLN
INTRAMUSCULAR | Status: AC
  Filled 2018-09-22: qty 10

## 2018-09-22 MED ORDER — EPHEDRINE SULFATE 50 MG/ML IJ SOLN
INTRAMUSCULAR | Status: DC | PRN
  Administered 2018-09-22: 10 mg via INTRAVENOUS

## 2018-09-22 MED ORDER — FENTANYL CITRATE (PF) 100 MCG/2ML IJ SOLN: 25.0000 ug | INTRAMUSCULAR | Status: DC | PRN

## 2018-09-22 MED ORDER — FAMOTIDINE 20 MG PO TABS
20.0000 mg | ORAL_TABLET | Freq: Once | ORAL | Status: AC
  Administered 2018-09-22: 07:00:00 20 mg via ORAL

## 2018-09-22 MED ORDER — EPHEDRINE SULFATE 50 MG/ML IJ SOLN
INTRAMUSCULAR | Status: AC
  Filled 2018-09-22: qty 1

## 2018-09-22 MED ORDER — ONDANSETRON HCL 4 MG/2ML IJ SOLN
INTRAMUSCULAR | Status: AC
  Filled 2018-09-22: qty 2

## 2018-09-22 MED ORDER — CEFAZOLIN SODIUM-DEXTROSE 1-4 GM/50ML-% IV SOLN
INTRAVENOUS | Status: AC
  Filled 2018-09-22: qty 50

## 2018-09-22 MED ORDER — SODIUM CHLORIDE (PF) 0.9 % IJ SOLN
INTRAMUSCULAR | Status: DC | PRN
  Administered 2018-09-22: 10 mL

## 2018-09-22 MED ORDER — PHENYLEPHRINE HCL (PRESSORS) 10 MG/ML IV SOLN
INTRAVENOUS | Status: AC
  Filled 2018-09-22: qty 1

## 2018-09-22 MED ORDER — ONDANSETRON HCL 4 MG/2ML IJ SOLN: 4.0000 mg | Freq: Once | INTRAMUSCULAR | Status: DC | PRN

## 2018-09-22 MED ORDER — ONDANSETRON HCL 4 MG/2ML IJ SOLN
INTRAMUSCULAR | Status: DC | PRN
  Administered 2018-09-22: 4 mg via INTRAVENOUS

## 2018-09-22 SURGICAL SUPPLY — 16 items
BAG DRAIN CYSTO-URO LG1000N (MISCELLANEOUS) ×3 IMPLANT
GLOVE BIO SURGEON STRL SZ 6.5 (GLOVE) ×2 IMPLANT
GLOVE BIO SURGEONS STRL SZ 6.5 (GLOVE) ×1
GOWN STRL REUS W/ TWL LRG LVL3 (GOWN DISPOSABLE) ×2 IMPLANT
GOWN STRL REUS W/TWL LRG LVL3 (GOWN DISPOSABLE) ×6
NDL INJETAK FLEX 70CM BOTOX (NEEDLE) ×1 IMPLANT
NDL SAFETY ECLIPSE 18X1.5 (NEEDLE) ×2 IMPLANT
NEEDLE HYPO 18GX1.5 SHARP (NEEDLE) ×4
NEEDLE INJETAK FLEX 70CM BOTOX (NEEDLE) ×3 IMPLANT
PACK CYSTO AR (MISCELLANEOUS) ×3 IMPLANT
SET CYSTO W/LG BORE CLAMP LF (SET/KITS/TRAYS/PACK) ×3 IMPLANT
SOL .9 NS 3000ML IRR  AL (IV SOLUTION) ×2
SOL .9 NS 3000ML IRR UROMATIC (IV SOLUTION) ×1 IMPLANT
SURGILUBE 2OZ TUBE FLIPTOP (MISCELLANEOUS) ×3 IMPLANT
SYR 10ML LL (SYRINGE) ×6 IMPLANT
WATER STERILE IRR 1000ML POUR (IV SOLUTION) ×3 IMPLANT

## 2018-09-22 NOTE — Anesthesia Preprocedure Evaluation (Signed)
Anesthesia Evaluation  Patient identified by MRN, date of birth, ID band Patient awake    Reviewed: Allergy & Precautions, H&P , NPO status , Patient's Chart, lab work & pertinent test results, reviewed documented beta blocker date and time   Airway Mallampati: II  TM Distance: >3 FB Neck ROM: full    Dental no notable dental hx. (+) Teeth Intact   Pulmonary sleep apnea ,    Pulmonary exam normal breath sounds clear to auscultation       Cardiovascular Exercise Tolerance: Poor hypertension, On Medications negative cardio ROS   Rhythm:regular Rate:Normal     Neuro/Psych negative neurological ROS  negative psych ROS   GI/Hepatic Neg liver ROS, GERD  Medicated,  Endo/Other  diabetes, Well Controlled, Type 2Hypothyroidism   Renal/GU      Musculoskeletal   Abdominal   Peds  Hematology negative hematology ROS (+)   Anesthesia Other Findings   Reproductive/Obstetrics negative OB ROS                             Anesthesia Physical Anesthesia Plan  ASA: III  Anesthesia Plan: MAC   Post-op Pain Management:    Induction:   PONV Risk Score and Plan: 3  Airway Management Planned:   Additional Equipment:   Intra-op Plan:   Post-operative Plan:   Informed Consent: I have reviewed the patients History and Physical, chart, labs and discussed the procedure including the risks, benefits and alternatives for the proposed anesthesia with the patient or authorized representative who has indicated his/her understanding and acceptance.       Plan Discussed with: CRNA  Anesthesia Plan Comments:         Anesthesia Quick Evaluation

## 2018-09-22 NOTE — Interval H&P Note (Signed)
H&P up-to-date.  No changes.   RRR CTAB Treated for UTI, asymptomatic

## 2018-09-22 NOTE — Anesthesia Procedure Notes (Signed)
Procedure Name: LMA Insertion Date/Time: 09/22/2018 7:44 AM Performed by: Rona Ravens, CRNA Pre-anesthesia Checklist: Patient identified, Suction available, Patient being monitored, Timeout performed and Emergency Drugs available Patient Re-evaluated:Patient Re-evaluated prior to induction Oxygen Delivery Method: Circle system utilized Preoxygenation: Pre-oxygenation with 100% oxygen Induction Type: IV induction LMA: LMA inserted LMA Size: 3.5 Tube secured with: Tape Dental Injury: Teeth and Oropharynx as per pre-operative assessment

## 2018-09-22 NOTE — Anesthesia Post-op Follow-up Note (Signed)
Anesthesia QCDR form completed.        

## 2018-09-22 NOTE — Op Note (Signed)
Date of procedure: 09/22/18  Preoperative diagnosis:  1. Refractory OAB  Postoperative diagnosis:  1. Same as above  Procedure: 1. Cystoscopy 2. Intravesical injection of botulinum toxin  Surgeon: Hollice Espy, MD  Anesthesia: General  Complications: None  Intraoperative findings: Uncomplicated procedure  EBL: Minimal  Specimens: None  Drains: None  Indication: Lori Wiggins is a 85 y.o. patient with severe OAB, urge incontintence.  After reviewing the management options for treatment, the patient elected to proceed with the above surgical procedure(s). We have discussed the potential benefits and risks of the procedure, side effects of the proposed treatment, the likelihood of the patient achieving the goals of the procedure, and any potential problems that might occur during the procedure or recuperation. Informed consent has been obtained.  Description of procedure:  The patient was taken to the operating room and general anesthesia was induced.  The patient was placed in the dorsal lithotomy position, prepped and draped in the usual sterile fashion, and preoperative antibiotics were administered. A preoperative time-out was performed.   A 21 French rigid cystoscope was advanced per urethra into the bladder.  The bladder was carefully inspected and was found to be free of any tumors, lesions, or any other pathology.  She did have a cystocele which is reduced during the procedure tell facilitate injections.  At this point time, a total of 100 units of botulinum toxin was injected into the muscularis propria of the bladder in a total of 20 0.5 cc injections across the posterior bladder wall.  The procedure was uncomplicated.  Hemostasis was excellent.  The bladder was drained and the scope was removed.  The patient was then clean and dry, repositioned the supine position, reversed by anesthesia, and taken to the PACU in stable condition  Plan: Patient will follow up in a  few weeks.  They have been taught how to do catheterize themselves intermittently as needed if they are unable to void and will contact us in this situation for guidance.    Hollice Espy, M.D.

## 2018-09-22 NOTE — Transfer of Care (Signed)
Immediate Anesthesia Transfer of Care Note  Patient: Lori Wiggins  Procedure(s) Performed: Bladder BOTOX INJECTION (N/A )  Patient Location: PACU  Anesthesia Type:General  Level of Consciousness: awake, alert  and oriented  Airway & Oxygen Therapy: Patient Spontanous Breathing and Patient connected to face mask oxygen  Post-op Assessment: Report given to RN and Post -op Vital signs reviewed and stable  Post vital signs: Reviewed and stable  Last Vitals:  Vitals Value Taken Time  BP 145/73 09/22/18 0814  Temp    Pulse 79 09/22/18 0816  Resp 18 09/22/18 0816  SpO2 100 % 09/22/18 0816  Vitals shown include unvalidated device data.  Last Pain:  Vitals:   09/22/18 0618  TempSrc: Temporal  PainSc: 0-No pain         Complications: No apparent anesthesia complications

## 2018-09-22 NOTE — Discharge Instructions (Signed)
Botulinum Toxin Bladder Injection  A botulinum toxin bladder injection is a procedure to treat an overactive bladder. During the procedure, a drug called botulinum toxin is injected into the bladder through a long, thin needle. This drug relaxes the bladder muscles and reduces overactivity. You may need this procedure if your medicines are not working or you cannot take them. The procedure may be repeated as needed. The treatment usually lasts for 6 months. Your health care provider will monitor you to see how well you respond. Tell a health care provider about:  Any allergies you have.  All medicines you are taking, including vitamins, herbs, eye drops, creams, and over-the-counter medicines.  Any problems you or family members have had with anesthetic medicines.  Any blood disorders you have.  Any surgeries you have had.  Any medical conditions you have.  Any previous reactions to a botulinum toxin injection.  Any symptoms of urinary tract infection. These include chills, fever, a burning feeling when passing urine, and needing to pass urine often.  Whether you are pregnant or may be pregnant. What are the risks? Generally this is a safe procedure. However, problems may occur, including:  Not being able to pass urine. If this happens, you may need to have your bladder emptied with a thin tube inserted into your urethra (urinary catheter).  Bleeding.  Urinary tract infection.  Allergic reaction to the botulinum toxin.  Pain or burning when passing urine.  Damage to other structures or organs. What happens before the procedure? Staying hydrated Follow instructions from your health care provider about hydration, which may include:  Up to 2 hours before the procedure - you may continue to drink clear liquids, such as water, clear fruit juice, black coffee, and plain tea. Eating and drinking restrictions Follow instructions from your health care provider about eating and  drinking, which may include:  8 hours before the procedure - stop eating heavy meals or foods, such as meat, fried foods, or fatty foods.  6 hours before the procedure - stop eating light meals or foods, such as toast or cereal.  6 hours before the procedure - stop drinking milk or drinks that contain milk.  2 hours before the procedure - stop drinking clear liquids. Medicines Ask your health care provider about:  Changing or stopping your regular medicines. This is especially important if you are taking diabetes medicines or blood thinners.  Taking medicines such as aspirin and ibuprofen. These medicines can thin your blood. Do not take these medicines unless your health care provider tells you to take them.  Taking over-the-counter medicines, vitamins, herbs, and supplements. General instructions  Plan to have someone take you home from the hospital or clinic.  If you will be going home right after the procedure, plan to have someone with you for 24 hours.  Ask your health care provider what steps will be taken to help prevent infection. These may include: ? Removing hair at the procedure site. ? Washing skin with a germ-killing soap. ? Antibiotic medicine. What happens during the procedure?   You will be asked to empty your bladder.  An IV will be inserted into one of your veins.  You will be given one or more of the following: ? A medicine to help you relax (sedative). ? A medicine to numb the area (local anesthetic). ? A medicine to make you fall asleep (general anesthetic).  A long, thin scope called a cystoscope will be passed into your bladder through the part  of the body that carries urine from your bladder (urethra).  The cystoscope will be used to fill your bladder with water.  A long needle will be passed through the cystoscope and into the bladder.  The botulinum toxin will be injected into your bladder. It may be injected into multiple areas of your  bladder.  Your bladder will be emptied, and the cystoscope will be removed. The procedure may vary among health care providers and hospitals. What can I expect after procedure? After your procedure, it is common to have:  Blood-tinged urine.  Burning or soreness when you pass urine. Follow these instructions at home: Medicines  Take over-the-counter and prescription medicines only as told by your health care provider.  If you were prescribed an antibiotic medicine, take it as told by your health care provider. Do not stop taking the antibiotic even if you start to feel better. General instructions   Do not drive for 24 hours if you were given a sedative during your procedure.  Drink enough fluid to keep your urine pale yellow.  Return to your normal activities as told by your health care provider. Ask your health care provider what activities are safe for you.  Keep all follow-up visits as told by your health care provider. This is important. Contact a health care provider if you have:  A fever or chills.  Blood-tinged urine for more than one day after your procedure.  Worsening pain or burning when you pass urine.  Pain or burning when passing urine for more than two days after your procedure.  Trouble emptying your bladder. Get help right away if you:  Have bright red blood in your urine.  Are unable to pass urine. Summary  A botulinum toxin bladder injection is a procedure to treat an overactive bladder.  This is generally a very safe procedure. However, problems may occur, including not being able to pass urine, bleeding, infection, pain, and allergic reactions to medicines.  You will be told when to stop eating and drinking, and what medicines to change or stop. Follow instructions carefully.  After the procedure, it is common to have blood in urine and to have soreness or burning when passing urine.   AMBULATORY SURGERY  DISCHARGE INSTRUCTIONS   1) The  drugs that you were given will stay in your system until tomorrow so for the next 24 hours you should not:  A) Drive an automobile B) Make any legal decisions C) Drink any alcoholic beverage   2) You may resume regular meals tomorrow.  Today it is better to start with liquids and gradually work up to solid foods.  You may eat anything you prefer, but it is better to start with liquids, then soup and crackers, and gradually work up to solid foods.   3) Please notify your doctor immediately if you have any unusual bleeding, trouble breathing, redness and pain at the surgery site, drainage, fever, or pain not relieved by medication.    4) Additional Instructions:        Please contact your physician with any problems or Same Day Surgery at [phone], Monday through Friday 6 am to 4 pm, or [place] at [facility] number at [phone].  Contact a health care provider if you have a fever, have blood in urine for more than a few days, or have trouble passing urine. Get help right away if you have bright red blood in the urine, or if you are unable to pass urine. This  information is not intended to replace advice given to you by your health care provider. Make sure you discuss any questions you have with your health care provider. Document Released: 09/08/2014 Document Revised: 06/28/2017 Document Reviewed: 06/28/2017 Elsevier Patient Education  2020 [employer].

## 2018-09-23 ENCOUNTER — Encounter: Payer: Self-pay | Admitting: Urology

## 2018-09-23 NOTE — Anesthesia Postprocedure Evaluation (Signed)
Anesthesia Post Note  Patient: Lori Wiggins  Procedure(s) Performed: Bladder BOTOX INJECTION (N/A )  Patient location during evaluation: PACU Anesthesia Type: General Level of consciousness: awake and alert Pain management: pain level controlled Vital Signs Assessment: post-procedure vital signs reviewed and stable Respiratory status: spontaneous breathing, nonlabored ventilation, respiratory function stable and patient connected to nasal cannula oxygen Cardiovascular status: blood pressure returned to baseline and stable Postop Assessment: no apparent nausea or vomiting Anesthetic complications: no     Last Vitals:  Vitals:   09/22/18 0851 09/22/18 0910  BP: (!) 149/67 139/65  Pulse: 71 61  Resp: 18 18  Temp: (!) 36.2 C   SpO2: 100% 100%    Last Pain:  Vitals:   09/23/18 0811  TempSrc:   PainSc: 0-No pain                 Molli Barrows

## 2018-09-29 ENCOUNTER — Emergency Department: Payer: PPO

## 2018-09-29 ENCOUNTER — Telehealth: Payer: Self-pay

## 2018-09-29 ENCOUNTER — Encounter: Payer: Self-pay | Admitting: Emergency Medicine

## 2018-09-29 ENCOUNTER — Emergency Department
Admission: EM | Admit: 2018-09-29 | Discharge: 2018-09-29 | Disposition: A | Discharge status: Home / Self Care | Payer: PPO | Attending: Student in an Organized Health Care Education/Training Program | Admitting: Student in an Organized Health Care Education/Training Program

## 2018-09-29 ENCOUNTER — Other Ambulatory Visit: Payer: Self-pay

## 2018-09-29 DIAGNOSIS — E039 Hypothyroidism, unspecified: Secondary | ICD-10-CM | POA: Diagnosis not present

## 2018-09-29 DIAGNOSIS — Z882 Allergy status to sulfonamides status: Secondary | ICD-10-CM | POA: Diagnosis not present

## 2018-09-29 DIAGNOSIS — Z853 Personal history of malignant neoplasm of breast: Secondary | ICD-10-CM | POA: Insufficient documentation

## 2018-09-29 DIAGNOSIS — R079 Chest pain, unspecified: Secondary | ICD-10-CM | POA: Diagnosis not present

## 2018-09-29 DIAGNOSIS — Z79899 Other long term (current) drug therapy: Secondary | ICD-10-CM | POA: Diagnosis not present

## 2018-09-29 DIAGNOSIS — Z7982 Long term (current) use of aspirin: Secondary | ICD-10-CM | POA: Insufficient documentation

## 2018-09-29 DIAGNOSIS — Z886 Allergy status to analgesic agent status: Secondary | ICD-10-CM | POA: Diagnosis not present

## 2018-09-29 DIAGNOSIS — I10 Essential (primary) hypertension: Secondary | ICD-10-CM | POA: Insufficient documentation

## 2018-09-29 DIAGNOSIS — R0789 Other chest pain: Secondary | ICD-10-CM | POA: Diagnosis not present

## 2018-09-29 LAB — CBC
HCT: 41.9 % (ref 36.0–46.0)
Hemoglobin: 13.5 g/dL (ref 12.0–15.0)
MCH: 28.2 pg (ref 26.0–34.0)
MCHC: 32.2 g/dL (ref 30.0–36.0)
MCV: 87.5 fL (ref 80.0–100.0)
Platelets: 173 10*3/uL (ref 150–400)
RBC: 4.79 MIL/uL (ref 3.87–5.11)
RDW: 14.8 % (ref 11.5–15.5)
WBC: 9.6 10*3/uL (ref 4.0–10.5)
nRBC: 0 % (ref 0.0–0.2)

## 2018-09-29 LAB — BASIC METABOLIC PANEL
Anion gap: 9 (ref 5–15)
BUN: 17 mg/dL (ref 8–23)
CO2: 24 mmol/L (ref 22–32)
Calcium: 9.4 mg/dL (ref 8.9–10.3)
Chloride: 103 mmol/L (ref 98–111)
Creatinine, Ser: 0.65 mg/dL (ref 0.44–1.00)
GFR calc Af Amer: >60 mL/min (ref >60)
GFR calc non Af Amer: >60 mL/min (ref >60)
Glucose, Bld: 117 mg/dL — ABNORMAL HIGH (ref 70–99)
Potassium: 3.9 mmol/L (ref 3.5–5.1)
Sodium: 136 mmol/L (ref 135–145)

## 2018-09-29 LAB — TROPONIN I (HIGH SENSITIVITY)
Troponin I (High Sensitivity): 5 ng/L (ref <18)
Troponin I (High Sensitivity): 5 ng/L (ref <18)

## 2018-09-29 MED ORDER — LIDOCAINE 5 % EX PTCH: 1.0000 | MEDICATED_PATCH | Freq: Two times a day (BID) | CUTANEOUS | 0 refills | Status: AC

## 2018-09-29 MED ORDER — LIDOCAINE 5 % EX PTCH
1.0000 | MEDICATED_PATCH | CUTANEOUS | Status: DC
  Administered 2018-09-29: 12:00:00 1 via TRANSDERMAL
  Filled 2018-09-29: qty 1

## 2018-09-29 MED ORDER — SODIUM CHLORIDE 0.9% FLUSH: 3.0000 mL | Freq: Once | INTRAVENOUS | Status: DC

## 2018-09-29 NOTE — ED Triage Notes (Signed)
Pt here with c/o epigastric/cp pain for the past few days, states it feels like a pulled muscle, has tried biofreeze and ibuprofen but the pain comes right back. Pt in NAD at this time.

## 2018-09-29 NOTE — ED Notes (Signed)

## 2018-09-29 NOTE — Telephone Encounter (Signed)
Called pt who states she is currently in the ED, pt is being accessed for chest pain. While speaking to the patient the ED physician entered the room. Advised pt to be seen and cared for as chest pain takes precedence. Pt gave verbal understanding.

## 2018-09-29 NOTE — ED Provider Notes (Signed)
[facility] Emergency Department Provider Note    First MD Initiated Contact with Patient 09/29/18 1120     (approximate)  I have reviewed the triage vital signs and the nursing notes.   HISTORY  Chief Complaint Chest Pain    HPI Lori Wiggins is a 85 y.o. female close past medical history presents the ER for midsternal reproducible anterior chest pain states that she noticed it first on Friday.  States is worse whenever she moves.  Denies any shortness of breath.  States the pain is not constant.  Denies any diaphoresis.  Denies any nausea or vomiting.  Is never had pain like this before.   Past Medical History:  Diagnosis Date  . Arthritis   . Breast cancer ([place]) 2012   left  breast  . Equilibrium disorder   . GERD (gastroesophageal reflux disease)   . Hypertension   . Hypothyroidism   . Personal history of radiation therapy   . Sleep apnea    uses CPAP   Family History  Problem Relation Age of Onset  . Alzheimer's disease Mother   . Heart attack Father   . Breast cancer Neg Hx    Past Surgical History:  Procedure Laterality Date  . ABDOMINAL HYSTERECTOMY    . APPENDECTOMY    . BOTOX INJECTION N/A 09/22/2018   Procedure: Bladder BOTOX INJECTION;  Surgeon: Hollice Espy, MD;  Location: [facility] ORS;  Service: Urology;  Laterality: N/A;  General vs MAC for anesthesia  . BREAST BIOPSY Left 2012   Positive  . BREAST EXCISIONAL BIOPSY Right    at age 40's  . BREAST LUMPECTOMY Left 2012   F/U radiation   . BREAST LUMPECTOMY WITH AXILLARY LYMPH NODE BIOPSY Left 2012   with Radiation  . BREAST SURGERY Left 2012   Lumpectomy  . CARDIAC CATHETERIZATION     10 years ago  . CATARACT EXTRACTION W/ INTRAOCULAR LENS  IMPLANT, BILATERAL Bilateral   . COLONOSCOPY    . JOINT REPLACEMENT    . TONSILLECTOMY    . TOTAL KNEE ARTHROPLASTY Left 07/21/2012   Dr Mayer Camel  . TOTAL KNEE ARTHROPLASTY Left 07/21/2012   Procedure: TOTAL KNEE ARTHROPLASTY;   Surgeon: Kerin Salen, MD;  Location: [place];  Service: Orthopedics;  Laterality: Left;   Patient Active Problem List   Diagnosis Date Noted  . Ataxia 02/04/2017  . Sinus bradycardia 02/04/2017  . Dizziness 01/21/2017  . History of stroke 01/21/2017  . Unspecified constipation 08/04/2012  . Unspecified hypothyroidism 07/31/2012  . Obstructive sleep apnea 07/31/2012  . E. coli UTI (urinary tract infection), fluoroquinolone resistant 07/26/2012  . Essential hypertension, benign 07/26/2012  . Ileus, postoperative ([place]) 07/24/2012  . Hyponatremia 07/24/2012  . Arthritis of knee, left 07/21/2012      Prior to Admission medications   Medication Sig Start Date End Date Taking? Authorizing Provider  amLODipine (NORVASC) 2.5 MG tablet Take 2.5 mg by mouth 2 (two) times daily.  05/22/16   [provider]  aspirin EC 81 MG tablet Take 81 mg by mouth daily.    [provider]  Azelastine HCl 0.15 % SOLN Place 1 spray into both nostrils 2 (two) times daily.  11/08/17   [provider]  Calcium-Vitamin D-Vitamin K (VIACTIV CALCIUM PLUS D PO) Take 1 tablet by mouth daily.    [provider]  Cholecalciferol (VITAMIN D3 PO) Take 1 tablet by mouth daily.    [provider]  Coenzyme Q10 (COQ10 PO)  Take 1 capsule by mouth daily.    [provider]  gentamicin ointment (GARAMYCIN) 0.1 % Apply 1 application topically 2 (two) times daily. Apply to bottom of nose for CPAP mask 01/09/16   [provider]  lidocaine (LIDODERM) 5 % Place 1 patch onto the skin every 12 (twelve) hours. Remove & Discard patch within 12 hours or as directed by MD 09/29/18 09/29/19  Merlyn Lot, MD  MAGNESIUM-ZINC PO Take 1 tablet by mouth daily.    [provider]  Menthol, Topical Analgesic, (BLUE-EMU MAXIMUM STRENGTH EX) Apply 1 application topically 2 (two) times daily as needed (pain).    [provider]  metoprolol succinate (TOPROL-XL) 25 MG 24  hr tablet Take 25 mg by mouth daily.  05/29/16   [provider]  Misc Natural Products (JOINT HEALTH PO) Take 1 capsule by mouth daily. Peak Mobility    [provider]    Allergies Sulfa antibiotics and Nsaids    Social History Social History   Tobacco Use  . Smoking status: Never Smoker  . Smokeless tobacco: Never Used  Substance Use Topics  . Alcohol use: Yes    Alcohol/week: 7.0 standard drinks    Types: 7 Glasses of wine per week    Comment: social  . Drug use: No    Review of Systems Patient denies headaches, rhinorrhea, blurry vision, numbness, shortness of breath, chest pain, edema, cough, abdominal pain, nausea, vomiting, diarrhea, dysuria, fevers, rashes or hallucinations unless otherwise stated above in HPI. ____________________________________________   PHYSICAL EXAM:  VITAL SIGNS: Vitals:   09/29/18 1032  BP: (!) 150/61  Pulse: 66  Resp: 16  Temp: 97.7 F (36.5 C)  SpO2: 100%    Constitutional: Alert and oriented.  Eyes: Conjunctivae are normal.  Head: Atraumatic. Nose: No congestion/rhinnorhea. Mouth/Throat: Mucous membranes are moist.   Neck: No stridor. Painless ROM.  Cardiovascular: Normal rate, regular rhythm. Grossly normal heart sounds.  Good peripheral circulation. Respiratory: Normal respiratory effort.  No retractions. Lungs CTAB. Gastrointestinal: Soft and nontender. No distention. No abdominal bruits. No CVA tenderness. Genitourinary:  Musculoskeletal: No lower extremity tenderness nor edema.  No joint effusions. Neurologic:  Normal speech and language. No gross focal neurologic deficits are appreciated. No facial droop Skin:  Skin is warm, dry and intact. No rash noted. Psychiatric: Mood and affect are normal. Speech and behavior are normal.  ____________________________________________   LABS (all labs ordered are listed, but only abnormal results are displayed)  Results for orders placed or performed during the  hospital encounter of 09/29/18 (from the past 24 hour(s))  Basic metabolic panel     Status: Abnormal   Collection Time: 09/29/18 10:46 AM  Result Value Ref Range   Sodium 136 135 - 145 mmol/L   Potassium 3.9 3.5 - 5.1 mmol/L   Chloride 103 98 - 111 mmol/L   CO2 24 22 - 32 mmol/L   Glucose, Bld 117 (H) 70 - 99 mg/dL   BUN 17 8 - 23 mg/dL   Creatinine, Ser 0.65 0.44 - 1.00 mg/dL   Calcium 9.4 8.9 - 10.3 mg/dL   GFR calc non Af Amer >60 >60 mL/min   GFR calc Af Amer >60 >60 mL/min   Anion gap 9 5 - 15  CBC     Status: None   Collection Time: 09/29/18 10:46 AM  Result Value Ref Range   WBC 9.6 4.0 - 10.5 K/uL   RBC 4.79 3.87 - 5.11 MIL/uL   Hemoglobin 13.5 12.0 -  15.0 g/dL   HCT 41.9 36.0 - 46.0 %   MCV 87.5 80.0 - 100.0 fL   MCH 28.2 26.0 - 34.0 pg   MCHC 32.2 30.0 - 36.0 g/dL   RDW 14.8 11.5 - 15.5 %   Platelets 173 150 - 400 K/uL   nRBC 0.0 0.0 - 0.2 %  Troponin I (High Sensitivity)     Status: None   Collection Time: 09/29/18 10:46 AM  Result Value Ref Range   Troponin I (High Sensitivity) 5 <18 ng/L  Troponin I (High Sensitivity)     Status: None   Collection Time: 09/29/18 12:23 PM  Result Value Ref Range   Troponin I (High Sensitivity) 5 <18 ng/L   ____________________________________________  EKG My review and personal interpretation at Time: 10:38   Indication: chest pain  Rate: 65  Rhythm: sinus Axis: normal Other: normal intervals, no stemi ____________________________________________  RADIOLOGY  I personally reviewed all radiographic images ordered to evaluate for the above acute complaints and reviewed radiology reports and findings.  These findings were personally discussed with the patient.  Please see medical record for radiology report.  ____________________________________________   PROCEDURES  Procedure(s) performed:  Procedures    Critical Care performed: no ____________________________________________   INITIAL IMPRESSION / ASSESSMENT  AND PLAN / ED COURSE  Pertinent labs & imaging results that were available during my care of the patient were reviewed by me and considered in my medical decision making (see chart for details).   DDX: ACS, pericarditis, esophagitis, boerhaaves, pe, dissection, pna, bronchitis, costochondritis   Lori Wiggins is a 85 y.o. who presents to the ED with symptoms as described above.  Seems primarily musculoskeletal in nature.  Serial enzymes are negative.  Pain is already improving after 4 days of discomfort.  Pain improved after topical application medication.  No evidence of shingles.  No evidence of pneumothorax or mass.  Does not seem consistent with PE or dissection.  Stable appropriate for outpatient follow-up.     The patient was evaluated in Emergency Department today for the symptoms described in the history of present illness. He/she was evaluated in the context of the global COVID-19 pandemic, which necessitated consideration that the patient might be at risk for infection with the SARS-CoV-2 virus that causes COVID-19. Institutional protocols and algorithms that pertain to the evaluation of patients at risk for COVID-19 are in a state of rapid change based on information released by regulatory bodies including the CDC and federal and state organizations. These policies and algorithms were followed during the patient's care in the ED.  As part of my medical decision making, I reviewed the following data within the [place] notes reviewed and incorporated, Labs reviewed, notes from prior ED visits and [place] Controlled Substance Database   ____________________________________________   FINAL CLINICAL IMPRESSION(S) / ED DIAGNOSES  Final diagnoses:  Chest wall pain      NEW MEDICATIONS STARTED DURING THIS VISIT:  New Prescriptions   LIDOCAINE (LIDODERM) 5 %    Place 1 patch onto the skin every 12 (twelve) hours. Remove & Discard patch within 12 hours or as  directed by MD     Note:  This document was prepared using Dragon voice recognition software and may include unintentional dictation errors.    Merlyn Lot, MD 09/29/18 [phone]

## 2018-09-29 NOTE — Telephone Encounter (Signed)
ERROR

## 2018-10-01 ENCOUNTER — Telehealth: Payer: Self-pay | Admitting: Physician Assistant

## 2018-10-01 NOTE — Telephone Encounter (Signed)
Pt Lori Wiggins and states that she is real anxious about the Botox and would like a call back.

## 2018-10-01 NOTE — Telephone Encounter (Signed)
Pt missed a call from our office and would like to speak with someone about Botox.

## 2018-10-02 NOTE — Telephone Encounter (Signed)
Spoke with patient she is still having urinary incontinence and wants to know when the mediation will work. She was told that it can take 2wks for the medication to start working and 4-6wks to have full effect. Patient was concerned about this and upset she had to wait on medication to work. Patient was encouraged to just give it some more time and keep follow up on 10-06-18

## 2018-10-03 ENCOUNTER — Ambulatory Visit: Payer: PPO | Admitting: Urology

## 2018-10-06 ENCOUNTER — Ambulatory Visit (INDEPENDENT_AMBULATORY_CARE_PROVIDER_SITE_OTHER): Payer: PPO | Admitting: Physician Assistant

## 2018-10-06 ENCOUNTER — Other Ambulatory Visit: Payer: Self-pay

## 2018-10-06 ENCOUNTER — Encounter: Payer: Self-pay | Admitting: Physician Assistant

## 2018-10-06 VITALS — BP 121/71 | HR 61 | Ht 62.0 in | Wt 121.0 lb

## 2018-10-06 DIAGNOSIS — K5900 Constipation, unspecified: Secondary | ICD-10-CM

## 2018-10-06 DIAGNOSIS — N3281 Overactive bladder: Secondary | ICD-10-CM | POA: Diagnosis not present

## 2018-10-06 LAB — BLADDER SCAN AMB NON-IMAGING: Scan Result: 39

## 2018-10-06 NOTE — Progress Notes (Signed)
10/06/2018 12:52 PM   Lori Wiggins 1928-06-09 [id number]  CC: Urinary incontinence  HPI: Lori Wiggins is a 85 y.o. female who presents today for evaluation of possible UTI. She is an established [facility] patient with PMH refractory OAB with urge incontinence who last saw Dr. Erlene Quan 2 weeks ago for cystoscopy with intravesical injection of botulinum toxin.  She is accompanied today by her daughter, who contributed to her HPI.  She returns to the clinic today with multiple questions and concerns regarding her recent botulinum toxin injections, as below: 1. She is continuing to experience urinary incontinence and wearing absorbant diapers for these. She believes this is irritating her hemorrhoids due to decreased air flow to the perineum. She notes a single external hemorrhoid that is not currently irritated. She has been using Preparation H topically on this in the hope that it would resolve. She is thinking about switching to a different hemorrhoid cream that she recently saw advertising for. 2. She wonders why she was counseled to stop Detrol LA, as she states this was providing symptomatic relief. Per note by Dr. Erlene Quan on 08/15/2018, the standard dose of this medication did not offer symptomatic relief, and patient complained of side effects from a larger dose such that she was not taking the medication daily as prescribed. In clinic today, she denies any side effects of this medication and would like to restart it. Her daughter states that she did have side effects from it, but Lori Wiggins insists that this is not the case. 3. She would like to know what Dr. Erlene Quan found on cystoscopy and what her interpretation of these findings is. 4. She wonders how much longer before the Botox will take effect and would like to know what to do in the meantime while she awaits therapeutic benefit of the Botox. Her daughter reports that Lori Wiggins described a decrease in the frequency and volume of  urinary incontinence following the botulinum toxin injections, but Lori Wiggins disagrees. She insists that she still cannot "be in polite company" with her symptoms at their current degree of severity. 5. She wonders if Kegel exercises would be helpful for her symptoms of urinary incontinence. She states she does these at home and has an external device to assist her in completing these, however she says she has been unable to move the mechanism on the device in the way it is intended and so she does not believe it is helping. Of note, when she mentions Kegel exercises, she demonstrates this by visibly bearing down. 6. She wonders if her current bowel regimen of nightly Miralax is appropriate for management of her constipation. She states she is considering switching to a different laxative that would work overnight.  PVR 19m.  PMH: Past Medical History:  Diagnosis Date   Arthritis    Breast cancer ([place] 2012   left  breast   Equilibrium disorder    GERD (gastroesophageal reflux disease)    Hypertension    Hypothyroidism    Personal history of radiation therapy    Sleep apnea    uses CPAP    Surgical History: Past Surgical History:  Procedure Laterality Date   ABDOMINAL HYSTERECTOMY     APPENDECTOMY     BOTOX INJECTION N/A 09/22/2018   Procedure: Bladder BOTOX INJECTION;  Surgeon: BHollice Espy MD;  Location: [facility] ORS;  Service: Urology;  Laterality: N/A;  General vs MAC for anesthesia   BREAST BIOPSY Left 2012   Positive   BREAST EXCISIONAL  BIOPSY Right    at age 45's   BREAST LUMPECTOMY Left 2012   F/U radiation    BREAST LUMPECTOMY WITH AXILLARY LYMPH NODE BIOPSY Left 2012   with Radiation   BREAST SURGERY Left 2012   Lumpectomy   CARDIAC CATHETERIZATION     10 years ago   CATARACT EXTRACTION W/ INTRAOCULAR LENS  IMPLANT, BILATERAL Bilateral    COLONOSCOPY     JOINT REPLACEMENT     TONSILLECTOMY     TOTAL KNEE ARTHROPLASTY Left 07/21/2012   Dr  Mayer Camel   TOTAL KNEE ARTHROPLASTY Left 07/21/2012   Procedure: TOTAL KNEE ARTHROPLASTY;  Surgeon: Kerin Salen, MD;  Location: [place];  Service: Orthopedics;  Laterality: Left;    Home Medications:  Allergies as of 10/06/2018      Reactions   Sulfa Antibiotics Other (See Comments)   Unknown Unknown   Nsaids Rash, Other (See Comments)   Pt. does not want to take at all. Pt. does not want to take at all.      Medication List       Accurate as of October 06, 2018 12:52 PM. If you have any questions, ask your nurse or doctor.        amLODipine 2.5 MG tablet Commonly known as: NORVASC Take 2.5 mg by mouth 2 (two) times daily.   aspirin EC 81 MG tablet Take 81 mg by mouth daily.   Azelastine HCl 0.15 % Soln Place 1 spray into both nostrils 2 (two) times daily.   BLUE-EMU MAXIMUM STRENGTH EX Apply 1 application topically 2 (two) times daily as needed (pain).   COQ10 PO Take 1 capsule by mouth daily.   gentamicin ointment 0.1 % Commonly known as: GARAMYCIN Apply 1 application topically 2 (two) times daily. Apply to bottom of nose for CPAP mask   JOINT HEALTH PO Take 1 capsule by mouth daily. Peak Mobility   lidocaine 5 % Commonly known as: Lidoderm Place 1 patch onto the skin every 12 (twelve) hours. Remove & Discard patch within 12 hours or as directed by MD   MAGNESIUM-ZINC PO Take 1 tablet by mouth daily.   metoprolol succinate 25 MG 24 hr tablet Commonly known as: TOPROL-XL Take 25 mg by mouth daily.   VIACTIV CALCIUM PLUS D PO Take 1 tablet by mouth daily.   VITAMIN D3 PO Take 1 tablet by mouth daily.       Allergies:  Allergies  Allergen Reactions   Sulfa Antibiotics Other (See Comments)    Unknown Unknown     Nsaids Rash and Other (See Comments)    Pt. does not want to take at all. Pt. does not want to take at all.    Family History: Family History  Problem Relation Age of Onset   Alzheimer's disease Mother    Heart attack Father     Breast cancer Neg Hx     Social History:   reports that she has never smoked. She has never used smokeless tobacco. She reports current alcohol use of about 7.0 standard drinks of alcohol per week. She reports that she does not use drugs.  ROS: UROLOGY Frequent Urination?: Yes Hard to postpone urination?: No Burning/pain with urination?: No Get up at night to urinate?: Yes Leakage of urine?: Yes Urine stream starts and stops?: Yes Trouble starting stream?: No Do you have to strain to urinate?: No Blood in urine?: No Urinary tract infection?: No Sexually transmitted disease?: No Injury to kidneys or bladder?: No Painful  intercourse?: No Weak stream?: No Currently pregnant?: No Vaginal bleeding?: No Last menstrual period?: n  Gastrointestinal Nausea?: No Vomiting?: No Indigestion/heartburn?: No Diarrhea?: No Constipation?: No  Constitutional Fever: No Night sweats?: No Weight loss?: No Fatigue?: No  Skin Skin rash/lesions?: No Itching?: No  Eyes Blurred vision?: No Double vision?: No  Ears/Nose/Throat Sore throat?: No Sinus problems?: No  Hematologic/Lymphatic Swollen glands?: No Easy bruising?: No  Cardiovascular Leg swelling?: Yes Chest pain?: No  Respiratory Cough?: No Shortness of breath?: No  Endocrine Excessive thirst?: No  Musculoskeletal Back pain?: No Joint pain?: Yes  Neurological Headaches?: No Dizziness?: No  Psychologic Depression?: No Anxiety?: No  Physical Exam: BP 121/71 (BP Location: Left Arm, Patient Position: Sitting, Cuff Size: Normal)    Pulse 61    Ht _0  (1.575 m)    Wt 121 lb (54.9 kg)    BMI 22.13 kg/m   Constitutional:  Alert and oriented, no acute distress, nontoxic appearing HEENT: [place], AT Cardiovascular: No clubbing, cyanosis, or edema Respiratory: Normal respiratory effort, no increased work of breathing Skin: No rashes, bruises or suspicious lesions Neurologic: Moving all 4 extremities, needs  assistance in sit-to-stand Psychiatric: Normal mood and affect  Laboratory Data: Results for orders placed or performed in visit on 10/06/18  BLADDER SCAN AMB NON-IMAGING  Result Value Ref Range   Scan Result 39    Assessment & Plan:   1. Overactive bladder Patient with unclear evolution of symptoms 14 days after intravesical botulinum toxin injections.  Based on her daughter's report, I do suspect her symptoms are improving on this new treatment.  I counseled the patient that it can take up to 2 weeks for this medication to go into effect and that she has not waited an adequate amount of time to assess the full therapeutic benefit of this treatment.  I spent an extended amount of time today discussing with her realistic treatment goals given her history of refractory OAB with urge incontinence.  I explained that it may not be possible for her to become completely dry.  Rather, I explained that her treatment regimen may be multimodal including botulinum toxin injections.  I explained that our treatment goal needed to be improvement of her symptoms rather than complete resolution of them.  She expressed understanding.  Patient is resolute that she would like to restart Detrol LA.  She insists that she did not experience side effects on this medication in the past despite a note and her daughter's report otherwise.  I am not opposed to her restarting this medication, however it is too soon following botulinum toxin injections for her to do this; I would prefer to wait to assess for full treatment benefit of the botulinum toxin before adding another medication due to the risk of urinary retention.  I counseled her that I would like for her to wait an additional 2 weeks, return to clinic for a PVR, and then we will discuss restarting this medication.  Of note, her PVR today is within normal limits and I am not currently concerned for urinary retention.  I shared with her Dr. Cherrie Gauze findings on  cystoscopy 2 weeks ago, namely the absence of lesions or tumors and the presence of cystocele.  Patient also expressed interest in Kegel exercises today.  Given her report that she has been unable to activate the mechanism on the Kegel device she has at home and the manner in which she mimes performing these exercises, I strongly suspect she is not recruiting  the appropriate muscles to complete these exercises.  I do believe she may benefit from Kegel exercises.  I will provide a referral to pelvic rehab today for assessment of this. - BLADDER SCAN AMB NON-IMAGING - Ambulatory referral to Physical Therapy  2. Constipation, unspecified constipation type I counseled the patient that I am concerned about her initiating a stimulant laxative, as I suspect this may worsen her urinary incontinence.  I counseled her to continue with her current regimen of nightly MiraLAX, as I do not believe this regimen is inadequate.  I also explained that I suspect chronic constipation is the source of her hemorrhoids.  I explained that hemorrhoids do not resolve without surgical intervention.  I explained that if her symptoms are not symptomatic, i.e. itchy or painful, that she does not need to treat them.  I counseled her that wearing absorbent diapers will decrease the chance of her developing moisture dermatitis that could irritate her hemorrhoids.  I am not concerned about her hemorrhoids receiving inadequate airflow and believe that she should continue to wear absorbent diapers as needed for her urinary incontinence.  She expressed understanding.  Return in about 2 weeks (around 10/20/2018) for PVR.  I spent 40 min with this patient, of which greater than 50% was spent in counseling and coordination of care with the patient.   Debroah Loop, PA-C  [facility] [address] [place], [place] [address] [phone]

## 2018-10-07 DIAGNOSIS — G4733 Obstructive sleep apnea (adult) (pediatric): Secondary | ICD-10-CM | POA: Diagnosis not present

## 2018-10-14 ENCOUNTER — Other Ambulatory Visit: Payer: Self-pay

## 2018-10-14 ENCOUNTER — Ambulatory Visit: Payer: PPO | Attending: Physician Assistant | Admitting: Physical Therapy

## 2018-10-14 ENCOUNTER — Encounter: Payer: Self-pay | Admitting: Physical Therapy

## 2018-10-14 DIAGNOSIS — M6281 Muscle weakness (generalized): Secondary | ICD-10-CM | POA: Diagnosis not present

## 2018-10-14 DIAGNOSIS — R278 Other lack of coordination: Secondary | ICD-10-CM | POA: Insufficient documentation

## 2018-10-14 DIAGNOSIS — R2689 Other abnormalities of gait and mobility: Secondary | ICD-10-CM | POA: Diagnosis not present

## 2018-10-14 DIAGNOSIS — M2170 Unequal limb length (acquired), unspecified site: Secondary | ICD-10-CM | POA: Diagnosis not present

## 2018-10-14 NOTE — Patient Instructions (Signed)
  Proper body mechanics with getting out of a chair to decrease strain  on back &pelvic floor   Avoid holding your breath when Getting out of the chair:  Scoot to front part of chair chair Heels behind feet, feet are hip width apart, nose over toes  Inhale like you are smelling roses Exhale to stand    ____  Wear shoe lift in R shoe  ____  R side stretch , sliding R arm up, to lengthen R side of body 20 reps x 3  L hand on chair for stability.

## 2018-10-15 NOTE — Therapy (Addendum)
[place] MAIN [facility] SERVICES [address] [place], [place], [address] Phone: [phone]   Fax:  [phone]  Physical Therapy Evaluation  Patient Details  Name: Lori Wiggins MRN: [id number] Date of Birth: 12-Sep-1928 Referring Provider (PT): Vaillancourt   Encounter Date: 10/14/2018    Past Medical History:  Diagnosis Date  . Arthritis   . Breast cancer ([place]) 2012   left  breast  . Equilibrium disorder   . GERD (gastroesophageal reflux disease)   . Hypertension   . Hypothyroidism   . Personal history of radiation therapy   . Sleep apnea    uses CPAP    Past Surgical History:  Procedure Laterality Date  . ABDOMINAL HYSTERECTOMY    . APPENDECTOMY    . BOTOX INJECTION N/A 09/22/2018   Procedure: Bladder BOTOX INJECTION;  Surgeon: Hollice Espy, MD;  Location: [facility];  Service: Urology;  Laterality: N/A;  General vs MAC for anesthesia  . BREAST BIOPSY Left 2012   Positive  . BREAST EXCISIONAL BIOPSY Right    at age 55's  . BREAST LUMPECTOMY Left 2012   F/U radiation   . BREAST LUMPECTOMY WITH AXILLARY LYMPH NODE BIOPSY Left 2012   with Radiation  . BREAST SURGERY Left 2012   Lumpectomy  . CARDIAC CATHETERIZATION     10 years ago  . CATARACT EXTRACTION W/ INTRAOCULAR LENS  IMPLANT, BILATERAL Bilateral   . COLONOSCOPY    . JOINT REPLACEMENT    . TONSILLECTOMY    . TOTAL KNEE ARTHROPLASTY Left 07/21/2012   Dr Mayer Camel  . TOTAL KNEE ARTHROPLASTY Left 07/21/2012   Procedure: TOTAL KNEE ARTHROPLASTY;  Surgeon: Kerin Salen, MD;  Location: [place];  Service: Orthopedics;  Laterality: Left;    There were no vitals filed for this visit.   Subjective Assessment - 10/15/18 2219    Subjective  Pt reports she is not able to stop her urine flow when it starts. Pt went through botox injections but she does see a slight change in leakage. Pt changes pullup and 1-2 x day, wear urinary pads as well. Pt stopped going to social events  because she is not confident if she can get to a bathroom. Pt has wet a quarter size on her pad. Pt feels like the sensation like it is real bad. No pain.  Bowel movements occur  daily. No LBP.    Pertinent History  2 vaginal deliveries with episiotomies.  Abdominal hysterectomy, appendectomy, L TKA, 3-4 years ago, pt went to [place] and started having equilibrium problems. Pt underwent therapy and still have some equilibrium problems.    Patient Stated Goals  stop leaking         [facility] PT Assessment - 10/15/18 2217      Assessment   Medical Diagnosis  overactive bladder     Referring Provider (PT)  Vaillancourt      Precautions   Precautions  None      Restrictions   Weight Bearing Restrictions  No      Home Environment   Additional Comments  lives in [facility], with husband      Prior Function   Level of Independence  Independent      Observation/Other Assessments   Scoliosis  L convex curve lumbar       AROM   Overall AROM Comments  L spinal ~20% rotation, ~30 % R,  sideflexion ~20% R, ~30% L. ( Post Tx: increased AROM with shoe lift in  R shoe)         Palpation   SI assessment   L iliac crest higher, L shoulder higher,     Palpation comment  longitudinal scar over low ab with restricted  , limited diaphragmatic excursion      Coordination: dyscoordination of deep core with overuse of abdominals during cue for pelvic floor contraction           Objective measurements completed on examination: See above findings.      [place] Adult PT Treatment/Exercise - 10/15/18 2217      Ambulation/Gait   Gait velocity  short stride, limited hip ext/flex,no  trunk rotation  ( pre Tx), longer stride (post Tx)     Gait Comments  0.67 m/s (pre Tx),  0.77 m/s ( post Tx)       Neuro Re-ed    Neuro Re-ed Details   cued for log rolling to not strain pelvic floor       Exercises   Exercises  --   see pt instructions            PT Long Term Goals - 10/20/18 0626       PT LONG TERM GOAL #1   Title  Pt will demo IND with HEP    Time  10    Period  Weeks    Status  New    Target Date  12/29/18      PT LONG TERM GOAL #2   Title  Pt will demo deep core coordination and proper technique for level 1 and 2 exercises to promote intraabdominal pressure system for continence    Time  4    Period  Weeks    Status  New    Target Date  11/17/18      PT LONG TERM GOAL #3   Title  Pt will increase gait speed from 0.23ms to > 0.8 m/s in order to make it to the bathroom without leakage    Time  10    Period  Weeks    Status  New    Target Date  12/29/18      PT LONG TERM GOAL #4   Title  Pt will demo decreased abdominal scar restrictions in order to optimize TrA co-activation with diahphragm and pelvic floor to gain continence    Time  4    Period  Weeks    Status  New    Target Date  11/17/18      PT LONG TERM GOAL #5   Title  Pt wil demo less gait deviations ( R trunk lean 2/2 lumbar scoliotic curve) with compliance wearing shoe lift in order to adjust for leg length difference and to walk with longer stride length    Time  2    Period  Weeks    Status  New    Target Date  11/03/18      Additional Long Term Goals   Additional Long Term Goals  Yes      PT LONG TERM GOAL #6   Title  PT will report decreasing use of pull up pad from 2 x day to < 1 x day in order to improve QOL    Time  10    Period  Weeks    Status  New    Target Date  12/29/18               Plan - 10/15/18 2214    Clinical Impression Statement  Pt is 85 yo female who reports of urge incontinence and stopped going to social events in the community because she is not confident she will get to the bathroom before leakage. Clinical assessment show functional urge incontinence plays an role in her confident and report of not making it to the bathroom on time. Pt showed the following: weak intraabdominal pressure system 2/2 scoliosis of lumbar spine, leg length difference,  abdominal scar restrictions, slow gait speed, R trunk lean in gait, dyscoordination of deep core, and poor education on body mechanics to minimizie strain on abdominal and pelvic floor mm.   Contributing factors to deep core system include: 2 vaginal deliveries with episiotomies.  abdominal hysterectomy, appendectomy.   These areas will be addressed to mobilize scar restrictions and optimize deep core coordination of diaphragm, TrA, and pelvic floor mm and increase deep core strength. Following Tx today,  Pt showed increased gait speed from 0.6 m/s to 0.7 m/s with longer stride length and less R trunk lean after being provided a shoe lift in R shoe as pt demo'd longer L leg where she had Hx of TKA. Pt reports equilibrium problems,  underwent therapy and still have some equilibrium problems. Suspect correcting leg length difference and lumbar scoliotic curve may help with equilibrium which will help minimize pt's risk of falls.  Following pt's Tx today, pt also demo'd proper technique to co-activate deep core system in sit-to -stand transfers which will facilitate her functional movement with toileting as well as minimizing leakage in against gravity movements At next session, plan to apply manual Tx to address abdominal scar restrictions and optimize diaphragmatic excursion at next session to improve deep core coordination.       Personal Factors and Comorbidities  Age,    Examination-Activity Limitations  Continence;Toileting    Stability/Clinical Decision Making  Evolving/Moderate complexity    Clinical Decision Making  Moderate    Rehab Potential  Good    PT Frequency  1x / week    PT Duration  Other (comment)   10   PT Treatment/Interventions  Gait training;Therapeutic exercise;Balance training;Neuromuscular re-education;Therapeutic activities;Manual lymph drainage;Manual techniques;Scar mobilization;Moist Heat;Spinal Manipulations;Patient/family education;Passive range of motion;Stair training     Consulted and Agree with Plan of Care  Patient       Patient will benefit from skilled therapeutic intervention in order to improve the following deficits and impairments:  Abnormal gait, Hypomobility, Difficulty walking, Increased muscle spasms, Improper body mechanics, Decreased scar mobility, Decreased coordination, Decreased activity tolerance, Decreased strength, Postural dysfunction, Decreased endurance, Decreased balance  Visit Diagnosis: Other abnormalities of gait and mobility  Other lack of coordination  Muscle weakness (generalized)  Unequal leg length     Problem List Patient Active Problem List   Diagnosis Date Noted  . Ataxia 02/04/2017  . Sinus bradycardia 02/04/2017  . Dizziness 01/21/2017  . History of stroke 01/21/2017  . Constipation 08/04/2012  . Unspecified hypothyroidism 07/31/2012  . Obstructive sleep apnea 07/31/2012  . E. coli UTI (urinary tract infection), fluoroquinolone resistant 07/26/2012  . Essential hypertension, benign 07/26/2012  . Ileus, postoperative ([place]) 07/24/2012  . Hyponatremia 07/24/2012  . Arthritis of knee, left 07/21/2012    Jerl Mina ,PT, DPT, E-RYT  10/15/2018, 10:21 PM  [place] MAIN [facility] SERVICES [address] [place], [place], [address] Phone: [phone]   Fax:  [phone]  Name: Lori Wiggins MRN: [id number] Date of Birth: [birth year]

## 2018-10-20 ENCOUNTER — Ambulatory Visit: Payer: PPO | Admitting: Physician Assistant

## 2018-10-20 ENCOUNTER — Encounter: Payer: Self-pay | Admitting: Physician Assistant

## 2018-10-20 ENCOUNTER — Ambulatory Visit: Payer: PPO | Admitting: Physical Therapy

## 2018-10-20 ENCOUNTER — Telehealth: Payer: Self-pay | Admitting: Physician Assistant

## 2018-10-20 ENCOUNTER — Other Ambulatory Visit: Payer: Self-pay

## 2018-10-20 VITALS — BP 153/75 | HR 57 | Ht 62.0 in | Wt 124.0 lb

## 2018-10-20 DIAGNOSIS — R2689 Other abnormalities of gait and mobility: Secondary | ICD-10-CM

## 2018-10-20 DIAGNOSIS — R3129 Other microscopic hematuria: Secondary | ICD-10-CM

## 2018-10-20 DIAGNOSIS — M6281 Muscle weakness (generalized): Secondary | ICD-10-CM

## 2018-10-20 DIAGNOSIS — R278 Other lack of coordination: Secondary | ICD-10-CM

## 2018-10-20 DIAGNOSIS — M2170 Unequal limb length (acquired), unspecified site: Secondary | ICD-10-CM

## 2018-10-20 DIAGNOSIS — N3281 Overactive bladder: Secondary | ICD-10-CM

## 2018-10-20 LAB — BLADDER SCAN AMB NON-IMAGING

## 2018-10-20 NOTE — Patient Instructions (Addendum)
Self Care:  1. Sleep with a pillow between knees (so hip and knees are even with one another).  2. When going from sitting to standing, continue to breath out during the hard part.    Exercises to do:  1. Standing right side stretch  2. Laying on back with knees bent, with hands at your scar, pulling up towards your chest, gently. Rocking your knees side to side, feeling a stretch at your scar.  3. Laying on back with knees bent, with hands at your scar, pulling up towards you chest, gently. Tilting your tailbone back squishing it into the bed; then tilting your tailbone up away from the bed. (Small movement).  4. Open Book Exercise (see handout): laying on left side only.

## 2018-10-20 NOTE — Addendum Note (Signed)
Addended by: Jerl Mina on: 10/20/2018 06:53 AM   Modules accepted: Orders

## 2018-10-20 NOTE — Therapy (Cosign Needed)
[place] MAIN [facility] SERVICES [address] [place], [place], [address] Phone: [phone]   Fax:  [phone]  Physical Therapy Treatment  Patient Details  Name: Lori Wiggins MRN: [id number] Date of Birth: Dec 07, 1928 Referring Provider (PT): Vaillancourt   Encounter Date: 10/20/2018  PT End of Session - 10/20/18 0929    Visit Number  2    Number of Visits  10    Date for PT Re-Evaluation  12/23/18    PT Start Time  0900    PT Stop Time  1000    PT Time Calculation (min)  60 min       Past Medical History:  Diagnosis Date  . Arthritis   . Breast cancer ([place]) 2012   left  breast  . Equilibrium disorder   . GERD (gastroesophageal reflux disease)   . Hypertension   . Hypothyroidism   . Personal history of radiation therapy   . Sleep apnea    uses CPAP    Past Surgical History:  Procedure Laterality Date  . ABDOMINAL HYSTERECTOMY    . APPENDECTOMY    . BOTOX INJECTION N/A 09/22/2018   Procedure: Bladder BOTOX INJECTION;  Surgeon: Hollice Espy, MD;  Location: [facility];  Service: Urology;  Laterality: N/A;  General vs MAC for anesthesia  . BREAST BIOPSY Left 2012   Positive  . BREAST EXCISIONAL BIOPSY Right    at age 89's  . BREAST LUMPECTOMY Left 2012   F/U radiation   . BREAST LUMPECTOMY WITH AXILLARY LYMPH NODE BIOPSY Left 2012   with Radiation  . BREAST SURGERY Left 2012   Lumpectomy  . CARDIAC CATHETERIZATION     10 years ago  . CATARACT EXTRACTION W/ INTRAOCULAR LENS  IMPLANT, BILATERAL Bilateral   . COLONOSCOPY    . JOINT REPLACEMENT    . TONSILLECTOMY    . TOTAL KNEE ARTHROPLASTY Left 07/21/2012   Dr Mayer Camel  . TOTAL KNEE ARTHROPLASTY Left 07/21/2012   Procedure: TOTAL KNEE ARTHROPLASTY;  Surgeon: Kerin Salen, MD;  Location: [place];  Service: Orthopedics;  Laterality: Left;    There were no vitals filed for this visit.  Subjective Assessment - 10/20/18 1100    Subjective  Pt reports she is still not  able to stop her urine flow when it starts. Pt reports she finding getting up from a chair a lot easier, and it is more natural to do so. Patient states compliance with standing side bending stretch.    Pertinent History  2 vaginal deliveries with episiotomies.  Abdominal hysterectomy, appendectomy, L TKA, 3-4 years ago, pt went to [place] and started having equilibrium problems. Pt underwent therapy and still have some equilibrium problems.    Patient Stated Goals  stop leaking      Interventions performed this session:  Manual Therapy (45 minutes)  R QL manual traction, decreased tenderness following session.  PA thoracic rotation grades II/III T3-8 with MWM (open book exercise). Improved following session. (R>L tenderness)  Scapular mobility for increased depression, retraction with medial border STM, improved post session.  Gentle rib mobilizations for improved rib excursion. **1cm to 1.5cm improvement post session.   ThereAct (15 minutes)  HEP given for open book to R. Scar MWM with lumbar rotation, and scar MWM with pelvic tilts, scar being pulled in cephalic direction.       [facility] PT Assessment - 10/20/18 0907      Observation/Other Assessments   Observations  increased thoracic kyphosis  Palpation   Spinal mobility  R scapula lowered than L, . hypomobility thoracic region , most signficant T3-10, R paraspinal mm tightness > L,  medial scap mm tightness ( postTx, mm tightness decreased, scapular downward      SI assessment   L iliac crest higher,     Palpation comment  fascial mobility restricted in L upper quadrant, lower R quadrant, QL tightness on R ( post Tx: improved)         Ambulation/Gait   Gait velocity  0.77 m/s                    [facility] Adult PT Treatment/Exercise - 10/20/18 0907      Neuro Re-ed    Neuro Re-ed Details   cued for pelvic tilts, gentle abdominal massage, throacic mobility technique in new HEP       Modalities   Modalities  Moist Heat       Moist Heat Therapy   Number Minutes Moist Heat  5 Minutes    Moist Heat Location  --   during guided relaxation     Manual Therapy   Manual Therapy  Joint mobilization;Soft tissue mobilization;Other (comment)  (Pended)     Manual therapy comments  fascial releases over suprapubic area scar, upper and lower quadrant of B abdomen with MWM to decrease scar restrictions over bladder. PA mob along thoracic segments T3-10, STM/ MWM at paraspinal/ posterior intercostal, QL on R to promote diaphragmatic excursion/ decrease thoracic kyphosis      Joint Mobilization  hyomobile thoracic spine; PA grades II/III T3-8 with MWM (open book). Decreased shoulder elevation, increased mobility and increased scapular mobility post treatment. Rib mobilization for increased rib mobility. *1cm to 1.5 cm rib excursion post treatment.   (Pended)     Soft tissue mobilization  R scapule at lateral border, muscle tension decreased following treatment. R QL traction release in sidelying. Decreased tenderness following treatment. Pt feedback "the tension released all the way up to my shoulder"                   PT Long Term Goals - 10/20/18 1056      PT LONG TERM GOAL #1   Title  Pt will demo IND with HEP    Time  10    Period  Weeks    Status  On-going    Target Date  12/29/18      PT LONG TERM GOAL #2   Title  Pt will demo deep core coordination and proper technique for level 1 and 2 exercises to promote intraabdominal pressure system for continence    Time  4    Period  Weeks    Status  On-going    Target Date  11/17/18      PT LONG TERM GOAL #3   Title  Pt will increase gait speed from 0.26ms to > 0.8 m/s in order to make it to the bathroom without leakage    Baseline  10-20-18 (2nd visit) 0.752m (good carry over from last visit).    Time  10    Period  Weeks    Status  On-going    Target Date  12/29/18      PT LONG TERM GOAL #4   Title  Pt will demo decreased abdominal scar restrictions  in order to optimize TrA co-activation with diahphragm and pelvic floor to gain continence    Baseline  10-20-18- decreased abdominal scar restrictions post manual treatment.  Time  4    Period  Weeks    Status  On-going    Target Date  11/17/18      PT LONG TERM GOAL #5   Title  Pt wil demo less gait deviations ( R trunk lean 2/2 lumbar scoliotic curve) with compliance wearing shoe lift in order to adjust for leg length difference and to walk with longer stride length    Baseline  10-20-18 Pt demonstrated increased upright posture, and reports compliance with heel lift.    Time  2    Period  Weeks    Status  On-going    Target Date  11/03/18      PT LONG TERM GOAL #6   Title  PT will report decreasing use of pull up pad from 2 x day to < 1 x day in order to improve QOL    Time  10    Period  Weeks    Status  On-going    Target Date  12/29/18            Plan - 10/20/18 1054    Clinical Impression Statement Pt demonstrates improved thoracic mobility and decreased R thoracic concave/lateral lean. Improvements in gait speed- good carry over. 0.70 EOS last session, pre test this session is 0.46ms. This improved gait speed can likely help increase confidence to travel to the bathroom efficiently, reduce risks of falls, and assist in reducing urge related incontinence.   Pt tolerated manual Tx without complaints and achieved improved mobility in diaphragm and abdominals in addition to increased fascial mobility and coordination. These improvements will lead to future pelvic floor musculature  training in order to reduce patient's incontinence.  The following measures indicate these positive changes:  Pre test rib excursion 1cm, post test improved to 1.5cm. Pretest RLQ, LUQ tightness, increased scar tightness, post test decreased abdominal tightness and improved scar mobility (given scar MWM for HEP).  Patient will continue to benefit from skilled pelvic health PT to address goals and  address urinary incontinence.    Personal Factors and Comorbidities  Age    Examination-Activity Limitations  Continence;Toileting    Stability/Clinical Decision Making  Evolving/Moderate complexity    Rehab Potential  Good    PT Frequency  1x / week    PT Duration  Other (comment)   10   PT Treatment/Interventions  Gait training;Therapeutic exercise;Balance training;Neuromuscular re-education;Therapeutic activities;Manual lymph drainage;Manual techniques;Scar mobilization;Moist Heat;Spinal Manipulations;Patient/family education;Passive range of motion;Stair training    PT Next Visit Plan  reassess scar mobility,  scapular mobility, QL involvement; open up R side; increased proprioception of pelvic tilts; increase thoracic mobility.    PT Home Exercise Plan  sit<>stand edu; log rolling edu; standing R side stretch; open book to R; scar mobilization with rotation and with pelvic tilts.    Consulted and Agree with Plan of Care  Patient       Patient will benefit from skilled therapeutic intervention in order to improve the following deficits and impairments:  Abnormal gait, Hypomobility, Difficulty walking, Increased muscle spasms, Improper body mechanics, Decreased scar mobility, Decreased coordination, Decreased activity tolerance, Decreased strength, Postural dysfunction, Decreased endurance, Decreased balance  Visit Diagnosis: No diagnosis found.     Problem List Patient Active Problem List   Diagnosis Date Noted  . Ataxia 02/04/2017  . Sinus bradycardia 02/04/2017  . Dizziness 01/21/2017  . History of stroke 01/21/2017  . Constipation 08/04/2012  . Unspecified hypothyroidism 07/31/2012  . Obstructive sleep apnea 07/31/2012  . E.  coli UTI (urinary tract infection), fluoroquinolone resistant 07/26/2012  . Essential hypertension, benign 07/26/2012  . Ileus, postoperative ([place]) 07/24/2012  . Hyponatremia 07/24/2012  . Arthritis of knee, left 07/21/2012    Dozier Berkovich, SPT   10/20/2018, 11:18 AM    Jerl Mina, PT, DPT, E-RYT    [place] MAIN [facility] SERVICES [address] [place], [place], [address] Phone: [phone]   Fax:  [phone]  Name: Lori Wiggins MRN: [id number] Date of Birth: Dec 15, 1928

## 2018-10-20 NOTE — Progress Notes (Signed)
10/20/2018 12:58 PM   Lori Wiggins 09/15/1928 [id number]  CC: Follow-up OAB s/p intravesical botulinum toxin  HPI: Lori Wiggins is a 85 y.o. female who presents today for follow-up of OAB s/p intravesical injection of botulinum toxin on 09/22/2018.  She returned to the clinic on 10/06/2018 with concerns of an adequate therapeutic response and desiring to restart tolterodine.  I advised her to follow-up in 2 weeks to reassess full therapeutic benefit of the botulinum toxin and for PVR before restarting this medication.  Additionally, she was treated for acute cystitis with hematuria prior to her injections.  I would like her to repeat a UA today to prove resolution of MH.  Today she reports decreased frequency of urination and continued urinary incontinence. She denies dysuria, fevers, chills, worsened urgency or frequency, suprapubic pain, or gross hematuria.   She has started pelvic PT since I saw her last, with an assessment of "dyscoordination of deep core with overuse of abdominals during cue for pelvic floor contraction." They are also working with her on her gait, as PT believes she is exhibiting some degree of functional incontinence as well.  In-office UA today positive for trace-intact blood, nitrites, and 2+ leukocyte esterase; urine microscopy with >30 WBCs/HPF, 3-10 RBCs/HPF, and many bacteria. PVR 259m.  PMH: Past Medical History:  Diagnosis Date  . Arthritis   . Breast cancer ([place] 2012   left  breast  . Equilibrium disorder   . GERD (gastroesophageal reflux disease)   . Hypertension   . Hypothyroidism   . Personal history of radiation therapy   . Sleep apnea    uses CPAP    Surgical History: Past Surgical History:  Procedure Laterality Date  . ABDOMINAL HYSTERECTOMY    . APPENDECTOMY    . BOTOX INJECTION N/A 09/22/2018   Procedure: Bladder BOTOX INJECTION;  Surgeon: BHollice Espy MD;  Location: [facility] ORS;  Service: Urology;  Laterality: N/A;  General  vs MAC for anesthesia  . BREAST BIOPSY Left 2012   Positive  . BREAST EXCISIONAL BIOPSY Right    at age 85's . BREAST LUMPECTOMY Left 2012   F/U radiation   . BREAST LUMPECTOMY WITH AXILLARY LYMPH NODE BIOPSY Left 2012   with Radiation  . BREAST SURGERY Left 2012   Lumpectomy  . CARDIAC CATHETERIZATION     10 years ago  . CATARACT EXTRACTION W/ INTRAOCULAR LENS  IMPLANT, BILATERAL Bilateral   . COLONOSCOPY    . JOINT REPLACEMENT    . TONSILLECTOMY    . TOTAL KNEE ARTHROPLASTY Left 07/21/2012   Dr RMayer Camel . TOTAL KNEE ARTHROPLASTY Left 07/21/2012   Procedure: TOTAL KNEE ARTHROPLASTY;  Surgeon: FKerin Salen MD;  [place]  Service: Orthopedics;  Laterality: Left;    Home Medications:  Allergies as of 10/20/2018      Reactions   Sulfa Antibiotics Other (See Comments)   Unknown Unknown   Nsaids Rash, Other (See Comments)   Pt. does not want to take at all. Pt. does not want to take at all.      Medication List       Accurate as of October 20, 2018 11:59 PM. If you have any questions, ask your nurse or doctor.        amLODipine 2.5 MG tablet Commonly known as: NORVASC Take 2.5 mg by mouth 2 (two) times daily.   aspirin EC 81 MG tablet Take 81 mg by mouth daily.   Azelastine HCl 0.15 % Soln  Place 1 spray into both nostrils 2 (two) times daily.   BLUE-EMU MAXIMUM STRENGTH EX Apply 1 application topically 2 (two) times daily as needed (pain).   COQ10 PO Take 1 capsule by mouth daily.   gentamicin ointment 0.1 % Commonly known as: GARAMYCIN Apply 1 application topically 2 (two) times daily. Apply to bottom of nose for CPAP mask   JOINT HEALTH PO Take 1 capsule by mouth daily. Peak Mobility   lidocaine 5 % Commonly known as: Lidoderm Place 1 patch onto the skin every 12 (twelve) hours. Remove & Discard patch within 12 hours or as directed by MD   MAGNESIUM-ZINC PO Take 1 tablet by mouth daily.   metoprolol succinate 25 MG 24 hr tablet Commonly  known as: TOPROL-XL Take 25 mg by mouth daily.   VIACTIV CALCIUM PLUS D PO Take 1 tablet by mouth daily.   VITAMIN D3 PO Take 1 tablet by mouth daily.       Allergies:  Allergies  Allergen Reactions  . Sulfa Antibiotics Other (See Comments)    Unknown Unknown    . Nsaids Rash and Other (See Comments)    Pt. does not want to take at all. Pt. does not want to take at all.    Family History: Family History  Problem Relation Age of Onset  . Alzheimer's disease Mother   . Heart attack Father   . Breast cancer Neg Hx     Social History:   reports that she has never smoked. She has never used smokeless tobacco. She reports current alcohol use of about 7.0 standard drinks of alcohol per week. She reports that she does not use drugs.  ROS: UROLOGY Frequent Urination?: Yes Hard to postpone urination?: Yes Burning/pain with urination?: No Get up at night to urinate?: Yes Leakage of urine?: Yes Urine stream starts and stops?: No Trouble starting stream?: Yes Do you have to strain to urinate?: No Blood in urine?: No Urinary tract infection?: No Sexually transmitted disease?: No Injury to kidneys or bladder?: No Painful intercourse?: No Weak stream?: No Currently pregnant?: No Vaginal bleeding?: No Last menstrual period?: n  Gastrointestinal Nausea?: No Vomiting?: No Indigestion/heartburn?: No Diarrhea?: No Constipation?: No  Constitutional Fever: No Night sweats?: No Weight loss?: No Fatigue?: No  Skin Skin rash/lesions?: No Itching?: No  Eyes Blurred vision?: No Double vision?: No  Ears/Nose/Throat Sore throat?: No Sinus problems?: No  Hematologic/Lymphatic Swollen glands?: No Easy bruising?: No  Cardiovascular Leg swelling?: No Chest pain?: No  Respiratory Cough?: No Shortness of breath?: No  Endocrine Excessive thirst?: No  Musculoskeletal Back pain?: No Joint pain?: No  Neurological Headaches?: No Dizziness?: No   Psychologic Depression?: No Anxiety?: No  Physical Exam: BP (!) 153/75   Pulse (!) 57   Ht _0  (1.575 m)   Wt 124 lb (56.2 kg)   BMI 22.68 kg/m   Constitutional:  Alert and oriented, no acute distress, nontoxic appearing HEENT: [place], AT Cardiovascular: No clubbing, cyanosis, or edema Respiratory: Normal respiratory effort, no increased work of breathing Skin: No rashes, bruises or suspicious lesions Neurologic: Using wheelchair, requires standby assist Psychiatric: Normal mood and affect  Laboratory Data: Results for orders placed or performed in visit on 10/20/18  Microscopic Examination   URINE  Result Value Ref Range   WBC, UA >30 (A) 0 - 5 /hpf   RBC 3-10 (A) 0 - 2 /hpf   Epithelial Cells (non renal) 0-10 0 - 10 /hpf   Renal Epithel, UA 0-10 (  A) None seen /hpf   Bacteria, UA Many (A) None seen/Few  Urinalysis, Complete  Result Value Ref Range   Specific Gravity, UA 1.015 1.005 - 1.030   pH, UA 7.0 5.0 - 7.5   Color, UA Straw Yellow   Appearance Ur Cloudy (A) Clear   Leukocytes,UA 2+ (A) Negative   Protein,UA Negative Negative/Trace   Glucose, UA Negative Negative   Ketones, UA Negative Negative   RBC, UA Trace (A) Negative   Bilirubin, UA Negative Negative   Urobilinogen, Ur 0.2 0.2 - 1.0 mg/dL   Nitrite, UA Positive (A) Negative   Microscopic Examination See below:   BLADDER SCAN AMB NON-IMAGING  Result Value Ref Range   Scan Result 241m    Assessment & Plan:   1. Overactive bladder PVR 2470mtoday.  I am not comfortable restarting her on anticholinergic at this time, as I am concerned this may put her into urinary retention.  I explained this to the patient.  She expressed understanding.  Strongly encouraged patient to continue pelvic rehab.  I again reiterated that her problem is quite complex and will require more than 1 modality to treat.  I repeated my guidance from her last visit that it may not be an achievable goal for usKoreao make her completely dry,  but rather that we can reduce her symptoms.  Recommend patient follow-up with Dr. BrErlene Quanhen she feels the therapeutic benefits of the botulinum toxin are waning for retreatment.  Estimate this in approximately 4 to 5 months; I anticipate this will also allow her adequate time to experience therapeutic benefit from pelvic PT. - BLADDER SCAN AMB NON-IMAGING -Return in about 4 months (around 02/20/2019) for botulinum toxin retreatment with Dr. BrErlene Quan 2. Microscopic hematuria Patient with persistent microscopic hematuria in the setting of a positive UA.  However, patient remains asymptomatic.  Intraoperative cystoscopy 1 month ago negative for lesions.  I believe she has asymptomatic bacteriuria at this time and will not culture or treat.  I counseled patient on signs and symptoms of acute cystitis, including dysuria, acute worsening of urgency and frequency, suprapubic pain, and gross hematuria.  She denies all of these.  I counseled her to return to the office if she develops the symptoms.  She expressed understanding. - Urinalysis, Complete  SaDebroah LoopPAMercy Medical Center West LakesBuSouthaven[address]Su[place]ada[place] [phone]

## 2018-10-21 ENCOUNTER — Ambulatory Visit: Payer: PPO | Admitting: Urology

## 2018-10-21 LAB — URINALYSIS, COMPLETE
Bilirubin, UA: NEGATIVE
Glucose, UA: NEGATIVE
Ketones, UA: NEGATIVE
Nitrite, UA: POSITIVE — AB
Protein,UA: NEGATIVE
Specific Gravity, UA: 1.015 (ref 1.005–1.030)
Urobilinogen, Ur: 0.2 mg/dL (ref 0.2–1.0)
pH, UA: 7 (ref 5.0–7.5)

## 2018-10-21 LAB — MICROSCOPIC EXAMINATION: WBC, UA: >30 /hpf — AB (ref 0–5)

## 2018-10-21 NOTE — Telephone Encounter (Signed)
ERROR

## 2018-10-23 DIAGNOSIS — J309 Allergic rhinitis, unspecified: Secondary | ICD-10-CM | POA: Diagnosis not present

## 2018-10-23 DIAGNOSIS — E7800 Pure hypercholesterolemia, unspecified: Secondary | ICD-10-CM | POA: Diagnosis not present

## 2018-10-23 DIAGNOSIS — R42 Dizziness and giddiness: Secondary | ICD-10-CM | POA: Diagnosis not present

## 2018-10-23 DIAGNOSIS — G4700 Insomnia, unspecified: Secondary | ICD-10-CM | POA: Diagnosis not present

## 2018-10-23 DIAGNOSIS — M810 Age-related osteoporosis without current pathological fracture: Secondary | ICD-10-CM | POA: Diagnosis not present

## 2018-10-27 ENCOUNTER — Other Ambulatory Visit: Payer: Self-pay

## 2018-10-27 ENCOUNTER — Encounter: Payer: PPO | Admitting: Physical Therapy

## 2018-10-27 ENCOUNTER — Ambulatory Visit: Payer: PPO

## 2018-10-27 DIAGNOSIS — G4733 Obstructive sleep apnea (adult) (pediatric): Secondary | ICD-10-CM | POA: Diagnosis not present

## 2018-10-27 DIAGNOSIS — R2689 Other abnormalities of gait and mobility: Secondary | ICD-10-CM | POA: Diagnosis not present

## 2018-10-27 DIAGNOSIS — R278 Other lack of coordination: Secondary | ICD-10-CM

## 2018-10-27 DIAGNOSIS — M6281 Muscle weakness (generalized): Secondary | ICD-10-CM

## 2018-10-27 DIAGNOSIS — M2170 Unequal limb length (acquired), unspecified site: Secondary | ICD-10-CM

## 2018-10-27 NOTE — Therapy (Signed)
[place] MAIN [facility] SERVICES [address] [place], [place], [address] Phone: [phone]   Fax:  [phone]  Physical Therapy Treatment The patient has been informed of current processes in place at Outpatient Rehab to protect patients from Covid-19 exposure including social distancing, schedule modifications, and new cleaning procedures. After discussing their particular risk with a therapist based on the patient's personal risk factors, the patient has decided to proceed with in-person therapy.  Patient Details  Name: Lori Wiggins MRN: [id number] Date of Birth: [birth year] Referring Provider (PT): Vaillancourt   Encounter Date: 10/27/2018  PT End of Session - 10/27/18 1123    Visit Number  3    Number of Visits  10    Date for PT Re-Evaluation  12/23/18    PT Start Time  5732    PT Stop Time  1100    PT Time Calculation (min)  45 min    Activity Tolerance  Patient tolerated treatment well;No increased pain    Behavior During Therapy  WFL for tasks assessed/performed       Past Medical History:  Diagnosis Date  . Arthritis   . Breast cancer ([place]) 2012   left  breast  . Equilibrium disorder   . GERD (gastroesophageal reflux disease)   . Hypertension   . Hypothyroidism   . Personal history of radiation therapy   . Sleep apnea    uses CPAP    Past Surgical History:  Procedure Laterality Date  . ABDOMINAL HYSTERECTOMY    . APPENDECTOMY    . BOTOX INJECTION N/A 09/22/2018   Procedure: Bladder BOTOX INJECTION;  Surgeon: Hollice Espy, MD;  Location: [facility] ORS;  Service: Urology;  Laterality: N/A;  General vs MAC for anesthesia  . BREAST BIOPSY Left 2012   Positive  . BREAST EXCISIONAL BIOPSY Right    at age 48's  . BREAST LUMPECTOMY Left 2012   F/U radiation   . BREAST LUMPECTOMY WITH AXILLARY LYMPH NODE BIOPSY Left 2012   with Radiation  . BREAST SURGERY Left 2012   Lumpectomy  . CARDIAC CATHETERIZATION     10 years  ago  . CATARACT EXTRACTION W/ INTRAOCULAR LENS  IMPLANT, BILATERAL Bilateral   . COLONOSCOPY    . JOINT REPLACEMENT    . TONSILLECTOMY    . TOTAL KNEE ARTHROPLASTY Left 07/21/2012   Dr Mayer Camel  . TOTAL KNEE ARTHROPLASTY Left 07/21/2012   Procedure: TOTAL KNEE ARTHROPLASTY;  Surgeon: Kerin Salen, MD;  Location: [place];  Service: Orthopedics;  Laterality: Left;    There were no vitals filed for this visit.    Pelvic Floor Physical Therapy Treatment Note  SCREENING  Changes in medications, allergies, or medical history?: No    SUBJECTIVE  Patient reports: Patient reports that her HEP has not made her feel bad "shaky" like she did after the last PT session. Her urinary incontinence has decreased during the night, but not during the day. She has been using the pillow between the knees on her side. And she feels a difference when she doesn't use it. Would like to learn how to walk better. "I got a funny gait" and I have "an equilibrium problem"- she said she had an MRI and had two black dots on it and she was told she has had 2 strokes.   Precautions:  Sleep apnea (CPAP); previous acute cystitis with hematuria; breast CA survivor (2012); L TKA (2014)   Pain update:  Location of pain: R foot  pain ("from the rain")  Current pain:  <1/10  Max pain:  3/10 - it had a hard time walking with it Least pain:  0/10 Nature of pain: feels stiff  -- occasional mid back pain when she gets up; decreases with scapular retraction/depression.   Patient Goals: To be able to participate in social activities without fear of not making it to the bathroom.    OBJECTIVE  Changes in: Posture/Observations:  L up-slip in supine; corrected with QL distraction followed by open book to the L.   Range of Motion/Flexibilty:  Only about 25% of thoracic rotation.  Increased time for supine to side-lying; supine<>sit log rolling  Abdominal:  Decreased fascial mobility along scar- improved since eval.    Palpation: L QL tightness   Gait Analysis: New- SPC  - required [facility] education correction; utilized for R side being the more impaired side.   INTERVENTIONS THIS SESSION: Manual Treatment: Pelvic alignment correction with L QL in side-lying. Performance of open book to the L rather than the R for improved thoracic/lumbar mobility. (added to HEP). Performed this session with manual QL release for increased pelvic alignment. Assessment of abdominal fascia. (25 minutes)    Gait Training: SPC training and fitting; pt will require a lower fitting cane (current cane at lowest setting); trained to support R side and increased stride length. Tactile and verbal cues for upright posture. (8 minutes)   Self Care: Step 1 of urge suppression technique (diaphgmatic breathing); pt education on transition from SNS to PNS for improved incontinence symptoms. As well as how physical therapy findings and treatment are related to her incontinence symptoms. Education on posture importance and maintaining achievements made during therapy- pt did not like physical posture straps. (12 minutes)    Total time: 45 minutes (time limited by patients arrival time)                               PT Long Term Goals - 10/20/18 1056      PT LONG TERM GOAL #1   Title  Pt will demo IND with HEP    Time  10    Period  Weeks    Status  On-going    Target Date  12/29/18      PT LONG TERM GOAL #2   Title  Pt will demo deep core coordination and proper technique for level 1 and 2 exercises to promote intraabdominal pressure system for continence    Time  4    Period  Weeks    Status  On-going    Target Date  11/17/18      PT LONG TERM GOAL #3   Title  Pt will increase gait speed from 0.5ms to > 0.8 m/s in order to make it to the bathroom without leakage    Baseline  10-20-18 (2nd visit) 0.7102m (good carry over from last visit).    Time  10    Period  Weeks    Status  On-going    Target  Date  12/29/18      PT LONG TERM GOAL #4   Title  Pt will demo decreased abdominal scar restrictions in order to optimize TrA co-activation with diahphragm and pelvic floor to gain continence    Baseline  10-20-18- decreased abdominal scar restrictions post manual treatment.    Time  4    Period  Weeks    Status  On-going  Target Date  11/17/18      PT LONG TERM GOAL #5   Title  Pt wil demo less gait deviations ( R trunk lean 2/2 lumbar scoliotic curve) with compliance wearing shoe lift in order to adjust for leg length difference and to walk with longer stride length    Baseline  10-20-18 Pt demonstrated increased upright posture, and reports compliance with heel lift.    Time  2    Period  Weeks    Status  On-going    Target Date  11/03/18      PT LONG TERM GOAL #6   Title  Pt will report decreasing use of pull up pad from 2 x day to < 1 x day in order to improve QOL    Time  10    Period  Weeks    Status  On-going    Target Date  12/29/18            Plan - 10/27/18 1124    Clinical Impression Statement  Pt. Responded well to all interventions today, demonstrating improved abdominal fascia tension, improved pelvic obliquity, improved SPC use, as well as understanding and correct performance of all education and exercises provided today. They will continue to benefit from skilled physical therapy to work toward remaining goals and maximize function as well as decrease likelihood of symptom increase or recurrence.    Personal Factors and Comorbidities  Age    Examination-Activity Limitations  Continence;Toileting    Stability/Clinical Decision Making  Evolving/Moderate complexity    Rehab Potential  Good    PT Frequency  1x / week    PT Duration  Other (comment)   10   PT Treatment/Interventions  Gait training;Therapeutic exercise;Balance training;Neuromuscular re-education;Therapeutic activities;Manual lymph drainage;Manual techniques;Scar mobilization;Moist Heat;Spinal  Manipulations;Patient/family education;Passive range of motion;Stair training    PT Next Visit Plan  reassess scar mobility,  scapular mobility, QL involvement; open up R side; increased proprioception of pelvic tilts; increase thoracic mobility; **posture taping; step 2 of urge suppression (based on internal or external PFM assessment)    PT Home Exercise Plan  sit<>stand edu; log rolling edu; standing R side stretch; open book to L; scar mobilization with rotation and with pelvic tilts; log rolling education; posture education; diaphragmatic breathing for part 1 of urge suppression; scapular retraction and depression    Consulted and Agree with Plan of Care  Patient       Patient will benefit from skilled therapeutic intervention in order to improve the following deficits and impairments:  Abnormal gait, Hypomobility, Difficulty walking, Increased muscle spasms, Improper body mechanics, Decreased scar mobility, Decreased coordination, Decreased activity tolerance, Decreased strength, Postural dysfunction, Decreased endurance, Decreased balance  Visit Diagnosis: Other abnormalities of gait and mobility  Other lack of coordination  Muscle weakness (generalized)  Unequal leg length     Problem List Patient Active Problem List   Diagnosis Date Noted  . Ataxia 02/04/2017  . Sinus bradycardia 02/04/2017  . Dizziness 01/21/2017  . History of stroke 01/21/2017  . Constipation 08/04/2012  . Unspecified hypothyroidism 07/31/2012  . Obstructive sleep apnea 07/31/2012  . E. coli UTI (urinary tract infection), fluoroquinolone resistant 07/26/2012  . Essential hypertension, benign 07/26/2012  . Ileus, postoperative ([place]) 07/24/2012  . Hyponatremia 07/24/2012  . Arthritis of knee, left 07/21/2012    Tomasita Morrow, SPT  10/27/2018, 11:29 AM  [place] MAIN [facility] SERVICES [address] [place], [place], [address] Phone: [phone]   Fax:  [phone]  Name: Lori Wiggins MRN: [id number] Date of Birth: 1928/03/29

## 2018-10-27 NOTE — Patient Instructions (Signed)
Belly Breathing (to enter the relaxation state "rest and digest")  1. Breath in through your nose, belly should expand  2. Breath out through your mouth, belly flattens down.   **Important to get really good at this, while you are on your way to the bathroom because it is the first step in reducing your bladder incontinence   Stabilization: Diaphragmatic Breathing    Lie with knees bent, feet flat. Place one hand on stomach, other on chest. Breathe deeply through nose, lifting belly hand without any motion of hand on chest. Repeat ___10_ times per set. Do _3___ sets per session. Perform every day, twice a day.    For side-lying rotation stretch- open up towards the L side.     Seated Posture- to help decreased middle back pain  1. Bring pillows behind you while sitting in your leather couch  2. When you have pain in your back- bring shoulders down and back (reminds you to have good posture) -- see picture below.

## 2018-11-03 ENCOUNTER — Ambulatory Visit: Payer: PPO | Attending: Physician Assistant

## 2018-11-03 DIAGNOSIS — M6281 Muscle weakness (generalized): Secondary | ICD-10-CM | POA: Insufficient documentation

## 2018-11-03 DIAGNOSIS — M2170 Unequal limb length (acquired), unspecified site: Secondary | ICD-10-CM | POA: Insufficient documentation

## 2018-11-03 DIAGNOSIS — R2689 Other abnormalities of gait and mobility: Secondary | ICD-10-CM | POA: Insufficient documentation

## 2018-11-03 DIAGNOSIS — R278 Other lack of coordination: Secondary | ICD-10-CM | POA: Insufficient documentation

## 2018-11-04 ENCOUNTER — Encounter: Payer: PPO | Admitting: Physical Therapy

## 2018-11-10 ENCOUNTER — Ambulatory Visit: Payer: PPO

## 2018-11-10 ENCOUNTER — Other Ambulatory Visit: Payer: Self-pay

## 2018-11-10 DIAGNOSIS — R278 Other lack of coordination: Secondary | ICD-10-CM

## 2018-11-10 DIAGNOSIS — M2170 Unequal limb length (acquired), unspecified site: Secondary | ICD-10-CM

## 2018-11-10 DIAGNOSIS — R2689 Other abnormalities of gait and mobility: Secondary | ICD-10-CM

## 2018-11-10 DIAGNOSIS — M6281 Muscle weakness (generalized): Secondary | ICD-10-CM | POA: Diagnosis not present

## 2018-11-10 NOTE — Patient Instructions (Signed)
Pelvic Tilt With Pelvic Floor (Hook-Lying) (do in place of just belly breathing)         Lie with hips and knees bent. Breath in, belly rises, while breathing out so that back flattens towards the table; abs turn on. Repeat _10 belly breaths. Do __2-3_ times a day.  Put a pillow under your hips in this position when doing this exercise to help decrease pressure in the pelvis when it feels "heavy".

## 2018-11-10 NOTE — Therapy (Signed)
[place] MAIN [facility] SERVICES [address] [place], [place], [address] Phone: [phone]   Fax:  [phone]  Physical Therapy Treatment The patient has been informed of current processes in place at Outpatient Rehab to protect patients from Covid-19 exposure including social distancing, schedule modifications, and new cleaning procedures. After discussing their particular risk with a therapist based on the patient's personal risk factors, the patient has decided to proceed with in-person therapy.  Patient Details  Name: Lori Wiggins MRN: [id number] Date of Birth: 06/10/1928 Referring Provider (PT): Vaillancourt   Encounter Date: 11/10/2018  PT End of Session - 11/10/18 1122    Visit Number  4    Number of Visits  10    Date for PT Re-Evaluation  12/23/18    Authorization - Visit Number  4    Authorization - Number of Visits  10    PT Start Time  1000    PT Stop Time  1100    PT Time Calculation (min)  60 min    Activity Tolerance  Patient tolerated treatment well;No increased pain    Behavior During Therapy  WFL for tasks assessed/performed       Past Medical History:  Diagnosis Date  . Arthritis   . Breast cancer ([place]) 2012   left  breast  . Equilibrium disorder   . GERD (gastroesophageal reflux disease)   . Hypertension   . Hypothyroidism   . Personal history of radiation therapy   . Sleep apnea    uses CPAP    Past Surgical History:  Procedure Laterality Date  . ABDOMINAL HYSTERECTOMY    . APPENDECTOMY    . BOTOX INJECTION N/A 09/22/2018   Procedure: Bladder BOTOX INJECTION;  Surgeon: Hollice Espy, MD;  Location: [facility] ORS;  Service: Urology;  Laterality: N/A;  General vs MAC for anesthesia  . BREAST BIOPSY Left 2012   Positive  . BREAST EXCISIONAL BIOPSY Right    at age 16's  . BREAST LUMPECTOMY Left 2012   F/U radiation   . BREAST LUMPECTOMY WITH AXILLARY LYMPH NODE BIOPSY Left 2012   with Radiation  . BREAST  SURGERY Left 2012   Lumpectomy  . CARDIAC CATHETERIZATION     10 years ago  . CATARACT EXTRACTION W/ INTRAOCULAR LENS  IMPLANT, BILATERAL Bilateral   . COLONOSCOPY    . JOINT REPLACEMENT    . TONSILLECTOMY    . TOTAL KNEE ARTHROPLASTY Left 07/21/2012   Dr Mayer Camel  . TOTAL KNEE ARTHROPLASTY Left 07/21/2012   Procedure: TOTAL KNEE ARTHROPLASTY;  Surgeon: Kerin Salen, MD;  Location: [place];  Service: Orthopedics;  Laterality: Left;    There were no vitals filed for this visit.    Pelvic Floor Physical Therapy Treatment Note  SCREENING  Changes in medications, allergies, or medical history?: No    SUBJECTIVE  Patient reports: Patient is frustrated about computer scheduling communication. Has been very dedicated to HEP. Has a question on one exercise. Has been sleeping with a pillow between her knees- has been helpful. Report urgency with belly breathing has decreased significantly, can still have leakage, however it has been significantly different to patient.   Precautions:  Sleep apnea (CPAP); previous acute cystitis with hematuria; breast CA survivor (2012); L TKA (2014)   Pain update:  Location of pain: R foot pain ("from the rain")  Current pain:  <1/10  Max pain:  3/10 - it had a hard time walking with it Least pain:  0/10 Nature of pain: feels stiff  -- occasional mid back pain when she gets up; decreases with scapular retraction/depression.   Patient Goals: To be able to participate in social activities without fear of not making it to the bathroom.    OBJECTIVE  Changes in: Posture/Observations:  Forward head posture; thoracic kyphosis present; anterior pelvic tilt.   Range of Motion/Flexibilty:  Only about 25% of thoracic rotation.  Decreased time for supine to side-lying; supine<>sit log rolling  Abdominal:  Decreased fascial mobility along scar- improved since eval.   Palpation: TTP at paraspinals along T/L junction  TTP at L4-L5 paraspinals   Gait  Analysis: New- SPC - corrected size this session; decreased forward lean present.   INTERVENTIONS THIS SESSION: Manual therapy: Myofascial release, followed by oscillations at T10-L2 L>R, lumbar paraspinals at L4-5 L>R, decreased fascial tension following, no low back pain with posterior pelvic tilts following therapy. L QL release grades I/II to decrease posterior pelvic tilt pain. Myofascial release along B adductors, with TP release at gracilis, bilaterally to decrease adductor activation during pelvic floor contractions. Assessment of external pelvic floor musculature today- 3/5 muscle contraction, however delayed activation, proceeding adductor muscle activation, and decreased breathing coordination. (35 minutes)   Therex: Hook-lying posterior pelvic tilts- with tactile cues for coordination with breathing. Pt has preference for paradoxical TA activation. Attempted posterior pelvic tilts in side-lying- decreased low back pain following manual therapy. Attempted Keg[place] in [place], regressed to hook-lying posterior pelvic tilts with TA engagement. Balloon analogy for breathing in relationship to pelvic floor muscles given for improved TA engagement coordination. (25 minutes)    Total Intervention time: 60 minutes                           PT Long Term Goals - 10/20/18 1056      PT LONG TERM GOAL #1   Title  Pt will demo IND with HEP    Time  10    Period  Weeks    Status  On-going    Target Date  12/29/18      PT LONG TERM GOAL #2   Title  Pt will demo deep core coordination and proper technique for level 1 and 2 exercises to promote intraabdominal pressure system for continence    Time  4    Period  Weeks    Status  On-going    Target Date  11/17/18      PT LONG TERM GOAL #3   Title  Pt will increase gait speed from 0.40ms to > 0.8 m/s in order to make it to the bathroom without leakage    Baseline  10-20-18 (2nd visit) 0.78m (good carry over from last  visit).    Time  10    Period  Weeks    Status  On-going    Target Date  12/29/18      PT LONG TERM GOAL #4   Title  Pt will demo decreased abdominal scar restrictions in order to optimize TrA co-activation with diahphragm and pelvic floor to gain continence    Baseline  10-20-18- decreased abdominal scar restrictions post manual treatment.    Time  4    Period  Weeks    Status  On-going    Target Date  11/17/18      PT LONG TERM GOAL #5   Title  Pt wil demo less gait deviations ( R trunk lean 2/2 lumbar scoliotic curve) with compliance  wearing shoe lift in order to adjust for leg length difference and to walk with longer stride length    Baseline  10-20-18 Pt demonstrated increased upright posture, and reports compliance with heel lift.    Time  2    Period  Weeks    Status  On-going    Target Date  11/03/18      PT LONG TERM GOAL #6   Title  Pt will report decreasing use of pull up pad from 2 x day to < 1 x day in order to improve QOL    Time  10    Period  Weeks    Status  On-going    Target Date  12/29/18            Plan - 11/10/18 1123    Clinical Impression Statement  Pt. Responded well to all interventions today, demonstrating improved pelvic tilt coordination with decreased paradoxical TA activation, good diaphragmatic breathing carry over between sessio, decreased low back pain with posterior pelvic tilts, as well as understanding and correct performance of all education and exercises provided today. They will continue to benefit from skilled physical therapy to work toward remaining goals and maximize function as well as decrease likelihood of symptom increase or recurrence.    Personal Factors and Comorbidities  Age    Examination-Activity Limitations  Continence;Toileting    Stability/Clinical Decision Making  Evolving/Moderate complexity    Rehab Potential  Good    PT Frequency  1x / week    PT Duration  Other (comment)   10   PT Treatment/Interventions   Gait training;Therapeutic exercise;Balance training;Neuromuscular re-education;Therapeutic activities;Manual lymph drainage;Manual techniques;Scar mobilization;Moist Heat;Spinal Manipulations;Patient/family education;Passive range of motion;Stair training    PT Next Visit Plan  reassess scar mobility,  scapular mobility, QL involvement; open up R side; increased proprioception of pelvic tilts; increase thoracic mobility; **posture taping; step 2 of urge suppression (based on internal or external PFM assessment)-- address hip adductor TPs for improved pelvic floor engagment.    PT Home Exercise Plan  sit<>stand edu; log rolling edu; standing R side stretch; open book to L; scar mobilization with rotation and with pelvic tilts; log rolling education; posture education; diaphragmatic breathing for part 1 of urge suppression; scapular retraction and depression    Consulted and Agree with Plan of Care  Patient       Patient will benefit from skilled therapeutic intervention in order to improve the following deficits and impairments:  Abnormal gait, Hypomobility, Difficulty walking, Increased muscle spasms, Improper body mechanics, Decreased scar mobility, Decreased coordination, Decreased activity tolerance, Decreased strength, Postural dysfunction, Decreased endurance, Decreased balance  Visit Diagnosis: Other abnormalities of gait and mobility  Other lack of coordination  Muscle weakness (generalized)  Unequal leg length     Problem List Patient Active Problem List   Diagnosis Date Noted  . Ataxia 02/04/2017  . Sinus bradycardia 02/04/2017  . Dizziness 01/21/2017  . History of stroke 01/21/2017  . Constipation 08/04/2012  . Unspecified hypothyroidism 07/31/2012  . Obstructive sleep apnea 07/31/2012  . E. coli UTI (urinary tract infection), fluoroquinolone resistant 07/26/2012  . Essential hypertension, benign 07/26/2012  . Ileus, postoperative ([place]) 07/24/2012  . Hyponatremia  07/24/2012  . Arthritis of knee, left 07/21/2012   Aubria Vanecek, SPT  Willa Rough 11/10/2018, 11:35 AM  [place] MAIN [facility] SERVICES [address] [place], [place], [address] Phone: [phone]   Fax:  [phone]  Name: Lori Wiggins MRN: [id number]  Date of Birth: [birth year]

## 2018-11-11 ENCOUNTER — Encounter: Payer: PPO | Admitting: Physical Therapy

## 2018-11-17 ENCOUNTER — Ambulatory Visit: Payer: PPO

## 2018-11-17 ENCOUNTER — Other Ambulatory Visit: Payer: Self-pay

## 2018-11-17 DIAGNOSIS — M6281 Muscle weakness (generalized): Secondary | ICD-10-CM

## 2018-11-17 DIAGNOSIS — M2170 Unequal limb length (acquired), unspecified site: Secondary | ICD-10-CM

## 2018-11-17 DIAGNOSIS — R2689 Other abnormalities of gait and mobility: Secondary | ICD-10-CM | POA: Diagnosis not present

## 2018-11-17 DIAGNOSIS — R278 Other lack of coordination: Secondary | ICD-10-CM

## 2018-11-17 NOTE — Therapy (Signed)
[place] MAIN [facility] SERVICES [address] [place], [place], [address] Phone: [phone]   Fax:  [phone]  Physical Therapy Treatment The patient has been informed of current processes in place at Outpatient Rehab to protect patients from Covid-19 exposure including social distancing, schedule modifications, and new cleaning procedures. After discussing their particular risk with a therapist based on the patient's personal risk factors, the patient has decided to proceed with in-person therapy.  Patient Details  Name: Lori Wiggins MRN: [id number] Date of Birth: January 18, 1928 Referring Provider (PT): Vaillancourt   Encounter Date: 11/17/2018  PT End of Session - 11/17/18 1107    Visit Number  5    Number of Visits  10    Date for PT Re-Evaluation  12/23/18    Authorization - Visit Number  5    Authorization - Number of Visits  10    PT Start Time  1005    PT Stop Time  7628    PT Time Calculation (min)  60 min    Activity Tolerance  Patient tolerated treatment well;No increased pain    Behavior During Therapy  WFL for tasks assessed/performed       Past Medical History:  Diagnosis Date  . Arthritis   . Breast cancer ([place]) 2012   left  breast  . Equilibrium disorder   . GERD (gastroesophageal reflux disease)   . Hypertension   . Hypothyroidism   . Personal history of radiation therapy   . Sleep apnea    uses CPAP    Past Surgical History:  Procedure Laterality Date  . ABDOMINAL HYSTERECTOMY    . APPENDECTOMY    . BOTOX INJECTION N/A 09/22/2018   Procedure: Bladder BOTOX INJECTION;  Surgeon: Hollice Espy, MD;  Location: [facility] ORS;  Service: Urology;  Laterality: N/A;  General vs MAC for anesthesia  . BREAST BIOPSY Left 2012   Positive  . BREAST EXCISIONAL BIOPSY Right    at age 50's  . BREAST LUMPECTOMY Left 2012   F/U radiation   . BREAST LUMPECTOMY WITH AXILLARY LYMPH NODE BIOPSY Left 2012   with Radiation  .  BREAST SURGERY Left 2012   Lumpectomy  . CARDIAC CATHETERIZATION     10 years ago  . CATARACT EXTRACTION W/ INTRAOCULAR LENS  IMPLANT, BILATERAL Bilateral   . COLONOSCOPY    . JOINT REPLACEMENT    . TONSILLECTOMY    . TOTAL KNEE ARTHROPLASTY Left 07/21/2012   Dr Mayer Camel  . TOTAL KNEE ARTHROPLASTY Left 07/21/2012   Procedure: TOTAL KNEE ARTHROPLASTY;  Surgeon: Kerin Salen, MD;  Location: [place];  Service: Orthopedics;  Laterality: Left;    There were no vitals filed for this visit.   Pelvic Floor Physical Therapy Treatment Note  SCREENING  Changes in medications, allergies, or medical history?: No    SUBJECTIVE  Patient reports: Breathing has continued to help decrease urgency while going to the bathroom. Patient reports high fear of hurting back with abdominal engagement training. Patient reports that "I was home bound all Friday night", due to high urinary voiding times after practicing posterior pelvic tilts in hook-lying-pt reports fear of doing it wrong. Patient shares stress at home due to [facility], and therefore has been driving more.   Precautions:  Sleep apnea (CPAP); previous acute cystitis with hematuria; breast CA survivor (2012); L TKA (2014)   Pain update:  Location of pain: Not assessed this session  Current pain:  N/A /10  Max pain:  N/A/10 Least pain:  0/10 Nature of pain:  -- Pt reports occasional mid back pain when she gets up; decreases with scapular retraction/depression; fear of low back pain during transfers   Patient Goals: To be able to participate in social activities without fear of not making it to the bathroom.    OBJECTIVE  Changes in: Posture/Observations:  Forward head posture; thoracic kyphosis present; anterior pelvic tilt.   Range of Motion/Flexibilty:  Only about 25% of thoracic rotation.  Increased time for supine to side-lying; supine<>sit log rolling  Abdominal:  Decreased fascial mobility along scar- improved since  eval.   Palpation: Not assessed this session   Gait Analysis: No SPC this session, decreased hip extension, short stride, anterior pelvic tilt with forward lean, decreased foot clearance- simillar to shuffling gait.   INTERVENTIONS THIS SESSION: Therex:  Most of session spent practicing the following due to foundational movement affecting patients symptoms and delayed coordination performance. Hook-lying posterior pelvic tilts- pt initially demonstrated anterior pelvic tilt during inhalation with gluteal activation, and decreased mobility with posterior pelvic tilt. Patient had improved mobility with tactile and visual cues. Patient was most effective in decreased gluteal activation with tactile/ visual cues of BP cuff under low back. Video taken on patient's phone and written directions with personalized cues "breath in- fully relax buttocks; breath out low back flattens to the bed first". (50 minutes)   Self Care: Relationship of posterior pelvic tilts and diaphragmatic breathing to symptoms and plan of care. Re-education on decreasing intraabdominal pressure with functional mobility. (10 minutes)   Total Intervention time: 60 minutes                                PT Long Term Goals - 10/20/18 1056      PT LONG TERM GOAL #1   Title  Pt will demo IND with HEP    Time  10    Period  Weeks    Status  On-going    Target Date  12/29/18      PT LONG TERM GOAL #2   Title  Pt will demo deep core coordination and proper technique for level 1 and 2 exercises to promote intraabdominal pressure system for continence    Time  4    Period  Weeks    Status  On-going    Target Date  11/17/18      PT LONG TERM GOAL #3   Title  Pt will increase gait speed from 0.5ms to > 0.8 m/s in order to make it to the bathroom without leakage    Baseline  10-20-18 (2nd visit) 0.737m (good carry over from last visit).    Time  10    Period  Weeks    Status  On-going    Target  Date  12/29/18      PT LONG TERM GOAL #4   Title  Pt will demo decreased abdominal scar restrictions in order to optimize TrA co-activation with diahphragm and pelvic floor to gain continence    Baseline  10-20-18- decreased abdominal scar restrictions post manual treatment.    Time  4    Period  Weeks    Status  On-going    Target Date  11/17/18      PT LONG TERM GOAL #5   Title  Pt wil demo less gait deviations ( R trunk lean 2/2 lumbar scoliotic curve) with compliance wearing shoe lift in  order to adjust for leg length difference and to walk with longer stride length    Baseline  10-20-18 Pt demonstrated increased upright posture, and reports compliance with heel lift.    Time  2    Period  Weeks    Status  On-going    Target Date  11/03/18      PT LONG TERM GOAL #6   Title  Pt will report decreasing use of pull up pad from 2 x day to < 1 x day in order to improve QOL    Time  10    Period  Weeks    Status  On-going    Target Date  12/29/18            Plan - 11/17/18 1107    Clinical Impression Statement  Pt. Responded well to all interventions today, demonstrating improved posterior pelvic tilts with diaphragmatic breathing coordination,  as well as understanding and correct performance of all education and exercises provided today. They will continue to benefit from skilled physical therapy to work toward remaining goals and maximize function as well as decrease likelihood of symptom increase or recurrence.    Personal Factors and Comorbidities  Age    Examination-Activity Limitations  Continence;Toileting    Stability/Clinical Decision Making  Evolving/Moderate complexity    Rehab Potential  Good    PT Frequency  1x / week    PT Duration  Other (comment)   10   PT Treatment/Interventions  Gait training;Therapeutic exercise;Balance training;Neuromuscular re-education;Therapeutic activities;Manual lymph drainage;Manual techniques;Scar mobilization;Moist Heat;Spinal  Manipulations;Patient/family education;Passive range of motion;Stair training    PT Next Visit Plan  reassess scar mobility,  scapular mobility, QL involvement; open up R side; increased proprioception of pelvic tilts; increase thoracic mobility; **posture taping; step 2 of urge suppression (based on internal or external PFM assessment)-- address hip adductor TPs for improved pelvic floor engagment.    PT Home Exercise Plan  sit<>stand edu; log rolling edu; standing R side stretch; open book to L; scar mobilization with rotation and with pelvic tilts; posture education; diaphragmatic breathing for part 1 of urge suppression; scapular retraction and depression; hook-lying posterior pelvic tilts (cues for coordination)    Consulted and Agree with Plan of Care  Patient       Patient will benefit from skilled therapeutic intervention in order to improve the following deficits and impairments:  Abnormal gait, Hypomobility, Difficulty walking, Increased muscle spasms, Improper body mechanics, Decreased scar mobility, Decreased coordination, Decreased activity tolerance, Decreased strength, Postural dysfunction, Decreased endurance, Decreased balance  Visit Diagnosis: Other abnormalities of gait and mobility  Other lack of coordination  Muscle weakness (generalized)  Unequal leg length     Problem List Patient Active Problem List   Diagnosis Date Noted  . Ataxia 02/04/2017  . Sinus bradycardia 02/04/2017  . Dizziness 01/21/2017  . History of stroke 01/21/2017  . Constipation 08/04/2012  . Unspecified hypothyroidism 07/31/2012  . Obstructive sleep apnea 07/31/2012  . E. coli UTI (urinary tract infection), fluoroquinolone resistant 07/26/2012  . Essential hypertension, benign 07/26/2012  . Ileus, postoperative ([place]) 07/24/2012  . Hyponatremia 07/24/2012  . Arthritis of knee, left 07/21/2012   Tomasita Morrow, SPT  Mirage Pfefferkorn 11/17/2018, 11:24 AM  [place] MAIN [facility] SERVICES [address] [place], [place], [address] Phone: [phone]   Fax:  [phone]  Name: Lori Wiggins MRN: [id number] Date of Birth: [birth year]

## 2018-11-17 NOTE — Patient Instructions (Signed)
Getting In/out of bed - breath out as you go.     Lying on back, bend left knee and place left arm across chest. Roll all in one movement to the right. Reverse to roll to the left. Always move as one unit. Breath out as you go.      Once you are lying on you side, move legs to edge of bed. Breath out, and push down with both hands while moving legs off bed to reach sitting position.  *Reverse sequence to return to lying down.  Copyright  VHI. All rights reserved.

## 2018-11-18 ENCOUNTER — Encounter: Payer: PPO | Admitting: Physical Therapy

## 2018-11-19 ENCOUNTER — Ambulatory Visit: Payer: PPO | Admitting: Podiatry

## 2018-11-19 ENCOUNTER — Encounter: Payer: PPO | Admitting: Physical Therapy

## 2018-11-24 ENCOUNTER — Other Ambulatory Visit: Payer: Self-pay

## 2018-11-24 ENCOUNTER — Ambulatory Visit: Payer: PPO | Admitting: Podiatry

## 2018-11-24 ENCOUNTER — Encounter: Payer: Self-pay | Admitting: Podiatry

## 2018-11-24 DIAGNOSIS — B351 Tinea unguium: Secondary | ICD-10-CM | POA: Diagnosis not present

## 2018-11-24 DIAGNOSIS — M79676 Pain in unspecified toe(s): Secondary | ICD-10-CM

## 2018-11-24 NOTE — Progress Notes (Signed)
She presents today chief complaint of painfully elongated toenails.  Denies fever chills nausea vomiting muscle aches and pains denies open lesions or wounds.  Objective: Toenails are long thick yellow dystrophic-like mycotic.  She has some eczematous type tissue proximal nail fold third toe left foot.  No open lesions or wounds currently.  Assessment: Pain limb secondary to onychomycosis.  Plan: Debridement of toenails 1 through 5.

## 2018-11-25 ENCOUNTER — Ambulatory Visit: Payer: PPO

## 2018-11-25 DIAGNOSIS — R2689 Other abnormalities of gait and mobility: Secondary | ICD-10-CM | POA: Diagnosis not present

## 2018-11-25 DIAGNOSIS — R42 Dizziness and giddiness: Secondary | ICD-10-CM | POA: Diagnosis not present

## 2018-11-25 DIAGNOSIS — H6121 Impacted cerumen, right ear: Secondary | ICD-10-CM | POA: Diagnosis not present

## 2018-11-25 DIAGNOSIS — M6281 Muscle weakness (generalized): Secondary | ICD-10-CM

## 2018-11-25 DIAGNOSIS — M2170 Unequal limb length (acquired), unspecified site: Secondary | ICD-10-CM

## 2018-11-25 DIAGNOSIS — R278 Other lack of coordination: Secondary | ICD-10-CM

## 2018-11-25 DIAGNOSIS — G4700 Insomnia, unspecified: Secondary | ICD-10-CM | POA: Diagnosis not present

## 2018-11-25 DIAGNOSIS — J330 Polyp of nasal cavity: Secondary | ICD-10-CM | POA: Diagnosis not present

## 2018-11-25 DIAGNOSIS — M810 Age-related osteoporosis without current pathological fracture: Secondary | ICD-10-CM | POA: Diagnosis not present

## 2018-11-25 DIAGNOSIS — G4733 Obstructive sleep apnea (adult) (pediatric): Secondary | ICD-10-CM | POA: Diagnosis not present

## 2018-11-25 DIAGNOSIS — J309 Allergic rhinitis, unspecified: Secondary | ICD-10-CM | POA: Diagnosis not present

## 2018-11-25 NOTE — Patient Instructions (Signed)
  Pelvic Tilt With Pelvic Floor (Hook-Lying)        Lie with hips and knees bent. Squeeze pelvic floor and flatten low back while breathing out so that pelvis tilts. Repeat _2x10__ times. Do _1-3__ times a day.  Put a pillow under your hips in this position when doing this exercise to help decrease pressure in the pelvis when it feels "heavy".  **Special Cue: as you breathe out, feel the low back flatten, and the tailbone leave the bed.

## 2018-11-25 NOTE — Therapy (Addendum)
[place] MAIN [facility] SERVICES [address] [place], [place], [address] Phone: [phone]   Fax:  [phone]  Physical Therapy Treatment  The patient has been informed of current processes in place at Outpatient Rehab to protect patients from Covid-19 exposure including social distancing, schedule modifications, and new cleaning procedures. After discussing their particular risk with a therapist based on the patient's personal risk factors, the patient has decided to proceed with in-person therapy.   Patient Details  Name: Lori Wiggins MRN: [id number] Date of Birth: [birth year] Referring Provider (PT): Vaillancourt   Encounter Date: 11/25/2018  PT End of Session - 12/01/18 0810    Visit Number  6    Number of Visits  10    Date for PT Re-Evaluation  12/23/18    Authorization - Visit Number  6    Authorization - Number of Visits  10    PT Start Time  1237    PT Stop Time  1330    PT Time Calculation (min)  53 min    Activity Tolerance  Patient tolerated treatment well;No increased pain    Behavior During Therapy  WFL for tasks assessed/performed       Past Medical History:  Diagnosis Date  . Arthritis   . Breast cancer ([place]) 2012   left  breast  . Equilibrium disorder   . GERD (gastroesophageal reflux disease)   . Hypertension   . Hypothyroidism   . Personal history of radiation therapy   . Sleep apnea    uses CPAP    Past Surgical History:  Procedure Laterality Date  . ABDOMINAL HYSTERECTOMY    . APPENDECTOMY    . BOTOX INJECTION N/A 09/22/2018   Procedure: Bladder BOTOX INJECTION;  Surgeon: Hollice Espy, MD;  Location: [facility];  Service: Urology;  Laterality: N/A;  General vs MAC for anesthesia  . BREAST BIOPSY Left 2012   Positive  . BREAST EXCISIONAL BIOPSY Right    at age 43's  . BREAST LUMPECTOMY Left 2012   F/U radiation   . BREAST LUMPECTOMY WITH AXILLARY LYMPH NODE BIOPSY Left 2012   with Radiation  .  BREAST SURGERY Left 2012   Lumpectomy  . CARDIAC CATHETERIZATION     10 years ago  . CATARACT EXTRACTION W/ INTRAOCULAR LENS  IMPLANT, BILATERAL Bilateral   . COLONOSCOPY    . JOINT REPLACEMENT    . TONSILLECTOMY    . TOTAL KNEE ARTHROPLASTY Left 07/21/2012   Dr Mayer Camel  . TOTAL KNEE ARTHROPLASTY Left 07/21/2012   Procedure: TOTAL KNEE ARTHROPLASTY;  Surgeon: Kerin Salen, MD;  Location: [place];  Service: Orthopedics;  Laterality: Left;    There were no vitals filed for this visit.   Pelvic Floor Physical Therapy Treatment Note  SCREENING  Changes in medications, allergies, or medical history?: No    SUBJECTIVE  Patient reports: She is not sure she is doing her exercises right but she is doing some better, she is not doing worse. She has been doing breathing and scar release, then posterior pelvic tilts, then "open-door", doing the breathing is helping get her almost all the way there but she has not pulled her pants down yet (only ~ 2% of the time she is making it all the way) feels this is a big improvement.   Precautions:  Sleep apnea (CPAP); previous acute cystitis with hematuria; breast CA survivor (2012); L TKA (2014)   Pain update:  Location of pain: Not assessed this  session  Current pain:  N/A /10  Max pain:  N/A/10 Least pain:  0/10 Nature of pain:  -- Pt reports occasional mid back pain when she gets up; decreases with scapular retraction/depression; fear of low back pain during transfers   Patient Goals: To be able to participate in social activities without fear of not making it to the bathroom.    OBJECTIVE  Changes in: Posture/Observations:  Forward head posture; thoracic kyphosis present; anterior pelvic tilt.   Range of Motion/Flexibilty:  Decreased lumbar mobility  Abdominal:  (from previous session) Decreased fascial mobility along scar- improved since eval.   Palpation: Not assessed this session   Gait Analysis: decreased hip extension,  short stride, anterior pelvic tilt with forward lean, decreased foot clearance- simillar to shuffling gait.   INTERVENTIONS THIS SESSION: Therex:  Reviewed posterior pelvic tilts with TC, VC (focus on how much pressure is or is not on tailbone), and maximal practice to instill confidence in Pt's ability to perform correctly at home for maximal efficacy.  Manual: . Performed grade 3-4 PA mobs in L side-lying to improve lumbar mobility, decrease pressure on nerve roots, and increase ability to sense and coordinate musculature.   Total Intervention time: 60 minutes                               PT Long Term Goals - 10/20/18 1056      PT LONG TERM GOAL #1   Title  Pt will demo IND with HEP    Time  10    Period  Weeks    Status  On-going    Target Date  12/29/18      PT LONG TERM GOAL #2   Title  Pt will demo deep core coordination and proper technique for level 1 and 2 exercises to promote intraabdominal pressure system for continence    Time  4    Period  Weeks    Status  On-going    Target Date  11/17/18      PT LONG TERM GOAL #3   Title  Pt will increase gait speed from 0.43ms to > 0.8 m/s in order to make it to the bathroom without leakage    Baseline  10-20-18 (2nd visit) 0.728m (good carry over from last visit).    Time  10    Period  Weeks    Status  On-going    Target Date  12/29/18      PT LONG TERM GOAL #4   Title  Pt will demo decreased abdominal scar restrictions in order to optimize TrA co-activation with diahphragm and pelvic floor to gain continence    Baseline  10-20-18- decreased abdominal scar restrictions post manual treatment.    Time  4    Period  Weeks    Status  On-going    Target Date  11/17/18      PT LONG TERM GOAL #5   Title  Pt wil demo less gait deviations ( R trunk lean 2/2 lumbar scoliotic curve) with compliance wearing shoe lift in order to adjust for leg length difference and to walk with longer stride length     Baseline  10-20-18 Pt demonstrated increased upright posture, and reports compliance with heel lift.    Time  2    Period  Weeks    Status  On-going    Target Date  11/03/18      PT LONG  TERM GOAL #6   Title  Pt will report decreasing use of pull up pad from 2 x day to < 1 x day in order to improve QOL    Time  10    Period  Weeks    Status  On-going    Target Date  12/29/18            Plan - 12/01/18 7915    Clinical Impression Statement  Pt. Responded well to all interventions today, demonstrating improved lumbar mobility and improved proprioception and coordination and therefore appropriate performance of her HEP without cueing. They will continue to benefit from skilled physical therapy to work toward remaining goals and maximize function as well as decrease likelihood of symptom increase or recurrence.     Personal Factors and Comorbidities  Age    Examination-Activity Limitations  Continence;Toileting    Stability/Clinical Decision Making  Evolving/Moderate complexity    Clinical Decision Making  Moderate    Rehab Potential  Good    PT Frequency  1x / week    PT Duration  Other (comment)   10   PT Treatment/Interventions  Gait training;Therapeutic exercise;Balance training;Neuromuscular re-education;Therapeutic activities;Manual lymph drainage;Manual techniques;Scar mobilization;Moist Heat;Spinal Manipulations;Patient/family education;Passive range of motion;Stair training    PT Next Visit Plan  reassess scar mobility,  scapular mobility, QL involvement; open up R side; increased proprioception of pelvic tilts; increase thoracic mobility; **posture taping; step 2 of urge suppression (based on internal or external PFM assessment)-- address hip adductor TPs for improved pelvic floor engagment.    PT Home Exercise Plan  sit<>stand edu; log rolling edu; standing R side stretch; open book to L; scar mobilization with rotation and with pelvic tilts; posture education; diaphragmatic  breathing for part 1 of urge suppression; scapular retraction and depression; hook-lying posterior pelvic tilts (cues for coordination)    Consulted and Agree with Plan of Care  Patient       Patient will benefit from skilled therapeutic intervention in order to improve the following deficits and impairments:  Abnormal gait, Hypomobility, Difficulty walking, Increased muscle spasms, Improper body mechanics, Decreased scar mobility, Decreased coordination, Decreased activity tolerance, Decreased strength, Postural dysfunction, Decreased endurance, Decreased balance  Visit Diagnosis: Other abnormalities of gait and mobility  Other lack of coordination  Muscle weakness (generalized)  Unequal leg length     Problem List Patient Active Problem List   Diagnosis Date Noted  . Ataxia 02/04/2017  . Sinus bradycardia 02/04/2017  . Dizziness 01/21/2017  . History of stroke 01/21/2017  . Constipation 08/04/2012  . Unspecified hypothyroidism 07/31/2012  . Obstructive sleep apnea 07/31/2012  . E. coli UTI (urinary tract infection), fluoroquinolone resistant 07/26/2012  . Essential hypertension, benign 07/26/2012  . Ileus, postoperative ([place]) 07/24/2012  . Hyponatremia 07/24/2012  . Arthritis of knee, left 07/21/2012   Willa Rough DPT, ATC Willa Rough 12/01/2018, 8:23 AM  [place] MAIN [facility] SERVICES [address] [place], [place], [address] Phone: [phone]   Fax:  [phone]  Name: Lori Wiggins MRN: [id number] Date of Birth: 1928/07/14

## 2018-12-01 ENCOUNTER — Encounter: Payer: PPO | Admitting: Physical Therapy

## 2018-12-03 ENCOUNTER — Ambulatory Visit: Payer: PPO | Attending: Physician Assistant | Admitting: Physical Therapy

## 2018-12-03 ENCOUNTER — Other Ambulatory Visit: Payer: Self-pay

## 2018-12-03 DIAGNOSIS — R2689 Other abnormalities of gait and mobility: Secondary | ICD-10-CM | POA: Diagnosis not present

## 2018-12-03 DIAGNOSIS — R278 Other lack of coordination: Secondary | ICD-10-CM | POA: Insufficient documentation

## 2018-12-03 DIAGNOSIS — M2170 Unequal limb length (acquired), unspecified site: Secondary | ICD-10-CM | POA: Diagnosis not present

## 2018-12-03 DIAGNOSIS — M6281 Muscle weakness (generalized): Secondary | ICD-10-CM | POA: Insufficient documentation

## 2018-12-03 NOTE — Patient Instructions (Addendum)
1) Open book  Drag arm forearm and relax onto the pillow behind back  15 rep  2) Angel wings 15 reps   3)     band under ballmounds  while laying on back w/ knees bent  "W" exercise  15 reps    Band is placed under feet, knees bent, feet are hip width apart Hold band with thumbs point out, keep upper arm and elbow touching the bed the whole time  - inhale and then exhale pull bands by bending elbows hands move in a "w"  (feel shoulder blades squeezing)    ___ 4)  pelvic tilts 15 reps

## 2018-12-04 NOTE — Therapy (Signed)
[place] MAIN [facility] SERVICES [address] [place], [place], [address] Phone: [phone]   Fax:  [phone]  Physical Therapy Treatment  Patient Details  Name: Lori Wiggins MRN: [id number] Date of Birth: 1928-05-23 Referring Provider (PT): Vaillancourt   Encounter Date: 12/03/2018  PT End of Session - 12/04/18 0939    Visit Number  7    Number of Visits  10    Date for PT Re-Evaluation  12/23/18    Authorization - Visit Number  7    Authorization - Number of Visits  10    PT Start Time  1005    PT Stop Time  1100    PT Time Calculation (min)  55 min    Activity Tolerance  Patient tolerated treatment well;No increased pain    Behavior During Therapy  WFL for tasks assessed/performed       Past Medical History:  Diagnosis Date  . Arthritis   . Breast cancer ([place]) 2012   left  breast  . Equilibrium disorder   . GERD (gastroesophageal reflux disease)   . Hypertension   . Hypothyroidism   . Personal history of radiation therapy   . Sleep apnea    uses CPAP    Past Surgical History:  Procedure Laterality Date  . ABDOMINAL HYSTERECTOMY    . APPENDECTOMY    . BOTOX INJECTION N/A 09/22/2018   Procedure: Bladder BOTOX INJECTION;  Surgeon: Hollice Espy, MD;  Location: [facility];  Service: Urology;  Laterality: N/A;  General vs MAC for anesthesia  . BREAST BIOPSY Left 2012   Positive  . BREAST EXCISIONAL BIOPSY Right    at age 72's  . BREAST LUMPECTOMY Left 2012   F/U radiation   . BREAST LUMPECTOMY WITH AXILLARY LYMPH NODE BIOPSY Left 2012   with Radiation  . BREAST SURGERY Left 2012   Lumpectomy  . CARDIAC CATHETERIZATION     10 years ago  . CATARACT EXTRACTION W/ INTRAOCULAR LENS  IMPLANT, BILATERAL Bilateral   . COLONOSCOPY    . JOINT REPLACEMENT    . TONSILLECTOMY    . TOTAL KNEE ARTHROPLASTY Left 07/21/2012   Dr Mayer Camel  . TOTAL KNEE ARTHROPLASTY Left 07/21/2012   Procedure: TOTAL KNEE ARTHROPLASTY;  Surgeon:  Kerin Salen, MD;  Location: [place];  Service: Orthopedics;  Laterality: Left;    There were no vitals filed for this visit.  Subjective Assessment - 12/03/18 1013    Subjective  Pt reports things are feeling better but I dont know how it is better. Pt gets up 1-2 x a night to use the bathroom. Pt can usually get to the bathroom without leaking when using the breathing practice. She thinks this is an [employer]. She can do this some of the time. Pt feels she is doing her pelvic rocking 3 x day. If she does not get to the bathroom real quick she is not able to stop her urine. Pt continues to wear her shoe lift.    Pertinent History  2 vaginal deliveries with episiotomies.  Abdominal hysterectomy, appendectomy, L TKA, 3-4 years ago, pt went to [place] and started having equilibrium problems. Pt underwent therapy and still have some equilibrium problems.    Patient Stated Goals  stop leaking         [facility] PT Assessment - 12/04/18 0942      Palpation   Spinal mobility  increased hypomobility at thoracic spine , tightness of medial scap B  Ambulation/Gait   Gait Comments  no deviations in gait, thoracic kyhosis moderate , uses cane                    [facility] Adult PT Treatment/Exercise - 12/04/18 0941      Exercises   Exercises  --   see pt instructions, promotes scap mobility, thoracic ext      Modalities   Modalities  Moist Heat      Moist Heat Therapy   Number Minutes Moist Heat  5 Minutes    Moist Heat Location  --   thoracic      Manual Therapy   Manual therapy comments  STM/MWM Grade II at thoracic spine/ medial scap to promote thoracic extensions                   PT Long Term Goals - 10/20/18 1056      PT LONG TERM GOAL #1   Title  Pt will demo IND with HEP    Time  10    Period  Weeks    Status  On-going    Target Date  12/29/18      PT LONG TERM GOAL #2   Title  Pt will demo deep core coordination and proper technique for level 1 and 2  exercises to promote intraabdominal pressure system for continence    Time  4    Period  Weeks    Status  On-going    Target Date  11/17/18      PT LONG TERM GOAL #3   Title  Pt will increase gait speed from 0.63ms to > 0.8 m/s in order to make it to the bathroom without leakage    Baseline  10-20-18 (2nd visit) 0.727m (good carry over from last visit).    Time  10    Period  Weeks    Status  On-going    Target Date  12/29/18      PT LONG TERM GOAL #4   Title  Pt will demo decreased abdominal scar restrictions in order to optimize TrA co-activation with diahphragm and pelvic floor to gain continence    Baseline  10-20-18- decreased abdominal scar restrictions post manual treatment.    Time  4    Period  Weeks    Status  On-going    Target Date  11/17/18      PT LONG TERM GOAL #5   Title  Pt wil demo less gait deviations ( R trunk lean 2/2 lumbar scoliotic curve) with compliance wearing shoe lift in order to adjust for leg length difference and to walk with longer stride length    Baseline  10-20-18 Pt demonstrated increased upright posture, and reports compliance with heel lift.    Time  2    Period  Weeks    Status  On-going    Target Date  11/03/18      PT LONG TERM GOAL #6   Title  Pt will report decreasing use of pull up pad from 2 x day to < 1 x day in order to improve QOL    Time  10    Period  Weeks    Status  On-going    Target Date  12/29/18            Plan - 12/04/18 0944    Clinical Impression Statement  Pt is makinjg imporvemetns with report of utilizing breathing practices which help her to make it to the  bathroom some of the time before leakage. Today, pt reported she did not use the bathroom before leaving the house and was able to make it to the bathroom after the manual Tx from the clinic room to the bathroom without leakage. Pt felt proud of that. Focused today on minimize thoracic kyphosis with manaul Tx to minimize hypomobility of T spine and optimize  scapular mobility. Lighter manual technique was applied for pain free experience and modified per pt report. Pt demo'd increased ability to self corect with cervical retraction/  thoracic ext post Tx. Anticipate this will continue to accelerate pt's ability to regain continence to minimize pressure onto her bladder/ pelvic floor. Then progress with mroe posterior back strengthening. Pt continues to benefit from skilled PT.    Personal Factors and Comorbidities  Age    Examination-Activity Limitations  Continence;Toileting    Stability/Clinical Decision Making  Evolving/Moderate complexity    Rehab Potential  Good    PT Frequency  1x / week    PT Duration  Other (comment)   10   PT Treatment/Interventions  Gait training;Therapeutic exercise;Balance training;Neuromuscular re-education;Therapeutic activities;Manual lymph drainage;Manual techniques;Scar mobilization;Moist Heat;Spinal Manipulations;Patient/family education;Passive range of motion;Stair training    PT Next Visit Plan  reassess scar mobility,  scapular mobility, QL involvement; open up R side; increased proprioception of pelvic tilts; increase thoracic mobility; **posture taping; step 2 of urge suppression (based on internal or external PFM assessment)-- address hip adductor TPs for improved pelvic floor engagment.    PT Home Exercise Plan  sit<>stand edu; log rolling edu; standing R side stretch; open book to L; scar mobilization with rotation and with pelvic tilts; posture education; diaphragmatic breathing for part 1 of urge suppression; scapular retraction and depression; hook-lying posterior pelvic tilts (cues for coordination)    Consulted and Agree with Plan of Care  Patient       Patient will benefit from skilled therapeutic intervention in order to improve the following deficits and impairments:  Abnormal gait, Hypomobility, Difficulty walking, Increased muscle spasms, Improper body mechanics, Decreased scar mobility, Decreased  coordination, Decreased activity tolerance, Decreased strength, Postural dysfunction, Decreased endurance, Decreased balance  Visit Diagnosis: Other abnormalities of gait and mobility  Other lack of coordination  Muscle weakness (generalized)  Unequal leg length     Problem List Patient Active Problem List   Diagnosis Date Noted  . Ataxia 02/04/2017  . Sinus bradycardia 02/04/2017  . Dizziness 01/21/2017  . History of stroke 01/21/2017  . Constipation 08/04/2012  . Unspecified hypothyroidism 07/31/2012  . Obstructive sleep apnea 07/31/2012  . E. coli UTI (urinary tract infection), fluoroquinolone resistant 07/26/2012  . Essential hypertension, benign 07/26/2012  . Ileus, postoperative ([place]) 07/24/2012  . Hyponatremia 07/24/2012  . Arthritis of knee, left 07/21/2012    Jerl Mina ,PT, DPT, E-RYT  12/04/2018, 9:52 AM  [place] MAIN [facility] SERVICES [address] [place], [place], [address] Phone: [phone]   Fax:  [phone]  Name: Lori Wiggins MRN: [id number] Date of Birth: 1928-11-02

## 2018-12-09 ENCOUNTER — Ambulatory Visit: Payer: PPO | Admitting: Physical Therapy

## 2018-12-09 ENCOUNTER — Other Ambulatory Visit: Payer: Self-pay

## 2018-12-09 DIAGNOSIS — R2689 Other abnormalities of gait and mobility: Secondary | ICD-10-CM | POA: Diagnosis not present

## 2018-12-09 DIAGNOSIS — M6281 Muscle weakness (generalized): Secondary | ICD-10-CM

## 2018-12-09 DIAGNOSIS — M2170 Unequal limb length (acquired), unspecified site: Secondary | ICD-10-CM

## 2018-12-09 DIAGNOSIS — R278 Other lack of coordination: Secondary | ICD-10-CM

## 2018-12-09 NOTE — Therapy (Signed)
[place] MAIN [facility] SERVICES [address] [place], [place], [address] Phone: [phone]   Fax:  [phone]  Physical Therapy Treatment  Patient Details  Name: Lori Wiggins MRN: [id number] Date of Birth: 09/18/1928 Referring Provider (PT): Vaillancourt   Encounter Date: 12/09/2018  PT End of Session - 12/09/18 1059    Visit Number  8    Number of Visits  10    Date for PT Re-Evaluation  12/23/18    Authorization - Visit Number  7    Authorization - Number of Visits  10    PT Start Time  1009    PT Stop Time  5277    PT Time Calculation (min)  54 min    Activity Tolerance  Patient tolerated treatment well;No increased pain    Behavior During Therapy  WFL for tasks assessed/performed       Past Medical History:  Diagnosis Date  . Arthritis   . Breast cancer ([place]) 2012   left  breast  . Equilibrium disorder   . GERD (gastroesophageal reflux disease)   . Hypertension   . Hypothyroidism   . Personal history of radiation therapy   . Sleep apnea    uses CPAP    Past Surgical History:  Procedure Laterality Date  . ABDOMINAL HYSTERECTOMY    . APPENDECTOMY    . BOTOX INJECTION N/A 09/22/2018   Procedure: Bladder BOTOX INJECTION;  Surgeon: Hollice Espy, MD;  Location: [facility];  Service: Urology;  Laterality: N/A;  General vs MAC for anesthesia  . BREAST BIOPSY Left 2012   Positive  . BREAST EXCISIONAL BIOPSY Right    at age 76's  . BREAST LUMPECTOMY Left 2012   F/U radiation   . BREAST LUMPECTOMY WITH AXILLARY LYMPH NODE BIOPSY Left 2012   with Radiation  . BREAST SURGERY Left 2012   Lumpectomy  . CARDIAC CATHETERIZATION     10 years ago  . CATARACT EXTRACTION W/ INTRAOCULAR LENS  IMPLANT, BILATERAL Bilateral   . COLONOSCOPY    . JOINT REPLACEMENT    . TONSILLECTOMY    . TOTAL KNEE ARTHROPLASTY Left 07/21/2012   Dr Mayer Camel  . TOTAL KNEE ARTHROPLASTY Left 07/21/2012   Procedure: TOTAL KNEE ARTHROPLASTY;  Surgeon:  Kerin Salen, MD;  Location: [place];  Service: Orthopedics;  Laterality: Left;    There were no vitals filed for this visit.  Subjective Assessment - 12/09/18 1017    Subjective  Pt reports she slept through the night without having to use the bathroom last night and the night before. Pt felt a little whoozy after last session but it was brief.  Pt also reports less frequency    Pertinent History  2 vaginal deliveries with episiotomies.  Abdominal hysterectomy, appendectomy, L TKA, 3-4 years ago, pt went to [place] and started having equilibrium problems. Pt underwent therapy and still have some equilibrium problems.    Patient Stated Goals  stop leaking         [facility] PT Assessment - 12/09/18 1058      Observation/Other Assessments   Observations  more upright posture       PROM   Overall PROM Comments  hip ext limited       Palpation   Spinal mobility  increased hypomobility at thoracic spine , tightness of posterior scap B      Palpation comment  fascial mobility restricted in L upper quadrant, lower R quadrant, QL tightness on R (  post Tx: improved)         Ambulation/Gait   Gait Comments  ncreased hip flexion with less cues,                    [facility] Adult PT Treatment/Exercise - 12/09/18 1057      Neuro Re-ed    Neuro Re-ed Details   reviewed technique for HEP ( minor cues)       Moist Heat Therapy   Number Minutes Moist Heat  10 Minutes    Moist Heat Location  --   neck/ thoracic      Manual Therapy   Manual therapy comments  STM/MWM Grade II at thoracic spine/ medial scap to promote thoracic extensions     Soft tissue mobilization  abdominal fascial releases                    PT Long Term Goals - 10/20/18 1056      PT LONG TERM GOAL #1   Title  Pt will demo IND with HEP    Time  10    Period  Weeks    Status  On-going    Target Date  12/29/18      PT LONG TERM GOAL #2   Title  Pt will demo deep core coordination and proper technique for  level 1 and 2 exercises to promote intraabdominal pressure system for continence    Time  4    Period  Weeks    Status  On-going    Target Date  11/17/18      PT LONG TERM GOAL #3   Title  Pt will increase gait speed from 0.6ms to > 0.8 m/s in order to make it to the bathroom without leakage    Baseline  10-20-18 (2nd visit) 0.729m (good carry over from last visit).    Time  10    Period  Weeks    Status  On-going    Target Date  12/29/18      PT LONG TERM GOAL #4   Title  Pt will demo decreased abdominal scar restrictions in order to optimize TrA co-activation with diahphragm and pelvic floor to gain continence    Baseline  10-20-18- decreased abdominal scar restrictions post manual treatment.    Time  4    Period  Weeks    Status  On-going    Target Date  11/17/18      PT LONG TERM GOAL #5   Title  Pt wil demo less gait deviations ( R trunk lean 2/2 lumbar scoliotic curve) with compliance wearing shoe lift in order to adjust for leg length difference and to walk with longer stride length    Baseline  10-20-18 Pt demonstrated increased upright posture, and reports compliance with heel lift.    Time  2    Period  Weeks    Status  On-going    Target Date  11/03/18      PT LONG TERM GOAL #6   Title  Pt will report decreasing use of pull up pad from 2 x day to < 1 x day in order to improve QOL    Time  10    Period  Weeks    Status  On-going    Target Date  12/29/18            Plan - 12/09/18 1018    Clinical Impression Statement  Pt is improving well with more deepc ore  strength as pt reports she slept through the night without having to use the bathroom last night and the night before.  Pt also reports less frequency. Continued with gentle manual Tx to improve thoracic kyphosis/ forward head posture and decreasing abdominal fascial restrictions and increase hip extension PROM. Anticiapte these improvements will facilitate longer stride length and improve intraabdominal  pressusre system for more continence and getting to the bathroom at greater speed. Pt toelrated Tx without complaints. Pt continues to benefit from skilled PT.    Personal Factors and Comorbidities  Age    Examination-Activity Limitations  Continence;Toileting    Stability/Clinical Decision Making  Evolving/Moderate complexity    Rehab Potential  Good    PT Frequency  1x / week    PT Duration  Other (comment)   10   PT Treatment/Interventions  Gait training;Therapeutic exercise;Balance training;Neuromuscular re-education;Therapeutic activities;Manual lymph drainage;Manual techniques;Scar mobilization;Moist Heat;Spinal Manipulations;Patient/family education;Passive range of motion;Stair training    PT Next Visit Plan  reassess scar mobility,  scapular mobility, QL involvement; open up R side; increased proprioception of pelvic tilts; increase thoracic mobility; **posture taping; step 2 of urge suppression (based on internal or external PFM assessment)-- address hip adductor TPs for improved pelvic floor engagment.    PT Home Exercise Plan  sit<>stand edu; log rolling edu; standing R side stretch; open book to L; scar mobilization with rotation and with pelvic tilts; posture education; diaphragmatic breathing for part 1 of urge suppression; scapular retraction and depression; hook-lying posterior pelvic tilts (cues for coordination)    Consulted and Agree with Plan of Care  Patient       Patient will benefit from skilled therapeutic intervention in order to improve the following deficits and impairments:  Abnormal gait, Hypomobility, Difficulty walking, Increased muscle spasms, Improper body mechanics, Decreased scar mobility, Decreased coordination, Decreased activity tolerance, Decreased strength, Postural dysfunction, Decreased endurance, Decreased balance  Visit Diagnosis: Other abnormalities of gait and mobility  Other lack of coordination  Muscle weakness (generalized)  Unequal leg  length     Problem List Patient Active Problem List   Diagnosis Date Noted  . Ataxia 02/04/2017  . Sinus bradycardia 02/04/2017  . Dizziness 01/21/2017  . History of stroke 01/21/2017  . Constipation 08/04/2012  . Unspecified hypothyroidism 07/31/2012  . Obstructive sleep apnea 07/31/2012  . E. coli UTI (urinary tract infection), fluoroquinolone resistant 07/26/2012  . Essential hypertension, benign 07/26/2012  . Ileus, postoperative ([place]) 07/24/2012  . Hyponatremia 07/24/2012  . Arthritis of knee, left 07/21/2012    Jerl Mina ,PT, DPT, E-RYT  12/09/2018, 11:01 AM  [place] MAIN [facility] SERVICES [address] [place], [place], [address] Phone: [phone]   Fax:  [phone]  Name: Lori Wiggins MRN: [id number] Date of Birth: 07-Apr-1928

## 2018-12-18 ENCOUNTER — Other Ambulatory Visit: Payer: Self-pay

## 2018-12-18 ENCOUNTER — Ambulatory Visit: Payer: PPO | Admitting: Physical Therapy

## 2018-12-18 DIAGNOSIS — R2689 Other abnormalities of gait and mobility: Secondary | ICD-10-CM

## 2018-12-18 DIAGNOSIS — M2170 Unequal limb length (acquired), unspecified site: Secondary | ICD-10-CM

## 2018-12-18 DIAGNOSIS — R278 Other lack of coordination: Secondary | ICD-10-CM

## 2018-12-18 DIAGNOSIS — M6281 Muscle weakness (generalized): Secondary | ICD-10-CM

## 2018-12-18 NOTE — Therapy (Signed)
[place] MAIN [facility] SERVICES [address] [place], [place], [address] Phone: [phone]   Fax:  [phone]  Physical Therapy Treatment  Patient Details  Name: Lori Wiggins MRN: [id number] Date of Birth: Jan 18, 1928 Referring Provider (PT): Vaillancourt   Encounter Date: 12/18/2018  PT End of Session - 12/18/18 1746    Visit Number  9    Number of Visits  10    Date for PT Re-Evaluation  12/23/18    Authorization - Visit Number  9    Authorization - Number of Visits  10    PT Start Time  6301    PT Stop Time  1500    PT Time Calculation (min)  55 min    Activity Tolerance  Patient tolerated treatment well;No increased pain    Behavior During Therapy  WFL for tasks assessed/performed       Past Medical History:  Diagnosis Date  . Arthritis   . Breast cancer ([place]) 2012   left  breast  . Equilibrium disorder   . GERD (gastroesophageal reflux disease)   . Hypertension   . Hypothyroidism   . Personal history of radiation therapy   . Sleep apnea    uses CPAP    Past Surgical History:  Procedure Laterality Date  . ABDOMINAL HYSTERECTOMY    . APPENDECTOMY    . BOTOX INJECTION N/A 09/22/2018   Procedure: Bladder BOTOX INJECTION;  Surgeon: Hollice Espy, MD;  Location: [facility];  Service: Urology;  Laterality: N/A;  General vs MAC for anesthesia  . BREAST BIOPSY Left 2012   Positive  . BREAST EXCISIONAL BIOPSY Right    at age 24's  . BREAST LUMPECTOMY Left 2012   F/U radiation   . BREAST LUMPECTOMY WITH AXILLARY LYMPH NODE BIOPSY Left 2012   with Radiation  . BREAST SURGERY Left 2012   Lumpectomy  . CARDIAC CATHETERIZATION     10 years ago  . CATARACT EXTRACTION W/ INTRAOCULAR LENS  IMPLANT, BILATERAL Bilateral   . COLONOSCOPY    . JOINT REPLACEMENT    . TONSILLECTOMY    . TOTAL KNEE ARTHROPLASTY Left 07/21/2012   Dr Mayer Camel  . TOTAL KNEE ARTHROPLASTY Left 07/21/2012   Procedure: TOTAL KNEE ARTHROPLASTY;  Surgeon:  Kerin Salen, MD;  Location: [place];  Service: Orthopedics;  Laterality: Left;    There were no vitals filed for this visit.  Subjective Assessment - 12/18/18 1413    Subjective  Pt reports the pelvic rock exercises has helped her urge.    Pertinent History  2 vaginal deliveries with episiotomies.  Abdominal hysterectomy, appendectomy, L TKA, 3-4 years ago, pt went to [place] and started having equilibrium problems. Pt underwent therapy and still have some equilibrium problems.    Patient Stated Goals  stop leaking         [facility] PT Assessment - 12/18/18 1750      Coordination   Gross Motor Movements are Fluid and Coordinated  --   dyscoordination of pelvic floor, ab straining     Palpation   Palpation comment  restricted scars over abdomen ( decreased post Tx)                    [facility] Adult PT Treatment/Exercise - 12/18/18 1749      Neuro Re-ed    Neuro Re-ed Details   excessive cues for deep core level 2 and pelvic tilts       Manual Therapy  Manual therapy comments  fascial releases over abdominal scar                   PT Long Term Goals - 10/20/18 1056      PT LONG TERM GOAL #1   Title  Pt will demo IND with HEP    Time  10    Period  Weeks    Status  On-going    Target Date  12/29/18      PT LONG TERM GOAL #2   Title  Pt will demo deep core coordination and proper technique for level 1 and 2 exercises to promote intraabdominal pressure system for continence    Time  4    Period  Weeks    Status  On-going    Target Date  11/17/18      PT LONG TERM GOAL #3   Title  Pt will increase gait speed from 0.41ms to > 0.8 m/s in order to make it to the bathroom without leakage    Baseline  10-20-18 (2nd visit) 0.772m (good carry over from last visit).    Time  10    Period  Weeks    Status  On-going    Target Date  12/29/18      PT LONG TERM GOAL #4   Title  Pt will demo decreased abdominal scar restrictions in order to optimize TrA  co-activation with diahphragm and pelvic floor to gain continence    Baseline  10-20-18- decreased abdominal scar restrictions post manual treatment.    Time  4    Period  Weeks    Status  On-going    Target Date  11/17/18      PT LONG TERM GOAL #5   Title  Pt wil demo less gait deviations ( R trunk lean 2/2 lumbar scoliotic curve) with compliance wearing shoe lift in order to adjust for leg length difference and to walk with longer stride length    Baseline  10-20-18 Pt demonstrated increased upright posture, and reports compliance with heel lift.    Time  2    Period  Weeks    Status  On-going    Target Date  11/03/18      PT LONG TERM GOAL #6   Title  Pt will report decreasing use of pull up pad from 2 x day to < 1 x day in order to improve QOL    Time  10    Period  Weeks    Status  On-going    Target Date  12/29/18            Plan - 12/18/18 1746    Clinical Impression Statement  Pt demo'd decreased abdominal scar restrictions which improved co-activation of trA , diaphragm, and pelvic floor. However, pt required excessive tactile and verbal cues to minimize dyscoordination and overuse of ab mm.  Diaphragmatic excursion is in place with improved upright neck and shoulder posture from past visits' Tx. Today, pt demo'd proper coordination post Tx but deep core level 2 was not added to HEP due to pt needing simplified HEP and pt to benefit from proper pelvic tilt technique practice. Pt continues to express improved ability to make it to the bathroom before leakage. Anticipate pt will progress towards goals with skilled PT.    Personal Factors and Comorbidities  Age    Examination-Activity Limitations  Continence;Toileting    Stability/Clinical Decision Making  Evolving/Moderate complexity    Rehab Potential  Good  PT Frequency  1x / week    PT Duration  Other (comment)   10   PT Treatment/Interventions  Gait training;Therapeutic exercise;Balance training;Neuromuscular  re-education;Therapeutic activities;Manual lymph drainage;Manual techniques;Scar mobilization;Moist Heat;Spinal Manipulations;Patient/family education;Passive range of motion;Stair training    PT Next Visit Plan  reassess scar mobility,  scapular mobility, QL involvement; open up R side; increased proprioception of pelvic tilts; increase thoracic mobility; **posture taping; step 2 of urge suppression (based on internal or external PFM assessment)-- address hip adductor TPs for improved pelvic floor engagment.    PT Home Exercise Plan  sit<>stand edu; log rolling edu; standing R side stretch; open book to L; scar mobilization with rotation and with pelvic tilts; posture education; diaphragmatic breathing for part 1 of urge suppression; scapular retraction and depression; hook-lying posterior pelvic tilts (cues for coordination)    Consulted and Agree with Plan of Care  Patient       Patient will benefit from skilled therapeutic intervention in order to improve the following deficits and impairments:  Abnormal gait, Hypomobility, Difficulty walking, Increased muscle spasms, Improper body mechanics, Decreased scar mobility, Decreased coordination, Decreased activity tolerance, Decreased strength, Postural dysfunction, Decreased endurance, Decreased balance  Visit Diagnosis: Other abnormalities of gait and mobility  Other lack of coordination  Muscle weakness (generalized)  Unequal leg length     Problem List Patient Active Problem List   Diagnosis Date Noted  . Ataxia 02/04/2017  . Sinus bradycardia 02/04/2017  . Dizziness 01/21/2017  . History of stroke 01/21/2017  . Constipation 08/04/2012  . Unspecified hypothyroidism 07/31/2012  . Obstructive sleep apnea 07/31/2012  . E. coli UTI (urinary tract infection), fluoroquinolone resistant 07/26/2012  . Essential hypertension, benign 07/26/2012  . Ileus, postoperative ([place]) 07/24/2012  . Hyponatremia 07/24/2012  . Arthritis of knee, left  07/21/2012    Jerl Mina ,PT, DPT, E-RYT  12/18/2018, 5:51 PM  [place] MAIN [facility] SERVICES [address] [place], [place], [address] Phone: [phone]   Fax:  [phone]  Name: Lori Wiggins MRN: [id number] Date of Birth: 03/01/1928

## 2018-12-18 NOTE — Patient Instructions (Signed)
Refine pelvic tilts   Push feet into bed,  Inhale, pelvic rocks forward, do not push stomach outward, soft sounding of breath Exhale, pelvic rocks upward like zipping a zipper  6 min

## 2018-12-23 ENCOUNTER — Other Ambulatory Visit: Payer: Self-pay

## 2018-12-23 ENCOUNTER — Ambulatory Visit: Payer: PPO | Admitting: Physical Therapy

## 2018-12-23 DIAGNOSIS — R2689 Other abnormalities of gait and mobility: Secondary | ICD-10-CM | POA: Diagnosis not present

## 2018-12-23 DIAGNOSIS — M6281 Muscle weakness (generalized): Secondary | ICD-10-CM

## 2018-12-23 DIAGNOSIS — M2170 Unequal limb length (acquired), unspecified site: Secondary | ICD-10-CM

## 2018-12-23 DIAGNOSIS — R278 Other lack of coordination: Secondary | ICD-10-CM

## 2018-12-23 NOTE — Patient Instructions (Signed)
  Exercises to do this week  Open book   __  Pelvic tilt exercise  With softer inhale, not pushing stomach, inhale soft and expand ribs  Exhale, rock pelvic back, then a gentle squeeze of pelvic floor muscles   6 min   X 2 x day ______    Standing at kitchen counter to improve balance, strengthen hips, and create upright posture  3 min To music of your choice!   L arm up, R foot back Switch

## 2018-12-24 NOTE — Therapy (Signed)
[place] MAIN [facility] SERVICES [address] [place], [place], [address] Phone: [phone]   Fax:  [phone]  Physical Therapy Treatment/Progress Note reproting 10/14/18 -12/23/18 ( 10 th visit)  Patient Details  Name: Lori Wiggins MRN: [id number] Date of Birth: 09-Sep-1928 Referring Provider (PT): Vaillancourt   Encounter Date: 12/23/2018  PT End of Session - 12/24/18 1660    Visit Number  10    Number of Visits  20    Date for PT Re-Evaluation  03/04/19   PN 12/23/18   Authorization - Visit Number  10    Authorization - Number of Visits  20    PT Start Time  1500    PT Stop Time  6301    PT Time Calculation (min)  55 min    Activity Tolerance  Patient tolerated treatment well;No increased pain    Behavior During Therapy  WFL for tasks assessed/performed       Past Medical History:  Diagnosis Date  . Arthritis   . Breast cancer ([place]) 2012   left  breast  . Equilibrium disorder   . GERD (gastroesophageal reflux disease)   . Hypertension   . Hypothyroidism   . Personal history of radiation therapy   . Sleep apnea    uses CPAP    Past Surgical History:  Procedure Laterality Date  . ABDOMINAL HYSTERECTOMY    . APPENDECTOMY    . BOTOX INJECTION N/A 09/22/2018   Procedure: Bladder BOTOX INJECTION;  Surgeon: Hollice Espy, MD;  Location: [facility] ORS;  Service: Urology;  Laterality: N/A;  General vs MAC for anesthesia  . BREAST BIOPSY Left 2012   Positive  . BREAST EXCISIONAL BIOPSY Right    at age 60's  . BREAST LUMPECTOMY Left 2012   F/U radiation   . BREAST LUMPECTOMY WITH AXILLARY LYMPH NODE BIOPSY Left 2012   with Radiation  . BREAST SURGERY Left 2012   Lumpectomy  . CARDIAC CATHETERIZATION     10 years ago  . CATARACT EXTRACTION W/ INTRAOCULAR LENS  IMPLANT, BILATERAL Bilateral   . COLONOSCOPY    . JOINT REPLACEMENT    . TONSILLECTOMY    . TOTAL KNEE ARTHROPLASTY Left 07/21/2012   Dr Mayer Camel  . TOTAL KNEE  ARTHROPLASTY Left 07/21/2012   Procedure: TOTAL KNEE ARTHROPLASTY;  Surgeon: Kerin Salen, MD;  Location: [place];  Service: Orthopedics;  Laterality: Left;    There were no vitals filed for this visit.  Subjective Assessment - 12/23/18 1511    Subjective  Pt feels like the new way to do pelvic tilt was not as helpful for urge.    Pertinent History  2 vaginal deliveries with episiotomies.  Abdominal hysterectomy, appendectomy, L TKA, 3-4 years ago, pt went to [place] and started having equilibrium problems. Pt underwent therapy and still have some equilibrium problems.    Patient Stated Goals  stop leaking         [facility] PT Assessment - 12/24/18 1127      Coordination   Gross Motor Movements are Fluid and Coordinated  --   improved deep core coordination, less cues required    Fine Motor Movements are Fluid and Coordinated  --   less abdominal straining w/ pelvic tilt               Pelvic Floor Special Questions - 12/24/18 1547    External Perineal Exam  improved coordination, demo'd quick contraction with co-activation of abdomen and pelvic floor  [place] Adult PT Treatment/Exercise - 12/24/18 1545      Therapeutic Activites    Therapeutic Activities  --   functional exercise added for balance/ thoracic ext/ glut st     Neuro Re-ed    Neuro Re-ed Details   cued deep core, pelvic tilt, quick pelvic contraction                  PT Long Term Goals - 12/24/18 1549      PT LONG TERM GOAL #1   Title  Pt will demo IND with HEP    Time  10    Period  Weeks    Status  Partially Met      PT LONG TERM GOAL #2   Title  Pt will demo deep core coordination and proper technique for level 1 and 2 exercises to promote intraabdominal pressure system for continence    Time  4    Period  Weeks    Status  Achieved      PT LONG TERM GOAL #3   Title  Pt will increase gait speed from 0.65ms to > 0.8 m/s in order to make it to the bathroom without leakage    Baseline   10-20-18 (2nd visit) 0.780m (good carry over from last visit).     Time  10    Period  Weeks    Status  On-going      PT LONG TERM GOAL #4   Title  Pt will demo decreased abdominal scar restrictions in order to optimize TrA co-activation with diahphragm and pelvic floor to gain continence    Baseline  10-20-18- decreased abdominal scar restrictions post manual treatment.  12-23-18 smoother scars    Time  4    Period  Weeks    Status  Achieved      PT LONG TERM GOAL #5   Title  Pt wil demo less gait deviations ( R trunk lean 2/2 lumbar scoliotic curve) with compliance wearing shoe lift in order to adjust for leg length difference and to walk with longer stride length    Baseline  10-20-18 Pt demonstrated increased upright posture, and reports compliance with heel lift.    Time  2    Period  Weeks    Status  Achieved      PT LONG TERM GOAL #6   Title  Pt will report decreasing use of pull up pad from 2 x day to < 1 x day in order to improve QOL    Time  10    Period  Weeks    Status  On-going            Plan - 12/24/18 1548    Clinical Impression Statement  Pt has achieved 3 out of 6 goals and progressing well towards remaining goals. Pt reports less urgency and being able to make it to the toilet before leakage more often. Pt has demonstrated equal pelvic girdle / leg length with shoe lift and manual Tx. Pt's scoliosis and thoracic kyphosis posture have been addressed which has helped to increase her her gait speed to get to the bathroom efficiently.  Pt also benefited from manual Tx/ neuromuscular re-education to minimize abdominal scars, increase pelvic/ spinal mobility to achieve more mobility of pelvic floor, abdominal mm, and diaphragm ( deep core muscles) . Today, pt showed a milestone achievement with proper pelvic floor movement with co-activation of pelvic floor and other deep core muscles which corrected her dyscoordination pattern (  straining with abdominal muscles to  access pelvic floor).   Added functional exercise today in standing position to promote thoracic extension, balance, and strengtening of posterior back/ glut muscles.  Anticipate pt will continue to benefit from further skilled PT and achieve remaining goals.         Personal Factors and Comorbidities  Age    Examination-Activity Limitations  Continence;Toileting    Stability/Clinical Decision Making  Evolving/Moderate complexity    Rehab Potential  Good    PT Frequency  1x / week    PT Duration  Other (comment)   10   PT Treatment/Interventions  Gait training;Therapeutic exercise;Balance training;Neuromuscular re-education;Therapeutic activities;Manual lymph drainage;Manual techniques;Scar mobilization;Moist Heat;Spinal Manipulations;Patient/family education;Passive range of motion;Stair training    PT Next Visit Plan  reassess scar mobility,  scapular mobility, QL involvement; open up R side; increased proprioception of pelvic tilts; increase thoracic mobility; **posture taping; step 2 of urge suppression (based on internal or external PFM assessment)-- address hip adductor TPs for improved pelvic floor engagment.    PT Home Exercise Plan  sit<>stand edu; log rolling edu; standing R side stretch; open book to L; scar mobilization with rotation and with pelvic tilts; posture education; diaphragmatic breathing for part 1 of urge suppression; scapular retraction and depression; hook-lying posterior pelvic tilts (cues for coordination)    Consulted and Agree with Plan of Care  Patient       Patient will benefit from skilled therapeutic intervention in order to improve the following deficits and impairments:  Abnormal gait, Hypomobility, Difficulty walking, Increased muscle spasms, Improper body mechanics, Decreased scar mobility, Decreased coordination, Decreased activity tolerance, Decreased strength, Postural dysfunction, Decreased endurance, Decreased balance  Visit Diagnosis: Other  abnormalities of gait and mobility  Other lack of coordination  Muscle weakness (generalized)  Unequal leg length     Problem List Patient Active Problem List   Diagnosis Date Noted  . Ataxia 02/04/2017  . Sinus bradycardia 02/04/2017  . Dizziness 01/21/2017  . History of stroke 01/21/2017  . Constipation 08/04/2012  . Unspecified hypothyroidism 07/31/2012  . Obstructive sleep apnea 07/31/2012  . E. coli UTI (urinary tract infection), fluoroquinolone resistant 07/26/2012  . Essential hypertension, benign 07/26/2012  . Ileus, postoperative ([place]) 07/24/2012  . Hyponatremia 07/24/2012  . Arthritis of knee, left 07/21/2012    Jerl Mina ,PT, DPT, E-RYT  12/24/2018, 3:50 PM  [place] MAIN [facility] SERVICES [address] [place], [place], [address] Phone: [phone]   Fax:  [phone]  Name: Lori Wiggins MRN: [id number] Date of Birth: 1928-09-09

## 2018-12-30 ENCOUNTER — Ambulatory Visit: Payer: PPO | Admitting: Physical Therapy

## 2018-12-30 ENCOUNTER — Other Ambulatory Visit: Payer: Self-pay

## 2018-12-30 DIAGNOSIS — M6281 Muscle weakness (generalized): Secondary | ICD-10-CM

## 2018-12-30 DIAGNOSIS — R278 Other lack of coordination: Secondary | ICD-10-CM

## 2018-12-30 DIAGNOSIS — R2689 Other abnormalities of gait and mobility: Secondary | ICD-10-CM | POA: Diagnosis not present

## 2018-12-30 DIAGNOSIS — M2170 Unequal limb length (acquired), unspecified site: Secondary | ICD-10-CM

## 2018-12-30 NOTE — Patient Instructions (Addendum)
Keep up the good work!     ___  Quick pelvic floor squeezes   Gentle expansion at the ribs on inhale Exhale gentle lift of pelvic floor, no thigh squeezing Adding in seated quick pelvic floor contractions   At breakfast, lunch , dinner  Seated in chair  10 reps Keep count with fingers

## 2018-12-30 NOTE — Therapy (Signed)
[place] MAIN [facility] SERVICES [address] [place], [place], [address] Phone: [phone]   Fax:  [phone]  Physical Therapy Treatment  Patient Details  Name: Lori Wiggins MRN: [id number] Date of Birth: 19-Aug-1928 Referring Provider (PT): Vaillancourt   Encounter Date: 12/30/2018  PT End of Session - 12/30/18 1104    Visit Number  11    Number of Visits  20    Date for PT Re-Evaluation  03/04/19   PN 12/23/18   Authorization - Visit Number  11    Authorization - Number of Visits  20    PT Start Time  1100    PT Stop Time  7209    PT Time Calculation (min)  58 min    Activity Tolerance  Patient tolerated treatment well;No increased pain    Behavior During Therapy  WFL for tasks assessed/performed       Past Medical History:  Diagnosis Date  . Arthritis   . Breast cancer ([place]) 2012   left  breast  . Equilibrium disorder   . GERD (gastroesophageal reflux disease)   . Hypertension   . Hypothyroidism   . Personal history of radiation therapy   . Sleep apnea    uses CPAP    Past Surgical History:  Procedure Laterality Date  . ABDOMINAL HYSTERECTOMY    . APPENDECTOMY    . BOTOX INJECTION N/A 09/22/2018   Procedure: Bladder BOTOX INJECTION;  Surgeon: Hollice Espy, MD;  Location: [facility] ORS;  Service: Urology;  Laterality: N/A;  General vs MAC for anesthesia  . BREAST BIOPSY Left 2012   Positive  . BREAST EXCISIONAL BIOPSY Right    at age 60's  . BREAST LUMPECTOMY Left 2012   F/U radiation   . BREAST LUMPECTOMY WITH AXILLARY LYMPH NODE BIOPSY Left 2012   with Radiation  . BREAST SURGERY Left 2012   Lumpectomy  . CARDIAC CATHETERIZATION     10 years ago  . CATARACT EXTRACTION W/ INTRAOCULAR LENS  IMPLANT, BILATERAL Bilateral   . COLONOSCOPY    . JOINT REPLACEMENT    . TONSILLECTOMY    . TOTAL KNEE ARTHROPLASTY Left 07/21/2012   Dr Mayer Camel  . TOTAL KNEE ARTHROPLASTY Left 07/21/2012   Procedure: TOTAL KNEE  ARTHROPLASTY;  Surgeon: Kerin Salen, MD;  Location: [place];  Service: Orthopedics;  Laterality: Left;    There were no vitals filed for this visit.  Subjective Assessment - 12/30/18 1104    Subjective  Pt reports she is no longer housebound and does not have to go to the toilet as often as before. Pt is making it to the toilet with a small squirt of urine before getting to the bathroom.  This is much better because she was not able to stop leakage at all.    Pertinent History  2 vaginal deliveries with episiotomies.  Abdominal hysterectomy, appendectomy, L TKA, 3-4 years ago, pt went to [place] and started having equilibrium problems. Pt underwent therapy and still have some equilibrium problems.    Patient Stated Goals  stop leaking         [facility] PT Assessment - 12/30/18 1455      Ambulation/Gait   Gait Comments  1.02 m/s longer stride, increased hipflex/ knee flexion, swing phase, less trunk lean                  Pelvic Floor Special Questions - 12/30/18 1159    External Palpation  increased tightness L >  R  bulbospongious/ ischiocavernous and suprapubic area    pt declined removal of Depends diaper       [facility] Adult PT Treatment/Exercise - 12/30/18 1158      Manual Therapy   Soft tissue mobilization  STM at B  ischiocavernosus,  bulbospongious B and suprapubic area    externally                  PT Long Term Goals - 12/30/18 1102      PT LONG TERM GOAL #1   Title  Pt will demo IND with HEP    Time  10    Period  Weeks    Status  Achieved      PT LONG TERM GOAL #2   Title  Pt will demo deep core coordination and proper technique for level 1 and 2 exercises to promote intraabdominal pressure system for continence    Time  4    Period  Weeks    Status  Achieved      PT LONG TERM GOAL #3   Title  Pt will increase gait speed from 0.9ms to > 0.8 m/s in order to make it to the bathroom without leakage    Baseline  10-20-18 (2nd visit) 0.738m (good carry  over from last visit).  12-30-18 ( 1.02 m/s)    Time  10    Period  Weeks    Status  Achieved      PT LONG TERM GOAL #4   Title  Pt will demo decreased abdominal scar restrictions in order to optimize TrA co-activation with diahphragm and pelvic floor to gain continence    Baseline  10-20-18- decreased abdominal scar restrictions post manual treatment.    Time  4    Period  Weeks    Status  Achieved      PT LONG TERM GOAL #5   Title  Pt wil demo less gait deviations ( R trunk lean 2/2 lumbar scoliotic curve) with compliance wearing shoe lift in order to adjust for leg length difference and to walk with longer stride length    Baseline  10-20-18 Pt demonstrated increased upright posture, and reports compliance with heel lift.    Time  2    Period  Weeks    Status  Achieved      PT LONG TERM GOAL #6   Title  Pt will report decreasing use of pull up pad from 2 x day to < 1 x day in order to improve QOL    Time  10    Period  Weeks    Status  Achieved            Plan - 12/30/18 1103    Clinical Impression Statement  Pt 's gait speed increased from 0.55m34mto 1.0 m/s with increased trunk rotation, longer stride. Pt reports better continence and less urge and notices less ocassions of a lowered bladder.  Progressed pt to seated quick pelvic contractions because pt is demo'ing more proper coordination of pelvic floor co-activation with abdominal and diaphragm. Pt required more cues for less overuse with adductor/ glut mm with contractions. Pt demo'd increased anterior pelvic floor mm tightness which decreased postTx. Anticipate pt's new ability to perform proper coordination without straining pelvic floor along with today's external manual Tx will together help with decreasing lower position of bladder.   Pt continues to benefit from skilled PT.       Personal Factors and Comorbidities  Age  Examination-Activity Limitations  Continence;Toileting    Stability/Clinical Decision Making   Evolving/Moderate complexity    Rehab Potential  Good    PT Frequency  1x / week    PT Duration  Other (comment)   10   PT Treatment/Interventions  Gait training;Therapeutic exercise;Balance training;Neuromuscular re-education;Therapeutic activities;Manual lymph drainage;Manual techniques;Scar mobilization;Moist Heat;Spinal Manipulations;Patient/family education;Passive range of motion;Stair training    PT Next Visit Plan  reassess scar mobility,  scapular mobility, QL involvement; open up R side; increased proprioception of pelvic tilts; increase thoracic mobility; **posture taping; step 2 of urge suppression (based on internal or external PFM assessment)-- address hip adductor TPs for improved pelvic floor engagment.    PT Home Exercise Plan  sit<>stand edu; log rolling edu; standing R side stretch; open book to L; scar mobilization with rotation and with pelvic tilts; posture education; diaphragmatic breathing for part 1 of urge suppression; scapular retraction and depression; hook-lying posterior pelvic tilts (cues for coordination)    Consulted and Agree with Plan of Care  Patient       Patient will benefit from skilled therapeutic intervention in order to improve the following deficits and impairments:  Abnormal gait, Hypomobility, Difficulty walking, Increased muscle spasms, Improper body mechanics, Decreased scar mobility, Decreased coordination, Decreased activity tolerance, Decreased strength, Postural dysfunction, Decreased endurance, Decreased balance  Visit Diagnosis: Other abnormalities of gait and mobility  Other lack of coordination  Muscle weakness (generalized)  Unequal leg length     Problem List Patient Active Problem List   Diagnosis Date Noted  . Ataxia 02/04/2017  . Sinus bradycardia 02/04/2017  . Dizziness 01/21/2017  . History of stroke 01/21/2017  . Constipation 08/04/2012  . Unspecified hypothyroidism 07/31/2012  . Obstructive sleep apnea 07/31/2012  .  E. coli UTI (urinary tract infection), fluoroquinolone resistant 07/26/2012  . Essential hypertension, benign 07/26/2012  . Ileus, postoperative ([place]) 07/24/2012  . Hyponatremia 07/24/2012  . Arthritis of knee, left 07/21/2012    Jerl Mina 12/30/2018, 3:08 PM  [place] MAIN [facility] SERVICES [address] [place], [place], [address] Phone: [phone]   Fax:  [phone]  Name: Lori Wiggins MRN: [id number] Date of Birth: 1928-12-12

## 2019-01-06 ENCOUNTER — Encounter: Payer: PPO | Admitting: Physical Therapy

## 2019-01-09 DIAGNOSIS — G4733 Obstructive sleep apnea (adult) (pediatric): Secondary | ICD-10-CM | POA: Diagnosis not present

## 2019-01-13 ENCOUNTER — Ambulatory Visit: Payer: PPO | Attending: Physician Assistant | Admitting: Physical Therapy

## 2019-01-13 ENCOUNTER — Other Ambulatory Visit: Payer: Self-pay

## 2019-01-13 DIAGNOSIS — R2689 Other abnormalities of gait and mobility: Secondary | ICD-10-CM | POA: Diagnosis not present

## 2019-01-13 DIAGNOSIS — M6281 Muscle weakness (generalized): Secondary | ICD-10-CM | POA: Insufficient documentation

## 2019-01-13 DIAGNOSIS — R278 Other lack of coordination: Secondary | ICD-10-CM | POA: Diagnosis not present

## 2019-01-13 DIAGNOSIS — M2170 Unequal limb length (acquired), unspecified site: Secondary | ICD-10-CM | POA: Diagnosis not present

## 2019-01-13 NOTE — Patient Instructions (Addendum)
Seated with yellow band Inhale,  exhale and pull apart with elbows staying at the ribs   20 reps x  2  x day  _____

## 2019-01-14 NOTE — Therapy (Signed)
[place] MAIN [facility] SERVICES [address] [place], [place], [address] Phone: [phone]   Fax:  [phone]  Physical Therapy Treatment  Patient Details  Name: Lori Wiggins MRN: [id number] Date of Birth: 05-03-1928 Referring Provider (PT): Vaillancourt   Encounter Date: 01/13/2019  PT End of Session - 01/14/19 1513    Visit Number  12    Number of Visits  20    Date for PT Re-Evaluation  03/04/19   PN 12/23/18   Authorization - Visit Number  12    Authorization - Number of Visits  20    PT Start Time  2094    PT Stop Time  1100    PT Time Calculation (min)  58 min    Activity Tolerance  No increased pain    Behavior During Therapy  WFL for tasks assessed/performed       Past Medical History:  Diagnosis Date  . Arthritis   . Breast cancer ([place]) 2012   left  breast  . Equilibrium disorder   . GERD (gastroesophageal reflux disease)   . Hypertension   . Hypothyroidism   . Personal history of radiation therapy   . Sleep apnea    uses CPAP    Past Surgical History:  Procedure Laterality Date  . ABDOMINAL HYSTERECTOMY    . APPENDECTOMY    . BOTOX INJECTION N/A 09/22/2018   Procedure: Bladder BOTOX INJECTION;  Surgeon: Hollice Espy, MD;  Location: [facility];  Service: Urology;  Laterality: N/A;  General vs MAC for anesthesia  . BREAST BIOPSY Left 2012   Positive  . BREAST EXCISIONAL BIOPSY Right    at age 63's  . BREAST LUMPECTOMY Left 2012   F/U radiation   . BREAST LUMPECTOMY WITH AXILLARY LYMPH NODE BIOPSY Left 2012   with Radiation  . BREAST SURGERY Left 2012   Lumpectomy  . CARDIAC CATHETERIZATION     10 years ago  . CATARACT EXTRACTION W/ INTRAOCULAR LENS  IMPLANT, BILATERAL Bilateral   . COLONOSCOPY    . JOINT REPLACEMENT    . TONSILLECTOMY    . TOTAL KNEE ARTHROPLASTY Left 07/21/2012   Dr Mayer Camel  . TOTAL KNEE ARTHROPLASTY Left 07/21/2012   Procedure: TOTAL KNEE ARTHROPLASTY;  Surgeon: Kerin Salen, MD;   Location: [place];  Service: Orthopedics;  Laterality: Left;    There were no vitals filed for this visit.  Subjective Assessment - 01/13/19 1023    Subjective  Pt reports she is not noticing the little sac hanging down in her vagina as much at night. Pt is able to make it to the bathroom more of the time.  Pt also remembers to squeeze her muscles when sneezing    Pertinent History  2 vaginal deliveries with episiotomies.  Abdominal hysterectomy, appendectomy, L TKA, 3-4 years ago, pt went to [place] and started having equilibrium problems. Pt underwent therapy and still have some equilibrium problems.    Patient Stated Goals  stop leaking         [facility] PT Assessment - 01/14/19 1515      Observation/Other Assessments   Observations  increased stability with yellow bands in arm with swimmers exercises       Palpation   Palpation comment  suprapubic fascial mobilization, ischiocavernosus/ bulbospongiosus B fascial restrictions                 Pelvic Floor Special Questions - 01/14/19 1516    External Palpation  seated quick  contractions with glut overuse      More sequential co-activation of pelvic floor and then abdominal mm    [facility] Adult PT Treatment/Exercise - 01/14/19 1514      Neuro Re-ed    Neuro Re-ed Details   cued for less glut overuse with seated pelvic floor contractions , cued for scapula stabilization with yellow band exercise,  sit to stand with red band above hips x 15 reps , seated shoulder ER with yellow bands x 20 reps       Manual Therapy   Other Manual Therapy  suprapubic fascial mobilization, ischiocavernosus/ bulbospongiosus B releases                   PT Long Term Goals - 12/30/18 1102      PT LONG TERM GOAL #1   Title  Pt will demo IND with HEP    Time  10    Period  Weeks    Status  Achieved      PT LONG TERM GOAL #2   Title  Pt will demo deep core coordination and proper technique for level 1 and 2 exercises to promote  intraabdominal pressure system for continence    Time  4    Period  Weeks    Status  Achieved      PT LONG TERM GOAL #3   Title  Pt will increase gait speed from 0.54ms to > 0.8 m/s in order to make it to the bathroom without leakage    Baseline  10-20-18 (2nd visit) 0.77m (good carry over from last visit).  12-30-18 ( 1.02 m/s)    Time  10    Period  Weeks    Status  Achieved      PT LONG TERM GOAL #4   Title  Pt will demo decreased abdominal scar restrictions in order to optimize TrA co-activation with diahphragm and pelvic floor to gain continence    Baseline  10-20-18- decreased abdominal scar restrictions post manual treatment.    Time  4    Period  Weeks    Status  Achieved      PT LONG TERM GOAL #5   Title  Pt wil demo less gait deviations ( R trunk lean 2/2 lumbar scoliotic curve) with compliance wearing shoe lift in order to adjust for leg length difference and to walk with longer stride length    Baseline  10-20-18 Pt demonstrated increased upright posture, and reports compliance with heel lift.    Time  2    Period  Weeks    Status  Achieved      PT LONG TERM GOAL #6   Title  Pt will report decreasing use of pull up pad from 2 x day to < 1 x day in order to improve QOL    Time  10    Period  Weeks    Status  Achieved            Plan - 01/14/19 1518    Clinical Impression Statement  Pt progressed with yellow resistance band in seated and standing positions without difficulty. Simplified to one exercise into HEP among 3 new upgrade of balance/ hip strengthening/ thoracolumbar strengthening exercises to help maintain compliance and carry over with learning. Pt tolerated fascial releases over suprapubic area to facilitate more upward mobility of pelvic floor for less prolapse. Pt demo'd improved sequential co-activation of pelvic floor and then abdominal mm with exhalation and kegels. Pt continues to benefit  from skilled PT    Personal Factors and Comorbidities  Age     Examination-Activity Limitations  Continence;Toileting    Stability/Clinical Decision Making  Evolving/Moderate complexity    Rehab Potential  Good    PT Frequency  1x / week    PT Duration  Other (comment)   10   PT Treatment/Interventions  Gait training;Therapeutic exercise;Balance training;Neuromuscular re-education;Therapeutic activities;Manual lymph drainage;Manual techniques;Scar mobilization;Moist Heat;Spinal Manipulations;Patient/family education;Passive range of motion;Stair training    PT Next Visit Plan  reassess scar mobility,  scapular mobility, QL involvement; open up R side; increased proprioception of pelvic tilts; increase thoracic mobility; **posture taping; step 2 of urge suppression (based on internal or external PFM assessment)-- address hip adductor TPs for improved pelvic floor engagment.    PT Home Exercise Plan  sit<>stand edu; log rolling edu; standing R side stretch; open book to L; scar mobilization with rotation and with pelvic tilts; posture education; diaphragmatic breathing for part 1 of urge suppression; scapular retraction and depression; hook-lying posterior pelvic tilts (cues for coordination)    Consulted and Agree with Plan of Care  Patient       Patient will benefit from skilled therapeutic intervention in order to improve the following deficits and impairments:  Abnormal gait, Hypomobility, Difficulty walking, Increased muscle spasms, Improper body mechanics, Decreased scar mobility, Decreased coordination, Decreased activity tolerance, Decreased strength, Postural dysfunction, Decreased endurance, Decreased balance  Visit Diagnosis: Other lack of coordination  Muscle weakness (generalized)  Unequal leg length     Problem List Patient Active Problem List   Diagnosis Date Noted  . Ataxia 02/04/2017  . Sinus bradycardia 02/04/2017  . Dizziness 01/21/2017  . History of stroke 01/21/2017  . Constipation 08/04/2012  . Unspecified hypothyroidism  07/31/2012  . Obstructive sleep apnea 07/31/2012  . E. coli UTI (urinary tract infection), fluoroquinolone resistant 07/26/2012  . Essential hypertension, benign 07/26/2012  . Ileus, postoperative ([place]) 07/24/2012  . Hyponatremia 07/24/2012  . Arthritis of knee, left 07/21/2012    Jerl Mina ,PT, DPT, E-RYT  01/14/2019, 3:21 PM  [place] MAIN [facility] SERVICES [address] [place], [place], [address] Phone: [phone]   Fax:  [phone]  Name: Lori Wiggins MRN: [id number] Date of Birth: October 25, 1928

## 2019-01-19 DIAGNOSIS — R42 Dizziness and giddiness: Secondary | ICD-10-CM | POA: Diagnosis not present

## 2019-01-19 DIAGNOSIS — M79605 Pain in left leg: Secondary | ICD-10-CM | POA: Diagnosis not present

## 2019-01-19 DIAGNOSIS — I10 Essential (primary) hypertension: Secondary | ICD-10-CM | POA: Diagnosis not present

## 2019-01-19 DIAGNOSIS — J309 Allergic rhinitis, unspecified: Secondary | ICD-10-CM | POA: Diagnosis not present

## 2019-01-20 ENCOUNTER — Other Ambulatory Visit: Payer: Self-pay

## 2019-01-20 ENCOUNTER — Ambulatory Visit: Payer: PPO | Admitting: Physical Therapy

## 2019-01-20 DIAGNOSIS — R278 Other lack of coordination: Secondary | ICD-10-CM

## 2019-01-20 DIAGNOSIS — M6281 Muscle weakness (generalized): Secondary | ICD-10-CM

## 2019-01-20 DIAGNOSIS — R2689 Other abnormalities of gait and mobility: Secondary | ICD-10-CM

## 2019-01-20 DIAGNOSIS — M2170 Unequal limb length (acquired), unspecified site: Secondary | ICD-10-CM

## 2019-01-20 NOTE — Patient Instructions (Addendum)
When you are doing the pelvic floor contraction correctly  You will notice you not moving your thigh /glut/ knee muscles   Be sure your feet are firm on the bed / floor  __  Be sure to not use soap when cleaning vagina,   Use water to spray the area clean

## 2019-01-20 NOTE — Therapy (Signed)
[place] MAIN [facility] SERVICES [address] [place], [place], [address] Phone: [phone]   Fax:  [phone]  Physical Therapy Treatment  Patient Details  Name: Lori Wiggins MRN: [id number] Date of Birth: 07/09/1928 Referring Provider (PT): Vaillancourt   Encounter Date: 01/20/2019  PT End of Session - 01/20/19 1448    Visit Number  13    Number of Visits  20    Date for PT Re-Evaluation  03/04/19   PN 12/23/18   Authorization - Visit Number  13    Authorization - Number of Visits  20    PT Start Time  9983    PT Stop Time  1458    PT Time Calculation (min)  41 min    Activity Tolerance  No increased pain    Behavior During Therapy  [facility] for tasks assessed/performed       Past Medical History:  Diagnosis Date  . Arthritis   . Breast cancer ([place]) 2012   left  breast  . Equilibrium disorder   . GERD (gastroesophageal reflux disease)   . Hypertension   . Hypothyroidism   . Personal history of radiation therapy   . Sleep apnea    uses CPAP    Past Surgical History:  Procedure Laterality Date  . ABDOMINAL HYSTERECTOMY    . APPENDECTOMY    . BOTOX INJECTION N/A 09/22/2018   Procedure: Bladder BOTOX INJECTION;  Surgeon: Hollice Espy, MD;  Location: [facility];  Service: Urology;  Laterality: N/A;  General vs MAC for anesthesia  . BREAST BIOPSY Left 2012   Positive  . BREAST EXCISIONAL BIOPSY Right    at age 67's  . BREAST LUMPECTOMY Left 2012   F/U radiation   . BREAST LUMPECTOMY WITH AXILLARY LYMPH NODE BIOPSY Left 2012   with Radiation  . BREAST SURGERY Left 2012   Lumpectomy  . CARDIAC CATHETERIZATION     10 years ago  . CATARACT EXTRACTION W/ INTRAOCULAR LENS  IMPLANT, BILATERAL Bilateral   . COLONOSCOPY    . JOINT REPLACEMENT    . TONSILLECTOMY    . TOTAL KNEE ARTHROPLASTY Left 07/21/2012   Dr Mayer Camel  . TOTAL KNEE ARTHROPLASTY Left 07/21/2012   Procedure: TOTAL KNEE ARTHROPLASTY;  Surgeon: Kerin Salen, MD;   Location: [place];  Service: Orthopedics;  Laterality: Left;    There were no vitals filed for this visit.  Subjective Assessment - 01/20/19 1423    Subjective  Pt had no problems with the band exercises from last week. Pt has had a stinging feeling when she pees. The stinging feeling went away when lift the back bone up exercise that week. But has returned  this week. Pt uses soap to clean her vagina and did not know to not use soap    Pertinent History  2 vaginal deliveries with episiotomies.  Abdominal hysterectomy, appendectomy, L TKA, 3-4 years ago, pt went to [place] and started having equilibrium problems. Pt underwent therapy and still have some equilibrium problems.    Patient Stated Goals  stop leaking                    Pelvic Floor Special Questions - 01/20/19 1459    External Palpation  diaper doffed, pt consented to external assessment today:  no mm tightness over anterior pelvic floor, pelvic floor contraction with overuse of adductor and glut mm          [facility] Adult PT Treatment/Exercise -  01/20/19 1656      Neuro Re-ed    Neuro Re-ed Details   tactile cues for less adductor/ glut overuse in hooklying and seated                  PT Long Term Goals - 12/30/18 1102      PT LONG TERM GOAL #1   Title  Pt will demo IND with HEP    Time  10    Period  Weeks    Status  Achieved      PT LONG TERM GOAL #2   Title  Pt will demo deep core coordination and proper technique for level 1 and 2 exercises to promote intraabdominal pressure system for continence    Time  4    Period  Weeks    Status  Achieved      PT LONG TERM GOAL #3   Title  Pt will increase gait speed from 0.86ms to > 0.8 m/s in order to make it to the bathroom without leakage    Baseline  10-20-18 (2nd visit) 0.788m (good carry over from last visit).  12-30-18 ( 1.02 m/s)    Time  10    Period  Weeks    Status  Achieved      PT LONG TERM GOAL #4   Title  Pt will demo decreased  abdominal scar restrictions in order to optimize TrA co-activation with diahphragm and pelvic floor to gain continence    Baseline  10-20-18- decreased abdominal scar restrictions post manual treatment.    Time  4    Period  Weeks    Status  Achieved      PT LONG TERM GOAL #5   Title  Pt wil demo less gait deviations ( R trunk lean 2/2 lumbar scoliotic curve) with compliance wearing shoe lift in order to adjust for leg length difference and to walk with longer stride length    Baseline  10-20-18 Pt demonstrated increased upright posture, and reports compliance with heel lift.    Time  2    Period  Weeks    Status  Achieved      PT LONG TERM GOAL #6   Title  Pt will report decreasing use of pull up pad from 2 x day to < 1 x day in order to improve QOL    Time  10    Period  Weeks    Status  Achieved            Plan - 01/20/19 1449    Clinical Impression Statement Pt arrived late. Pt demo'd better technique for pelvic floor with tactile cues and verbal cues for more feet co-activation in order to correct less overuse of glut and addutor mm with pelvic floor coordination/ strengthening.   Pt reported stinging in her vagina that comes and goes. Pt reported she had this occur for a long time. It resolved when her prolapse improved with a exercise but it has  Returned this week. Upon further questioning, pt reported she washed her vagina with soap. Pt was educated on vaginal  self-cleaning mechanisms and advised to stop using soap and apply water spray instead. Pt voiced understanding.   Discussed decreasing appts after next session to bimonthly. Plan to continue assessing whether burning with urination still occurs at next session after compliance with no using soap for this week when cleaning vagina. Still withholding internal pelvic floor assessment due to her complaint and to pt's preference for  external assessment.   Pt continues to benefit from skilled PT.      Personal Factors  and Comorbidities  Age    Examination-Activity Limitations  Continence;Toileting    Stability/Clinical Decision Making  Evolving/Moderate complexity    Rehab Potential  Good    PT Frequency  1x / week    PT Duration  Other (comment)   10   PT Treatment/Interventions  Gait training;Therapeutic exercise;Balance training;Neuromuscular re-education;Therapeutic activities;Manual lymph drainage;Manual techniques;Scar mobilization;Moist Heat;Spinal Manipulations;Patient/family education;Passive range of motion;Stair training    PT Next Visit Plan  reassess scar mobility,  scapular mobility, QL involvement; open up R side; increased proprioception of pelvic tilts; increase thoracic mobility; **posture taping; step 2 of urge suppression (based on internal or external PFM assessment)-- address hip adductor TPs for improved pelvic floor engagment.    PT Home Exercise Plan  sit<>stand edu; log rolling edu; standing R side stretch; open book to L; scar mobilization with rotation and with pelvic tilts; posture education; diaphragmatic breathing for part 1 of urge suppression; scapular retraction and depression; hook-lying posterior pelvic tilts (cues for coordination)    Consulted and Agree with Plan of Care  Patient       Patient will benefit from skilled therapeutic intervention in order to improve the following deficits and impairments:  Abnormal gait, Hypomobility, Difficulty walking, Increased muscle spasms, Improper body mechanics, Decreased scar mobility, Decreased coordination, Decreased activity tolerance, Decreased strength, Postural dysfunction, Decreased endurance, Decreased balance  Visit Diagnosis: Other lack of coordination  Muscle weakness (generalized)  Unequal leg length  Other abnormalities of gait and mobility     Problem List Patient Active Problem List   Diagnosis Date Noted  . Ataxia 02/04/2017  . Sinus bradycardia 02/04/2017  . Dizziness 01/21/2017  . History of stroke  01/21/2017  . Constipation 08/04/2012  . Unspecified hypothyroidism 07/31/2012  . Obstructive sleep apnea 07/31/2012  . E. coli UTI (urinary tract infection), fluoroquinolone resistant 07/26/2012  . Essential hypertension, benign 07/26/2012  . Ileus, postoperative ([place]) 07/24/2012  . Hyponatremia 07/24/2012  . Arthritis of knee, left 07/21/2012    Jerl Mina ,PT, DPT, E-RYT  01/20/2019, 5:54 PM  [place] MAIN [facility] SERVICES [address] [place], [place], [address] Phone: [phone]   Fax:  [phone]  Name: Lori Wiggins MRN: [id number] Date of Birth: 01/03/1928

## 2019-01-22 DIAGNOSIS — R42 Dizziness and giddiness: Secondary | ICD-10-CM | POA: Diagnosis not present

## 2019-01-22 DIAGNOSIS — N3281 Overactive bladder: Secondary | ICD-10-CM | POA: Diagnosis not present

## 2019-01-22 DIAGNOSIS — G4700 Insomnia, unspecified: Secondary | ICD-10-CM | POA: Diagnosis not present

## 2019-01-22 DIAGNOSIS — E7800 Pure hypercholesterolemia, unspecified: Secondary | ICD-10-CM | POA: Diagnosis not present

## 2019-01-22 DIAGNOSIS — J309 Allergic rhinitis, unspecified: Secondary | ICD-10-CM | POA: Diagnosis not present

## 2019-01-27 ENCOUNTER — Other Ambulatory Visit: Payer: Self-pay

## 2019-01-27 ENCOUNTER — Ambulatory Visit: Payer: PPO | Admitting: Physical Therapy

## 2019-01-27 DIAGNOSIS — R278 Other lack of coordination: Secondary | ICD-10-CM | POA: Diagnosis not present

## 2019-01-27 DIAGNOSIS — M6281 Muscle weakness (generalized): Secondary | ICD-10-CM

## 2019-01-27 DIAGNOSIS — R2689 Other abnormalities of gait and mobility: Secondary | ICD-10-CM

## 2019-01-27 DIAGNOSIS — M2170 Unequal limb length (acquired), unspecified site: Secondary | ICD-10-CM

## 2019-01-27 NOTE — Therapy (Signed)
[place] MAIN [facility] SERVICES [address] [place], [place], [address] Phone: [phone]   Fax:  [phone]  Physical Therapy Treatment  Patient Details  Name: Lori Wiggins MRN: [id number] Date of Birth: 03-05-1928 Referring Provider (PT): Vaillancourt   Encounter Date: 01/27/2019  PT End of Session - 01/27/19 1325    Visit Number  14    Number of Visits  20    Date for PT Re-Evaluation  03/04/19   PN 12/23/18   Authorization - Visit Number  13    Authorization - Number of Visits  20    PT Start Time  1010    PT Stop Time  1105    PT Time Calculation (min)  55 min    Activity Tolerance  No increased pain    Behavior During Therapy  WFL for tasks assessed/performed       Past Medical History:  Diagnosis Date  . Arthritis   . Breast cancer ([place]) 2012   left  breast  . Equilibrium disorder   . GERD (gastroesophageal reflux disease)   . Hypertension   . Hypothyroidism   . Personal history of radiation therapy   . Sleep apnea    uses CPAP    Past Surgical History:  Procedure Laterality Date  . ABDOMINAL HYSTERECTOMY    . APPENDECTOMY    . BOTOX INJECTION N/A 09/22/2018   Procedure: Bladder BOTOX INJECTION;  Surgeon: Hollice Espy, MD;  Location: [facility];  Service: Urology;  Laterality: N/A;  General vs MAC for anesthesia  . BREAST BIOPSY Left 2012   Positive  . BREAST EXCISIONAL BIOPSY Right    at age 8's  . BREAST LUMPECTOMY Left 2012   F/U radiation   . BREAST LUMPECTOMY WITH AXILLARY LYMPH NODE BIOPSY Left 2012   with Radiation  . BREAST SURGERY Left 2012   Lumpectomy  . CARDIAC CATHETERIZATION     10 years ago  . CATARACT EXTRACTION W/ INTRAOCULAR LENS  IMPLANT, BILATERAL Bilateral   . COLONOSCOPY    . JOINT REPLACEMENT    . TONSILLECTOMY    . TOTAL KNEE ARTHROPLASTY Left 07/21/2012   Dr Mayer Camel  . TOTAL KNEE ARTHROPLASTY Left 07/21/2012   Procedure: TOTAL KNEE ARTHROPLASTY;  Surgeon: Kerin Salen, MD;   Location: [place];  Service: Orthopedics;  Laterality: Left;    There were no vitals filed for this visit.  Subjective Assessment - 01/27/19 1012    Subjective  Pt is not making it to the bathroom from her kitchen all the time before leaking. Sometimes she can make it before leaking. Pt is concerned about her husband who has had difficulty getting words out and getting confused when driving. Pt has been concerned and was able to get him to a cardiologist who referred him to a neurologist for further evaluation. Pt and husband have agreed that he will stop driving until his neurologist consult.  Pt started walking 1.25 mile daily depending on weather.    Pertinent History  2 vaginal deliveries with episiotomies.  Abdominal hysterectomy, appendectomy, L TKA, 3-4 years ago, pt went to [place] and started having equilibrium problems. Pt underwent therapy and still have some equilibrium problems.    Patient Stated Goals  stop leaking         [facility] PT Assessment - 01/27/19 1329      Observation/Other Assessments   Observations  tried to advance pt to red band for shoudler ER/ thoracic ext HEP but too  difficult      Squat   Comments  genu valgus      Strength   Overall Strength Comments  BLE 3+/5       Ambulation/Gait   Gait Comments  short strides , more upright posture                    [facility] Adult PT Treatment/Exercise - 01/27/19 1326      Neuro Re-ed    Neuro Re-ed Details   cued for minisquat and side step technique for HEP, reviewed past HEP and provided cues for technique to promote thoracic extension , less genu valgus                   PT Long Term Goals - 12/30/18 1102      PT LONG TERM GOAL #1   Title  Pt will demo IND with HEP    Time  10    Period  Weeks    Status  Achieved      PT LONG TERM GOAL #2   Title  Pt will demo deep core coordination and proper technique for level 1 and 2 exercises to promote intraabdominal pressure system for continence     Time  4    Period  Weeks    Status  Achieved      PT LONG TERM GOAL #3   Title  Pt will increase gait speed from 0.24ms to > 0.8 m/s in order to make it to the bathroom without leakage    Baseline  10-20-18 (2nd visit) 0.734m (good carry over from last visit).  12-30-18 ( 1.02 m/s)    Time  10    Period  Weeks    Status  Achieved      PT LONG TERM GOAL #4   Title  Pt will demo decreased abdominal scar restrictions in order to optimize TrA co-activation with diahphragm and pelvic floor to gain continence    Baseline  10-20-18- decreased abdominal scar restrictions post manual treatment.    Time  4    Period  Weeks    Status  Achieved      PT LONG TERM GOAL #5   Title  Pt wil demo less gait deviations ( R trunk lean 2/2 lumbar scoliotic curve) with compliance wearing shoe lift in order to adjust for leg length difference and to walk with longer stride length    Baseline  10-20-18 Pt demonstrated increased upright posture, and reports compliance with heel lift.    Time  2    Period  Weeks    Status  Achieved      PT LONG TERM GOAL #6   Title  Pt will report decreasing use of pull up pad from 2 x day to < 1 x day in order to improve QOL    Time  10    Period  Weeks    Status  Achieved            Plan - 01/27/19 1326    Clinical Impression Statement Pt continues to report making it to the bathroom without leakage sometimes but not all the time.  Addressing BLE overall weakness today with new HEP. Gait stride is short until cued to make longer steps. Spinal alignment and scoliosis/ leg length deviations remain corrected from past sessions. Created a chart for all exercises and pt agreed to have new and one old exercise filmed on phone to help with retention . Pt  remains compliant and motivated. Pt continues to benefit from skilled PT.    Personal Factors and Comorbidities  Age    Examination-Activity Limitations  Continence;Toileting    Stability/Clinical Decision Making   Evolving/Moderate complexity    Rehab Potential  Good    PT Frequency  1x / week    PT Duration  Other (comment)   10   PT Treatment/Interventions  Gait training;Therapeutic exercise;Balance training;Neuromuscular re-education;Therapeutic activities;Manual lymph drainage;Manual techniques;Scar mobilization;Moist Heat;Spinal Manipulations;Patient/family education;Passive range of motion;Stair training            Consulted and Agree with Plan of Care  Patient       Patient will benefit from skilled therapeutic intervention in order to improve the following deficits and impairments:  Abnormal gait, Hypomobility, Difficulty walking, Increased muscle spasms, Improper body mechanics, Decreased scar mobility, Decreased coordination, Decreased activity tolerance, Decreased strength, Postural dysfunction, Decreased endurance, Decreased balance  Visit Diagnosis: Other lack of coordination  Muscle weakness (generalized)  Unequal leg length  Other abnormalities of gait and mobility     Problem List Patient Active Problem List   Diagnosis Date Noted  . Ataxia 02/04/2017  . Sinus bradycardia 02/04/2017  . Dizziness 01/21/2017  . History of stroke 01/21/2017  . Constipation 08/04/2012  . Unspecified hypothyroidism 07/31/2012  . Obstructive sleep apnea 07/31/2012  . E. coli UTI (urinary tract infection), fluoroquinolone resistant 07/26/2012  . Essential hypertension, benign 07/26/2012  . Ileus, postoperative ([place]) 07/24/2012  . Hyponatremia 07/24/2012  . Arthritis of knee, left 07/21/2012    Jerl Mina ,PT, DPT, E-RYT  01/27/2019, 1:33 PM  [place] MAIN [facility] SERVICES [address] [place], [place], [address] Phone: [phone]   Fax:  [phone]  Name: CARLO LORSON MRN: [id number] Date of Birth: [birth year]

## 2019-01-27 NOTE — Patient Instructions (Signed)
   Exercise check off Twice   Mon Tue Wed Thurs Fri  Sat Sun  Open book          Lying on back, pelvic squeeze, 10 x          Inhale, do nothing exhale, move one knee out- 31min            seated  . yelow band Inhale, hold band horizontal Exhale - pull in diagonal  2 min         Zig zag + squat down hallway 2 laps                 Three Times a day   At breakfast, lunch , dinner  Mon Tue Wed Thurs Fri  Sat Sun   Quick pelvic floor squeezes    Gentle expansion at the ribs on inhale Exhale gentle lift of pelvic floor, no thigh squeezing Adding in seated quick pelvic floor contractions    At breakfast, lunch , dinner  Seated in chair  10 reps Keep count with fingers

## 2019-02-10 ENCOUNTER — Ambulatory Visit: Payer: PPO | Attending: Physician Assistant | Admitting: Physical Therapy

## 2019-02-10 ENCOUNTER — Other Ambulatory Visit: Payer: Self-pay

## 2019-02-10 DIAGNOSIS — J309 Allergic rhinitis, unspecified: Secondary | ICD-10-CM | POA: Diagnosis not present

## 2019-02-10 DIAGNOSIS — M2170 Unequal limb length (acquired), unspecified site: Secondary | ICD-10-CM | POA: Insufficient documentation

## 2019-02-10 DIAGNOSIS — R42 Dizziness and giddiness: Secondary | ICD-10-CM | POA: Diagnosis not present

## 2019-02-10 DIAGNOSIS — R2689 Other abnormalities of gait and mobility: Secondary | ICD-10-CM | POA: Diagnosis not present

## 2019-02-10 DIAGNOSIS — R278 Other lack of coordination: Secondary | ICD-10-CM | POA: Insufficient documentation

## 2019-02-10 DIAGNOSIS — M6281 Muscle weakness (generalized): Secondary | ICD-10-CM | POA: Insufficient documentation

## 2019-02-10 DIAGNOSIS — N390 Urinary tract infection, site not specified: Secondary | ICD-10-CM | POA: Diagnosis not present

## 2019-02-10 DIAGNOSIS — G4700 Insomnia, unspecified: Secondary | ICD-10-CM | POA: Diagnosis not present

## 2019-02-10 NOTE — Patient Instructions (Signed)
While you are taking antibiotics  Do not do any pelvic floor squeezes   ___  Other exercises   1) Zig zag walking 2) Deep core level  2 ( knee out one at a time)  6 min    3) Clam Shell 45 Degrees   Lying with hips and knees bent 45, one pillow between knees and ankles. Lift knee with exhale. Be sure pelvis does not roll backward. Do not arch back. Do 20 times, each leg, 2 times per day.

## 2019-02-11 ENCOUNTER — Encounter: Payer: Self-pay | Admitting: Podiatry

## 2019-02-11 ENCOUNTER — Ambulatory Visit: Payer: PPO | Admitting: Podiatry

## 2019-02-11 ENCOUNTER — Other Ambulatory Visit: Payer: Self-pay

## 2019-02-11 DIAGNOSIS — B351 Tinea unguium: Secondary | ICD-10-CM

## 2019-02-11 DIAGNOSIS — M79676 Pain in unspecified toe(s): Secondary | ICD-10-CM | POA: Diagnosis not present

## 2019-02-11 NOTE — Progress Notes (Signed)
She presents today chief complaint of painful elongated toenails.  Objective: Vital signs are stable she is alert oriented x3.  No erythema edema cellulitis drainage or odor toenails are long thick yellow dystrophic-like mycotic painful palpation as well as debridement.  No open lesions or wounds are noted.  Assessment: Pain in limb secondary to onychomycosis.  Plan: Debridement of toenails 1 through 5 bilateral.

## 2019-02-11 NOTE — Therapy (Signed)
[place] MAIN [facility] SERVICES [address] [place], [place], [address] Phone: [phone]   Fax:  [phone]  Physical Therapy Treatment  Patient Details  Name: Lori Wiggins MRN: [id number] Date of Birth: 15-Sep-1928 Referring Provider (PT): Vaillancourt   Encounter Date: 02/10/2019  PT End of Session - 02/11/19 2229    Visit Number  15    Number of Visits  20    Date for PT Re-Evaluation  03/04/19   PN 12/23/18   Authorization - Visit Number  15    Authorization - Number of Visits  20    PT Start Time  6962    PT Stop Time  1430    PT Time Calculation (min)  56 min    Activity Tolerance  No increased pain    Behavior During Therapy  [facility] for tasks assessed/performed       Past Medical History:  Diagnosis Date  . Arthritis   . Breast cancer ([place]) 2012   left  breast  . Equilibrium disorder   . GERD (gastroesophageal reflux disease)   . Hypertension   . Hypothyroidism   . Personal history of radiation therapy   . Sleep apnea    uses CPAP    Past Surgical History:  Procedure Laterality Date  . ABDOMINAL HYSTERECTOMY    . APPENDECTOMY    . BOTOX INJECTION N/A 09/22/2018   Procedure: Bladder BOTOX INJECTION;  Surgeon: Hollice Espy, MD;  Location: [facility] ORS;  Service: Urology;  Laterality: N/A;  General vs MAC for anesthesia  . BREAST BIOPSY Left 2012   Positive  . BREAST EXCISIONAL BIOPSY Right    at age 36's  . BREAST LUMPECTOMY Left 2012   F/U radiation   . BREAST LUMPECTOMY WITH AXILLARY LYMPH NODE BIOPSY Left 2012   with Radiation  . BREAST SURGERY Left 2012   Lumpectomy  . CARDIAC CATHETERIZATION     10 years ago  . CATARACT EXTRACTION W/ INTRAOCULAR LENS  IMPLANT, BILATERAL Bilateral   . COLONOSCOPY    . JOINT REPLACEMENT    . TONSILLECTOMY    . TOTAL KNEE ARTHROPLASTY Left 07/21/2012   Dr Mayer Camel  . TOTAL KNEE ARTHROPLASTY Left 07/21/2012   Procedure: TOTAL KNEE ARTHROPLASTY;  Surgeon: Kerin Salen, MD;   Location: [place];  Service: Orthopedics;  Laterality: Left;    There were no vitals filed for this visit.  Subjective Assessment - 02/11/19 2244    Subjective  Pt is really worried about her husband who has been having trouble getting his words out. Over the weekend, pt stopped her pelvic exercises bcause she noticed stinging and burning at the vagina. Pt also noticed a small lump outside on the vagina. Pt saw her PCP this morning for urine culture.    Pertinent History  2 vaginal deliveries with episiotomies.  Abdominal hysterectomy, appendectomy, L TKA, 3-4 years ago, pt went to [place] and started having equilibrium problems. Pt underwent therapy and still have some equilibrium problems.    Patient Stated Goals  stop leaking         [facility] PT Assessment - 02/11/19 2226      Ambulation/Gait   Gait Comments  more upright posture, less thoracic kyphosis                 Pelvic Floor Special Questions - 02/11/19 2227    External Perineal Exam  raised red bump ( size of pencil eraser at 5 o'clock position, pt 's  PCP has been notified      Pelvic Floor Internal Exam  withheld due to pt awaiting urine culture to rule in or out UTI    Strength  --   excessive use of gluts       [facility] Adult PT Treatment/Exercise - 02/11/19 2226      Therapeutic Activites    Therapeutic Activities  Other Therapeutic Activities    Other Therapeutic Activities  assessed bump pt reported feeling on vagina L, called PCP and reported this finding       Neuro Re-ed    Neuro Re-ed Details   cued for less overuse of glut / adductors                   PT Long Term Goals - 02/11/19 2230      PT LONG TERM GOAL #1   Title  Pt will demo IND with HEP    Time  10    Period  Weeks    Status  Achieved      PT LONG TERM GOAL #2   Title  Pt will demo deep core coordination and proper technique for level 1 and 2 exercises to promote intraabdominal pressure system for continence    Time  4    Period   Weeks    Status  Achieved      PT LONG TERM GOAL #3   Title  Pt will increase gait speed from 0.79ms to > 0.8 m/s in order to make it to the bathroom without leakage    Baseline  10-20-18 (2nd visit) 0.754m (good carry over from last visit).  12-30-18 ( 1.02 m/s)    Time  10    Period  Weeks    Status  Achieved      PT LONG TERM GOAL #4   Title  Pt will demo decreased abdominal scar restrictions in order to optimize TrA co-activation with diahphragm and pelvic floor to gain continence    Baseline  10-20-18- decreased abdominal scar restrictions post manual treatment.    Time  4    Period  Weeks    Status  Achieved      PT LONG TERM GOAL #5   Title  Pt wil demo less gait deviations ( R trunk lean 2/2 lumbar scoliotic curve) with compliance wearing shoe lift in order to adjust for leg length difference and to walk with longer stride length    Baseline  10-20-18 Pt demonstrated increased upright posture, and reports compliance with heel lift.    Time  2    Period  Weeks    Status  Achieved      Additional Long Term Goals   Additional Long Term Goals  Yes      PT LONG TERM GOAL #6   Title  Pt will report decreasing use of pull up pad from 2 x day to < 1 x day in order to improve QOL    Time  10    Period  Weeks    Status  Achieved      PT LONG TERM GOAL #7   Title  Pt will demo no overuse of glut/ adductors with cue for pelvic floor contractions in order to minimize leakage    Time  4    Period  Weeks    Status  New            Plan - 02/11/19 2230    Clinical Impression Statement  Pt returned  after 2 weeks with good carry over with upright posture and quicker gait speed. Pt reported she has been doing her exercises but she noticed stinging and burning in her vagina and has seen her PCP for urine culture and was prescribed antibiotics. Pt also reported she noticed a bump on the outside of the vagina. TOday, assessed externally and noted  raised red bump ( size of pencil  eraser at 5 o'clock position L  labia.  Pt 's PCP has been notified. Pt was corrected on her pelvic floor exercises to not engage in her glut/ adductors. Pt demo'd correctly after training. Emphasized to pt to withhold from pelvic floor squeezes until she completes her antibiotics in case but to perform the other exercises. Encouraged pt will continue to improve despite relapse of frequency and leakage.  Emphasized not to over tighthen the pelvic floor mm and soap is not advised over the vagina.   Pt has expressed concern for her husband's confusion and difficulty with words have worsened more significantly recently.  Withheld internal pelvic floor assessment. Continue to monitor red bump at L labia at next session.  Pt continues to benefit from skilled PT.    Personal Factors and Comorbidities  Age    Examination-Activity Limitations  Continence;Toileting    Stability/Clinical Decision Making  Evolving/Moderate complexity    Rehab Potential  Good    PT Frequency  1x / week    PT Duration  Other (comment)   10   PT Treatment/Interventions  Gait training;Therapeutic exercise;Balance training;Neuromuscular re-education;Therapeutic activities;Manual lymph drainage;Manual techniques;Scar mobilization;Moist Heat;Spinal Manipulations;Patient/family education;Passive range of motion;Stair training    Consulted and Agree with Plan of Care  Patient       Patient will benefit from skilled therapeutic intervention in order to improve the following deficits and impairments:  Abnormal gait, Hypomobility, Difficulty walking, Increased muscle spasms, Improper body mechanics, Decreased scar mobility, Decreased coordination, Decreased activity tolerance, Decreased strength, Postural dysfunction, Decreased endurance, Decreased balance  Visit Diagnosis: Other lack of coordination  Muscle weakness (generalized)  Unequal leg length  Other abnormalities of gait and mobility     Problem List Patient Active  Problem List   Diagnosis Date Noted  . Ataxia 02/04/2017  . Sinus bradycardia 02/04/2017  . Dizziness 01/21/2017  . History of stroke 01/21/2017  . Constipation 08/04/2012  . Unspecified hypothyroidism 07/31/2012  . Obstructive sleep apnea 07/31/2012  . E. coli UTI (urinary tract infection), fluoroquinolone resistant 07/26/2012  . Essential hypertension, benign 07/26/2012  . Ileus, postoperative ([place]) 07/24/2012  . Hyponatremia 07/24/2012  . Arthritis of knee, left 07/21/2012    Jerl Mina ,PT, DPT, E-RYT  02/11/2019, 10:45 PM  [place] MAIN [facility] SERVICES [address] [place], [place], [address] Phone: [phone]   Fax:  [phone]  Name: Lori Wiggins MRN: [id number] Date of Birth: 1928-05-20

## 2019-02-16 DIAGNOSIS — N390 Urinary tract infection, site not specified: Secondary | ICD-10-CM | POA: Diagnosis not present

## 2019-02-16 DIAGNOSIS — R42 Dizziness and giddiness: Secondary | ICD-10-CM | POA: Diagnosis not present

## 2019-02-16 DIAGNOSIS — I10 Essential (primary) hypertension: Secondary | ICD-10-CM | POA: Diagnosis not present

## 2019-02-16 DIAGNOSIS — N9089 Other specified noninflammatory disorders of vulva and perineum: Secondary | ICD-10-CM | POA: Diagnosis not present

## 2019-02-16 DIAGNOSIS — J309 Allergic rhinitis, unspecified: Secondary | ICD-10-CM | POA: Diagnosis not present

## 2019-02-18 ENCOUNTER — Ambulatory Visit: Payer: PPO | Admitting: Urology

## 2019-02-19 ENCOUNTER — Other Ambulatory Visit: Payer: Self-pay

## 2019-02-19 ENCOUNTER — Emergency Department
Admission: EM | Admit: 2019-02-19 | Discharge: 2019-02-19 | Disposition: A | Discharge status: Home / Self Care | Payer: PPO | Attending: Emergency Medicine | Admitting: Emergency Medicine

## 2019-02-19 DIAGNOSIS — R1010 Upper abdominal pain, unspecified: Secondary | ICD-10-CM | POA: Diagnosis present

## 2019-02-19 DIAGNOSIS — Z79899 Other long term (current) drug therapy: Secondary | ICD-10-CM | POA: Diagnosis not present

## 2019-02-19 DIAGNOSIS — K2900 Acute gastritis without bleeding: Secondary | ICD-10-CM

## 2019-02-19 DIAGNOSIS — I10 Essential (primary) hypertension: Secondary | ICD-10-CM | POA: Insufficient documentation

## 2019-02-19 DIAGNOSIS — E039 Hypothyroidism, unspecified: Secondary | ICD-10-CM | POA: Insufficient documentation

## 2019-02-19 DIAGNOSIS — Z7982 Long term (current) use of aspirin: Secondary | ICD-10-CM | POA: Diagnosis not present

## 2019-02-19 DIAGNOSIS — R079 Chest pain, unspecified: Secondary | ICD-10-CM | POA: Diagnosis not present

## 2019-02-19 DIAGNOSIS — R1084 Generalized abdominal pain: Secondary | ICD-10-CM | POA: Diagnosis not present

## 2019-02-19 DIAGNOSIS — R110 Nausea: Secondary | ICD-10-CM | POA: Diagnosis not present

## 2019-02-19 DIAGNOSIS — R0789 Other chest pain: Secondary | ICD-10-CM | POA: Diagnosis not present

## 2019-02-19 LAB — CBC
HCT: 40.2 % (ref 36.0–46.0)
Hemoglobin: 13 g/dL (ref 12.0–15.0)
MCH: 28 pg (ref 26.0–34.0)
MCHC: 32.3 g/dL (ref 30.0–36.0)
MCV: 86.6 fL (ref 80.0–100.0)
Platelets: 186 10*3/uL (ref 150–400)
RBC: 4.64 MIL/uL (ref 3.87–5.11)
RDW: 14.6 % (ref 11.5–15.5)
WBC: 12.7 10*3/uL — ABNORMAL HIGH (ref 4.0–10.5)
nRBC: 0 % (ref 0.0–0.2)

## 2019-02-19 LAB — COMPREHENSIVE METABOLIC PANEL
ALT: 19 U/L (ref 0–44)
AST: 27 U/L (ref 15–41)
Albumin: 4.2 g/dL (ref 3.5–5.0)
Alkaline Phosphatase: 80 U/L (ref 38–126)
Anion gap: 13 (ref 5–15)
BUN: 21 mg/dL (ref 8–23)
CO2: 22 mmol/L (ref 22–32)
Calcium: 9.4 mg/dL (ref 8.9–10.3)
Chloride: 98 mmol/L (ref 98–111)
Creatinine, Ser: 0.66 mg/dL (ref 0.44–1.00)
GFR calc Af Amer: >60 mL/min (ref >60)
GFR calc non Af Amer: >60 mL/min (ref >60)
Glucose, Bld: 212 mg/dL — ABNORMAL HIGH (ref 70–99)
Potassium: 3.9 mmol/L (ref 3.5–5.1)
Sodium: 133 mmol/L — ABNORMAL LOW (ref 135–145)
Total Bilirubin: 0.8 mg/dL (ref 0.3–1.2)
Total Protein: 7.4 g/dL (ref 6.5–8.1)

## 2019-02-19 LAB — LIPASE, BLOOD: Lipase: 25 U/L (ref 11–51)

## 2019-02-19 MED ORDER — ONDANSETRON HCL 4 MG/2ML IJ SOLN
4.0000 mg | Freq: Once | INTRAMUSCULAR | Status: AC
  Administered 2019-02-19: 4 mg via INTRAVENOUS
  Filled 2019-02-19: qty 2

## 2019-02-19 MED ORDER — LIDOCAINE VISCOUS HCL 2 % MT SOLN
15.0000 mL | Freq: Once | OROMUCOSAL | Status: AC
  Administered 2019-02-19: 15 mL via ORAL
  Filled 2019-02-19: qty 15

## 2019-02-19 MED ORDER — ALUM & MAG HYDROXIDE-SIMETH 200-200-20 MG/5ML PO SUSP
30.0000 mL | Freq: Once | ORAL | Status: AC
  Administered 2019-02-19: 30 mL via ORAL
  Filled 2019-02-19: qty 30

## 2019-02-19 NOTE — ED Triage Notes (Signed)
Patient comes from home with EMS due to abdominal pain that began at 0100 this AM.  Patient states pain is located in upper abdomen and states it is a 3/10 but she cannot take it anymore at this time. States she is on an antibiotic for UTI at this time

## 2019-02-19 NOTE — Discharge Instructions (Signed)
Your abdominal exam and lab tests were reassuring today.  I suspect your abdominal discomfort is related to the antibiotic you took just prior to it starting.  I would anticipate this will improve rapidly with time and rest.  If your symptoms worsen please return to the emergency

## 2019-02-19 NOTE — ED Notes (Signed)
Patient instructed on need for urine sample. Patient states she needs a little time and she will sit on the toilet and provide sample when she is ready

## 2019-02-19 NOTE — ED Notes (Signed)
Pt called daughter at this time for ride home, per pt daughter is coming,

## 2019-02-19 NOTE — ED Provider Notes (Signed)
[facility] Emergency Department Provider Note   ____________________________________________    I have reviewed the triage vital signs and the nursing notes.   HISTORY  Chief Complaint Abdominal Pain     HPI Lori Wiggins is a 86 y.o. female who presents with complaints of abdominal discomfort.  Patient reports that just prior to bed she remembered that she did take an antibiotic which she is being treated for for urinary tract infection.  She reports she took the antibiotic and then started to feel queasy and have some discomfort in her upper abdomen.  She believes this is because she forgot to take the antibiotic with food.  She denies vomiting does have some nausea which is mild.  She reports her abdominal discomfort is a 3 out of 10.  No fevers or chills.  No diarrhea.   Past Medical History:  Diagnosis Date  . Arthritis   . Breast cancer ([place]) 2012   left  breast  . Equilibrium disorder   . GERD (gastroesophageal reflux disease)   . Hypertension   . Hypothyroidism   . Personal history of radiation therapy   . Sleep apnea    uses CPAP    Patient Active Problem List   Diagnosis Date Noted  . Ataxia 02/04/2017  . Sinus bradycardia 02/04/2017  . Dizziness 01/21/2017  . History of stroke 01/21/2017  . Constipation 08/04/2012  . Unspecified hypothyroidism 07/31/2012  . Obstructive sleep apnea 07/31/2012  . E. coli UTI (urinary tract infection), fluoroquinolone resistant 07/26/2012  . Essential hypertension, benign 07/26/2012  . Ileus, postoperative ([place]) 07/24/2012  . Hyponatremia 07/24/2012  . Arthritis of knee, left 07/21/2012    Past Surgical History:  Procedure Laterality Date  . ABDOMINAL HYSTERECTOMY    . APPENDECTOMY    . BOTOX INJECTION N/A 09/22/2018   Procedure: Bladder BOTOX INJECTION;  Surgeon: Hollice Espy, MD;  Location: [facility] ORS;  Service: Urology;  Laterality: N/A;  General vs MAC for anesthesia  . BREAST  BIOPSY Left 2012   Positive  . BREAST EXCISIONAL BIOPSY Right    at age 69's  . BREAST LUMPECTOMY Left 2012   F/U radiation   . BREAST LUMPECTOMY WITH AXILLARY LYMPH NODE BIOPSY Left 2012   with Radiation  . BREAST SURGERY Left 2012   Lumpectomy  . CARDIAC CATHETERIZATION     10 years ago  . CATARACT EXTRACTION W/ INTRAOCULAR LENS  IMPLANT, BILATERAL Bilateral   . COLONOSCOPY    . JOINT REPLACEMENT    . TONSILLECTOMY    . TOTAL KNEE ARTHROPLASTY Left 07/21/2012   Dr Mayer Camel  . TOTAL KNEE ARTHROPLASTY Left 07/21/2012   Procedure: TOTAL KNEE ARTHROPLASTY;  Surgeon: Kerin Salen, MD;  Location: [place];  Service: Orthopedics;  Laterality: Left;    Prior to Admission medications   Medication Sig Start Date End Date Taking? Authorizing Provider  amLODipine (NORVASC) 2.5 MG tablet Take 2.5 mg by mouth 2 (two) times daily.  05/22/16   [provider]  Apoaequorin (PREVAGEN) 10 MG CAPS Take by mouth.    [provider]  aspirin EC 81 MG tablet Take 81 mg by mouth daily.    [provider]  Azelastine HCl 0.15 % SOLN Place 1 spray into both nostrils 2 (two) times daily.  11/08/17   [provider]  Calcium-Vitamin D-Vitamin K (VIACTIV CALCIUM PLUS D PO) Take 1 tablet by mouth daily.    [provider]  Cholecalciferol (VITAMIN D3 PO) Take  1 tablet by mouth daily.    [provider]  ciprofloxacin (CIPRO) 250 MG tablet Take 250 mg by mouth 2 (two) times daily. 02/10/19   [provider]  Coenzyme Q10 (COQ10 PO) Take 1 capsule by mouth daily.    [provider]  fluticasone Asencion Islam) 50 MCG/ACT nasal spray  11/06/18   [provider]  gentamicin ointment (GARAMYCIN) 0.1 % Apply 1 application topically 2 (two) times daily. Apply to bottom of nose for CPAP mask 01/09/16   [provider]  lidocaine (LIDODERM) 5 % Place 1 patch onto the skin every 12 (twelve) hours. Remove & Discard patch within 12 hours or as directed  by MD 09/29/18 09/29/19  Merlyn Lot, MD  MAGNESIUM-ZINC PO Take 1 tablet by mouth daily.    [provider]  Menthol, Topical Analgesic, (BLUE-EMU MAXIMUM STRENGTH EX) Apply 1 application topically 2 (two) times daily as needed (pain).    [provider]  metoprolol succinate (TOPROL-XL) 25 MG 24 hr tablet Take 25 mg by mouth daily.  05/29/16   [provider]  Misc Natural Products (JOINT HEALTH PO) Take 1 capsule by mouth daily. Peak Mobility    [provider]     Allergies Sulfa antibiotics  Family History  Problem Relation Age of Onset  . Alzheimer's disease Mother   . Heart attack Father   . Breast cancer Neg Hx     Social History Social History   Tobacco Use  . Smoking status: Never Smoker  . Smokeless tobacco: Never Used  Substance Use Topics  . Alcohol use: Yes    Alcohol/week: 7.0 standard drinks    Types: 7 Glasses of wine per week    Comment: social  . Drug use: No    Review of Systems  Constitutional: No fever/chills Eyes: No visual changes.  ENT: No sore throat. Cardiovascular: Denies chest pain. Respiratory: Denies shortness of breath. Gastrointestinal: As above Genitourinary: Negative for dysuria. Musculoskeletal: Negative for back pain. Skin: Negative for rash. Neurological: Negative for headaches   ____________________________________________   PHYSICAL EXAM:  VITAL SIGNS: ED Triage Vitals  Enc Vitals Group     BP 02/19/19 0342 (!) 172/74     Pulse Rate 02/19/19 0342 64     Resp 02/19/19 0342 16     Temp 02/19/19 0342 98.1 F (36.7 C)     Temp Source 02/19/19 0342 Oral     SpO2 02/19/19 0342 97 %     Weight 02/19/19 0343 54.4 kg (120 lb)     Height 02/19/19 0343 1.575 m (_0 )     Head Circumference --      Peak Flow --      Pain Score 02/19/19 0343 3     Pain Loc --      Pain Edu? --      Excl. in [place]? --     Constitutional: Alert and oriented.   Nose: No  congestion/rhinnorhea. Mouth/Throat: Mucous membranes are moist.    Cardiovascular: Normal rate, regular rhythm. Grossly normal heart sounds.  Good peripheral circulation. Respiratory: Normal respiratory effort.  No retractions. Lungs CTAB. Gastrointestinal: Soft and nontender. No distention.  No CVA tenderness.  Reassuring exam  Musculoskeletal:  Warm and well perfused Neurologic:  Normal speech and language. No gross focal neurologic deficits are appreciated.  Skin:  Skin is warm, dry and intact. No rash noted. Psychiatric: Mood and affect are normal. Speech and behavior are normal.  ____________________________________________   LABS (all labs  ordered are listed, but only abnormal results are displayed)  Labs Reviewed  CBC - Abnormal; Notable for the following components:      Result Value   WBC 12.7 (*)    All other components within normal limits  COMPREHENSIVE METABOLIC PANEL - Abnormal; Notable for the following components:   Sodium 133 (*)    Glucose, Bld 212 (*)    All other components within normal limits  LIPASE, BLOOD   ____________________________________________  EKG  None ____________________________________________  RADIOLOGY   ____________________________________________   PROCEDURES  Procedure(s) performed: No  Procedures   Critical Care performed: No ____________________________________________   INITIAL IMPRESSION / ASSESSMENT AND PLAN / ED COURSE  Pertinent labs & imaging results that were available during my care of the patient were reviewed by me and considered in my medical decision making (see chart for details).  Patient presents with mild epigastric discomfort after taking antibiotic.  She notes her symptoms have improved in the emergency department.  Abdominal exam is benign.  Lab work is overall reassuring.  Treated with Zofran IV as well as GI cocktail with improvement in symptoms.  No negation for imaging at this time,  appropriate for discharge with outpatient follow-up as needed.  Return precautions discussed    ____________________________________________   FINAL CLINICAL IMPRESSION(S) / ED DIAGNOSES  Final diagnoses:  Acute gastritis without hemorrhage, unspecified gastritis type        Note:  This document was prepared using Dragon voice recognition software and may include unintentional dictation errors.   Lavonia Drafts, MD 02/19/19 [phone]

## 2019-02-24 ENCOUNTER — Encounter: Payer: PPO | Admitting: Physical Therapy

## 2019-02-25 ENCOUNTER — Other Ambulatory Visit: Payer: Self-pay

## 2019-02-25 ENCOUNTER — Ambulatory Visit: Payer: PPO | Admitting: Physical Therapy

## 2019-02-25 DIAGNOSIS — M6281 Muscle weakness (generalized): Secondary | ICD-10-CM

## 2019-02-25 DIAGNOSIS — R278 Other lack of coordination: Secondary | ICD-10-CM

## 2019-02-25 DIAGNOSIS — G4700 Insomnia, unspecified: Secondary | ICD-10-CM | POA: Diagnosis not present

## 2019-02-25 DIAGNOSIS — J309 Allergic rhinitis, unspecified: Secondary | ICD-10-CM | POA: Diagnosis not present

## 2019-02-25 DIAGNOSIS — K5900 Constipation, unspecified: Secondary | ICD-10-CM | POA: Diagnosis not present

## 2019-02-25 DIAGNOSIS — M2170 Unequal limb length (acquired), unspecified site: Secondary | ICD-10-CM

## 2019-02-25 DIAGNOSIS — R2689 Other abnormalities of gait and mobility: Secondary | ICD-10-CM

## 2019-02-25 DIAGNOSIS — R42 Dizziness and giddiness: Secondary | ICD-10-CM | POA: Diagnosis not present

## 2019-02-25 NOTE — Patient Instructions (Signed)
Colon massage  Lower R to upper R under ribs To upper L to lower L   5 x   ___

## 2019-02-26 NOTE — Therapy (Signed)
[place] MAIN [facility] SERVICES [address] [place], [place], [address] Phone: [phone]   Fax:  [phone]  Physical Therapy Treatment  Patient Details  Name: Lori Wiggins MRN: [id number] Date of Birth: May 31, 1928 Referring Provider (PT): Vaillancourt   Encounter Date: 02/25/2019  PT End of Session - 02/25/19 1118    Visit Number  16    Number of Visits  20    Date for PT Re-Evaluation  03/04/19   PN 12/23/18   Authorization - Visit Number  16    Authorization - Number of Visits  20    PT Start Time  1110    PT Stop Time  1200    PT Time Calculation (min)  50 min    Activity Tolerance  No increased pain    Behavior During Therapy  [facility] for tasks assessed/performed       Past Medical History:  Diagnosis Date  . Arthritis   . Breast cancer ([place]) 2012   left  breast  . Equilibrium disorder   . GERD (gastroesophageal reflux disease)   . Hypertension   . Hypothyroidism   . Personal history of radiation therapy   . Sleep apnea    uses CPAP    Past Surgical History:  Procedure Laterality Date  . ABDOMINAL HYSTERECTOMY    . APPENDECTOMY    . BOTOX INJECTION N/A 09/22/2018   Procedure: Bladder BOTOX INJECTION;  Surgeon: Hollice Espy, MD;  Location: [facility] ORS;  Service: Urology;  Laterality: N/A;  General vs MAC for anesthesia  . BREAST BIOPSY Left 2012   Positive  . BREAST EXCISIONAL BIOPSY Right    at age 30's  . BREAST LUMPECTOMY Left 2012   F/U radiation   . BREAST LUMPECTOMY WITH AXILLARY LYMPH NODE BIOPSY Left 2012   with Radiation  . BREAST SURGERY Left 2012   Lumpectomy  . CARDIAC CATHETERIZATION     10 years ago  . CATARACT EXTRACTION W/ INTRAOCULAR LENS  IMPLANT, BILATERAL Bilateral   . COLONOSCOPY    . JOINT REPLACEMENT    . TONSILLECTOMY    . TOTAL KNEE ARTHROPLASTY Left 07/21/2012   Dr Mayer Camel  . TOTAL KNEE ARTHROPLASTY Left 07/21/2012   Procedure: TOTAL KNEE ARTHROPLASTY;  Surgeon: Kerin Salen, MD;   Location: [place];  Service: Orthopedics;  Laterality: Left;    There were no vitals filed for this visit.  Subjective Assessment - 02/25/19 1116    Subjective  Pt reported she has been constipated. Pt noticed blood in her stool. Pt has seen her PCP about the blood and constipation. He has ordered her colace.    Pertinent History  2 vaginal deliveries with episiotomies.  Abdominal hysterectomy, appendectomy, L TKA, 3-4 years ago, pt went to [place] and started having equilibrium problems. Pt underwent therapy and still have some equilibrium problems.    Patient Stated Goals  stop leaking         [facility] PT Assessment - 02/26/19 1420      Observation/Other Assessments-Edema    Edema  --   Ankles B ( R > L)      Coordination   Gross Motor Movements are Fluid and Coordinated  --   limited diaphragmatic excrusion ( post Tx: increased)      Palpation   Spinal mobility  increased hypomobility at T5-10 , posterior intercostals  B  ( decreased post Tx)     Palpation comment  firmer abdominal mm, less fascial restrictions  [place] [facility] PT Treatment/Exercise - 02/26/19 1422      Therapeutic Activites    Other Therapeutic Activities  phoned MD re: edema at ankles        Modalities   Modalities  Moist Heat      Moist Heat Therapy   Number Minutes Moist Heat  5 Minutes    Moist Heat Location  --   back      Manual Therapy   Manual therapy comments  STM/MWM intercostals , gentle Grade II PA at T5-10 ,                  PT Long Term Goals - 02/11/19 2230      PT LONG TERM GOAL #1   Title  Pt will demo IND with HEP    Time  10    Period  Weeks    Status  Achieved      PT LONG TERM GOAL #2   Title  Pt will demo deep core coordination and proper technique for level 1 and 2 exercises to promote intraabdominal pressure system for continence    Time  4    Period  Weeks    Status  Achieved      PT LONG TERM GOAL #3   Title  Pt will increase gait  speed from 0.16ms to > 0.8 m/s in order to make it to the bathroom without leakage    Baseline  10-20-18 (2nd visit) 0.744m (good carry over from last visit).  12-30-18 ( 1.02 m/s)    Time  10    Period  Weeks    Status  Achieved      PT LONG TERM GOAL #4   Title  Pt will demo decreased abdominal scar restrictions in order to optimize TrA co-activation with diahphragm and pelvic floor to gain continence    Baseline  10-20-18- decreased abdominal scar restrictions post manual treatment.    Time  4    Period  Weeks    Status  Achieved      PT LONG TERM GOAL #5   Title  Pt wil demo less gait deviations ( R trunk lean 2/2 lumbar scoliotic curve) with compliance wearing shoe lift in order to adjust for leg length difference and to walk with longer stride length    Baseline  10-20-18 Pt demonstrated increased upright posture, and reports compliance with heel lift.    Time  2    Period  Weeks    Status  Achieved      Additional Long Term Goals   Additional Long Term Goals  Yes      PT LONG TERM GOAL #6   Title  Pt will report decreasing use of pull up pad from 2 x day to < 1 x day in order to improve QOL    Time  10    Period  Weeks    Status  Achieved      PT LONG TERM GOAL #7   Title  Pt will demo no overuse of glut/ adductors with cue for pelvic floor contractions in order to minimize leakage    Time  4    Period  Weeks    Status  New            Plan - 02/26/19 1425    Clinical Impression Statement  Pt demo'd increased diaphragmatic excursion following manual Tx at thoracic segments to minimize hypomobility, optimize posterior intercostal excursion. Pt demo'd more upright posture and  increased lumbar lordosis/ pelvic tilt which will also help with pelvic floor mobility with continence and bowel elimination. Notified PCP re: pt's edema at B ankles ( R > L) which pt reported she she noticed the past few days.  Pt continues to benefit from skilled PT   Personal Factors and  Comorbidities  Age    Examination-Activity Limitations  Continence;Toileting    Stability/Clinical Decision Making  Evolving/Moderate complexity    Rehab Potential  Good    PT Frequency  1x / week    PT Duration  Other (comment)   10   PT Treatment/Interventions  Gait training;Therapeutic exercise;Balance training;Neuromuscular re-education;Therapeutic activities;Manual lymph drainage;Manual techniques;Scar mobilization;Moist Heat;Spinal Manipulations;Patient/family education;Passive range of motion;Stair training    Consulted and Agree with Plan of Care  Patient       Patient will benefit from skilled therapeutic intervention in order to improve the following deficits and impairments:  Abnormal gait, Hypomobility, Difficulty walking, Increased muscle spasms, Improper body mechanics, Decreased scar mobility, Decreased coordination, Decreased activity tolerance, Decreased strength, Postural dysfunction, Decreased endurance, Decreased balance  Visit Diagnosis: Other lack of coordination  Muscle weakness (generalized)  Unequal leg length  Other abnormalities of gait and mobility     Problem List Patient Active Problem List   Diagnosis Date Noted  . Ataxia 02/04/2017  . Sinus bradycardia 02/04/2017  . Dizziness 01/21/2017  . History of stroke 01/21/2017  . Constipation 08/04/2012  . Unspecified hypothyroidism 07/31/2012  . Obstructive sleep apnea 07/31/2012  . E. coli UTI (urinary tract infection), fluoroquinolone resistant 07/26/2012  . Essential hypertension, benign 07/26/2012  . Ileus, postoperative ([place]) 07/24/2012  . Hyponatremia 07/24/2012  . Arthritis of knee, left 07/21/2012      Jerl Mina, PT, DPT, E-RYT Pelvic Health Physical Therapist [email or web address] [phone]   02/26/2019, 2:25 PM  [place] MAIN [facility] SERVICES [address] [place], [place], [address] Phone: [phone]   Fax:   [phone]  Name: SAREENA ODEH MRN: [id number] Date of Birth: 1928-05-15

## 2019-02-27 DIAGNOSIS — G4700 Insomnia, unspecified: Secondary | ICD-10-CM | POA: Diagnosis not present

## 2019-02-27 DIAGNOSIS — R600 Localized edema: Secondary | ICD-10-CM | POA: Diagnosis not present

## 2019-02-27 DIAGNOSIS — I10 Essential (primary) hypertension: Secondary | ICD-10-CM | POA: Diagnosis not present

## 2019-02-27 DIAGNOSIS — R42 Dizziness and giddiness: Secondary | ICD-10-CM | POA: Diagnosis not present

## 2019-03-03 ENCOUNTER — Other Ambulatory Visit: Payer: Self-pay

## 2019-03-03 ENCOUNTER — Ambulatory Visit: Payer: PPO | Attending: Physician Assistant | Admitting: Physical Therapy

## 2019-03-03 DIAGNOSIS — R278 Other lack of coordination: Secondary | ICD-10-CM | POA: Diagnosis not present

## 2019-03-03 DIAGNOSIS — M6281 Muscle weakness (generalized): Secondary | ICD-10-CM | POA: Insufficient documentation

## 2019-03-03 DIAGNOSIS — R2689 Other abnormalities of gait and mobility: Secondary | ICD-10-CM | POA: Diagnosis not present

## 2019-03-03 DIAGNOSIS — M2170 Unequal limb length (acquired), unspecified site: Secondary | ICD-10-CM | POA: Insufficient documentation

## 2019-03-03 DIAGNOSIS — R293 Abnormal posture: Secondary | ICD-10-CM | POA: Insufficient documentation

## 2019-03-03 NOTE — Therapy (Addendum)
[place] MAIN [facility] SERVICES [address] [place], [place], [address] Phone: [phone]   Fax:  [phone]  Physical Therapy Treatment / Progress Note reporting from 12/23/18 to 03/03/19   Patient Details  Name: Lori Wiggins MRN: [id number] Date of Birth: 1928/05/22 Referring Provider (PT): Vaillancourt   Encounter Date: 03/03/2019  PT End of Session - 03/03/19 1607    Visit Number  17    Number of Visits    Date for PT Re-Evaluation 05/12/19   PN 03/03/19   Authorization - Visit Number  17    Authorization - Number of Visits  20    PT Start Time  1410    PT Stop Time  1505    PT Time Calculation (min)  55 min    Activity Tolerance  No increased pain    Behavior During Therapy  [facility] for tasks assessed/performed       Past Medical History:  Diagnosis Date  . Arthritis   . Breast cancer ([place]) 2012   left  breast  . Equilibrium disorder   . GERD (gastroesophageal reflux disease)   . Hypertension   . Hypothyroidism   . Personal history of radiation therapy   . Sleep apnea    uses CPAP    Past Surgical History:  Procedure Laterality Date  . ABDOMINAL HYSTERECTOMY    . APPENDECTOMY    . BOTOX INJECTION N/A 09/22/2018   Procedure: Bladder BOTOX INJECTION;  Surgeon: Hollice Espy, MD;  Location: [facility] ORS;  Service: Urology;  Laterality: N/A;  General vs MAC for anesthesia  . BREAST BIOPSY Left 2012   Positive  . BREAST EXCISIONAL BIOPSY Right    at age 68's  . BREAST LUMPECTOMY Left 2012   F/U radiation   . BREAST LUMPECTOMY WITH AXILLARY LYMPH NODE BIOPSY Left 2012   with Radiation  . BREAST SURGERY Left 2012   Lumpectomy  . CARDIAC CATHETERIZATION     10 years ago  . CATARACT EXTRACTION W/ INTRAOCULAR LENS  IMPLANT, BILATERAL Bilateral   . COLONOSCOPY    . JOINT REPLACEMENT    . TONSILLECTOMY    . TOTAL KNEE ARTHROPLASTY Left 07/21/2012   Dr Mayer Camel  . TOTAL KNEE ARTHROPLASTY Left 07/21/2012   Procedure: TOTAL KNEE  ARTHROPLASTY;  Surgeon: Kerin Salen, MD;  Location: [place];  Service: Orthopedics;  Laterality: Left;    There were no vitals filed for this visit.  Subjective Assessment - 03/03/19 1830    Subjective  Pt reports MD provided medication that helped to decrease ankle swelling. Pt's bowel movements improved as well. Pt reports she still has some burning with pelvic floor squeezing sometimes   Patient is accompained by:  Family member   daughter drove pt but sat in waiting room   Pertinent History  2 vaginal deliveries with episiotomies.  Abdominal hysterectomy, appendectomy, L TKA, 3-4 years ago, pt went to [place] and started having equilibrium problems. Pt underwent therapy and still have some equilibrium problems.    Patient Stated Goals  stop leaking         [facility] PT Assessment - 03/03/19 1656      Coordination   Gross Motor Movements are Fluid and Coordinated  --   minor cues for less adductor use with pelvic floor                Pelvic Floor Special Questions - 03/03/19 1655    External Perineal Exam  raised red bump (  size of pencil eraser at 5 o'clock positon not present.  posterior labia majora/minor with redness ( noted diaper damp and stool soiling, provided pt a new pad)     External Palpation  increased tightness at 2nd layer         [facility] Adult PT Treatment/Exercise - 03/03/19 1652      Therapeutic Activites    Other Therapeutic Activities  modified HEP to clarify not overdo pelvic floor mm       Neuro Re-ed    Neuro Re-ed Details   cued for chest row and shoulder abd/ER exercises       Manual Therapy   Manual therapy comments  STM/MWM thoracic interspinal mm  B to promote more thoracic extension    Soft tissue mobilization  externally at 2nd layer mm urethral compressae/ deep transverse perineal B                      PT Long Term Goals - 03/17/19 1832      PT LONG TERM GOAL #1   Title  Pt will demo IND with HEP    Time  10    Period   Weeks    Status  Achieved      PT LONG TERM GOAL #2   Title  Pt will demo deep core coordination and proper technique for level 1 and 2 exercises to promote intraabdominal pressure system for continence    Time  4    Period  Weeks    Status  Achieved      PT LONG TERM GOAL #3   Title  Pt will increase gait speed from 0.93ms to > 0.8 m/s in order to make it to the bathroom without leakage    Baseline  10-20-18 (2nd visit) 0.741m (good carry over from last visit).  12-30-18 ( 1.02 m/s)    Time  10    Period  Weeks    Status  Achieved      PT LONG TERM GOAL #4   Title  Pt will demo decreased abdominal scar restrictions in order to optimize TrA co-activation with diahphragm and pelvic floor to gain continence    Baseline  10-20-18- decreased abdominal scar restrictions post manual treatment.    Time  4    Period  Weeks    Status  Achieved      PT LONG TERM GOAL #5   Title  Pt wil demo less gait deviations ( R trunk lean 2/2 lumbar scoliotic curve) with compliance wearing shoe lift in order to adjust for leg length difference and to walk with longer stride length    Baseline  10-20-18 Pt demonstrated increased upright posture, and reports compliance with heel lift.    Time  2    Period  Weeks    Status  Achieved      Additional Long Term Goals   Additional Long Term Goals  Yes      PT LONG TERM GOAL #6   Title  Pt will report decreasing use of pull up pad from 2 x day to < 1 x day in order to improve QOL    Time  10    Period  Weeks    Status  Achieved      PT LONG TERM GOAL #7   Title  Pt will demo no overuse of glut/ adductors with cue for pelvic floor contractions in order to minimize leakage    Time  4  Period  Weeks    Status  New      PT LONG TERM GOAL #8   Title  Pt will report 100% confidence with going out in the community for >2 hours because she feels confident she will be able to make it to the toilet without leakage    Time  10    Period  Weeks    Status   New    Target Date  05/13/19         Plan - 03/03/19 1608    Clinical Impression Statement Pt has achieved 8/10 goals and is progressing well with remaining goals. Pt reports she is making it to the bathroom without leakage and is able to go out to eat.  Pt demo'd improved posture, less thoracic kyphosis, less posterior tilt of pelvis, and more deep core coordination and strength. Pt is IND with pelvic floor mm contractions but last week,  Withheld pt  from pelvic floor squeezes due to pt's constipation and UTI.  Pt reports this week bowel movements have improved.  Upon assessment,  pt demo'd increased pelvic floor mm tightness at the 2nd layer which is likely related to her c/o burning with pelvic floor squeezes. Upon discussing what and how she was performing the other HEP exercises, pt was adding pelvic floor contractions with them which is likely leading to pt 's pelvic floor overactivity. Pt was explained to not engage pelvic floor squeezes with other exercises and to not over do kegel exercises to minimize the burning sensation. Pt voiced understanding. A new chart was provided to pt to encourage clarity and compliance.   Pt tolerated external manual Tx which helped to minimize urethra compressor and deep transverse perineal mm. Pt demo'd improved pelvic floor lengthening post Tx. Manual Tx was applied to increase thoracic extension and pt was progressed to more scapular stabilization exercises to improve posture.   Pt continues to benefit from skilled PT and anticipate pt will achieve remaining goals.    Personal Factors and Comorbidities  Age    Examination-Activity Limitations  Continence;Toileting    Stability/Clinical Decision Making  Evolving/Moderate complexity    Rehab Potential  Good    PT Frequency  1x / week    PT Duration  Other (comment)   10   PT Treatment/Interventions  Gait training;Therapeutic exercise;Balance training;Neuromuscular re-education;Therapeutic  activities;Manual lymph drainage;Manual techniques;Scar mobilization;Moist Heat;Spinal Manipulations;Patient/family education;Passive range of motion;Stair training    Consulted and Agree with Plan of Care  Patient       Patient will benefit from skilled therapeutic intervention in order to improve the following deficits and impairments:  Abnormal gait, Hypomobility, Difficulty walking, Increased muscle spasms, Improper body mechanics, Decreased scar mobility, Decreased coordination, Decreased activity tolerance, Decreased strength, Postural dysfunction, Decreased endurance, Decreased balance  Visit Diagnosis: Other lack of coordination  Muscle weakness (generalized)  Unequal leg length  Other abnormalities of gait and mobility     Problem List Patient Active Problem List   Diagnosis Date Noted  . Ataxia 02/04/2017  . Sinus bradycardia 02/04/2017  . Dizziness 01/21/2017  . History of stroke 01/21/2017  . Constipation 08/04/2012  . Unspecified hypothyroidism 07/31/2012  . Obstructive sleep apnea 07/31/2012  . E. coli UTI (urinary tract infection), fluoroquinolone resistant 07/26/2012  . Essential hypertension, benign 07/26/2012  . Ileus, postoperative ([place]) 07/24/2012  . Hyponatremia 07/24/2012  . Arthritis of knee, left 07/21/2012    Jerl Mina ,PT, DPT, E-RYT  03/03/2019, 6:31 PM  [place]  [place], [place], [address] Phone: [phone]   Fax:  [phone]  Name: Lori Wiggins MRN: [id number] Date of Birth: [birth year]

## 2019-03-03 NOTE — Patient Instructions (Addendum)
   Exercise check off   2x day    Mon Tue Wed Thurs Fri  Sat Sun  Open book           PELVIC SQUEEZE  Lying on back   10  reps ONLY, do not over do              Inhale, do nothing   Exhale move one knee out 6 min   DO NOT DO PELVIC SQUEEZE               DO NOT DO PELVIC SQUEEZES WITH THESE OTHER EXERCISES 1 x day   Zig zag + squat down hallway 2 laps             Rowing with red band at the door knob  20 reps   Seated accordian   Red band at forearm, Elbows dont move   Inhale Do nothing   Exhale open hands  20reps

## 2019-03-04 DIAGNOSIS — H6121 Impacted cerumen, right ear: Secondary | ICD-10-CM | POA: Diagnosis not present

## 2019-03-04 DIAGNOSIS — J330 Polyp of nasal cavity: Secondary | ICD-10-CM | POA: Diagnosis not present

## 2019-03-09 DIAGNOSIS — H43813 Vitreous degeneration, bilateral: Secondary | ICD-10-CM | POA: Diagnosis not present

## 2019-03-17 ENCOUNTER — Other Ambulatory Visit: Payer: Self-pay

## 2019-03-17 ENCOUNTER — Ambulatory Visit: Payer: PPO | Admitting: Physical Therapy

## 2019-03-17 DIAGNOSIS — M6281 Muscle weakness (generalized): Secondary | ICD-10-CM

## 2019-03-17 DIAGNOSIS — R278 Other lack of coordination: Secondary | ICD-10-CM

## 2019-03-17 DIAGNOSIS — M2170 Unequal limb length (acquired), unspecified site: Secondary | ICD-10-CM

## 2019-03-17 DIAGNOSIS — R2689 Other abnormalities of gait and mobility: Secondary | ICD-10-CM

## 2019-03-17 NOTE — Therapy (Signed)
[place] MAIN [facility] SERVICES [address] [place], [place], [address] Phone: [phone]   Fax:  [phone]  Physical Therapy Treatment  Patient Details  Name: Lori Wiggins MRN: [id number] Date of Birth: [birth year] Referring Provider (PT): Vaillancourt   Encounter Date: 03/17/2019  PT End of Session - 03/17/19 1838    Visit Number  18    Date for PT Re-Evaluation  05/12/19   PN 12/23/18   PT Start Time  1010    PT Stop Time  1100    PT Time Calculation (min)  50 min    Activity Tolerance  No increased pain    Behavior During Therapy  [facility] for tasks assessed/performed       Past Medical History:  Diagnosis Date  . Arthritis   . Breast cancer ([place]) 2012   left  breast  . Equilibrium disorder   . GERD (gastroesophageal reflux disease)   . Hypertension   . Hypothyroidism   . Personal history of radiation therapy   . Sleep apnea    uses CPAP    Past Surgical History:  Procedure Laterality Date  . ABDOMINAL HYSTERECTOMY    . APPENDECTOMY    . BOTOX INJECTION N/A 09/22/2018   Procedure: Bladder BOTOX INJECTION;  Surgeon: Hollice Espy, MD;  Location: [facility] ORS;  Service: Urology;  Laterality: N/A;  General vs MAC for anesthesia  . BREAST BIOPSY Left 2012   Positive  . BREAST EXCISIONAL BIOPSY Right    at age 15's  . BREAST LUMPECTOMY Left 2012   F/U radiation   . BREAST LUMPECTOMY WITH AXILLARY LYMPH NODE BIOPSY Left 2012   with Radiation  . BREAST SURGERY Left 2012   Lumpectomy  . CARDIAC CATHETERIZATION     10 years ago  . CATARACT EXTRACTION W/ INTRAOCULAR LENS  IMPLANT, BILATERAL Bilateral   . COLONOSCOPY    . JOINT REPLACEMENT    . TONSILLECTOMY    . TOTAL KNEE ARTHROPLASTY Left 07/21/2012   Dr Mayer Camel  . TOTAL KNEE ARTHROPLASTY Left 07/21/2012   Procedure: TOTAL KNEE ARTHROPLASTY;  Surgeon: Kerin Salen, MD;  Location: [place];  Service: Orthopedics;  Laterality: Left;    There were no vitals filed for this  visit.  Subjective Assessment - 03/17/19 1021    Subjective  Pt has been doing her exercises. Pt reports no burning with urination. Pt has seen her MD about the swelling in her legs and is taking water pill which has helped with the swelling.  Pt is taking a stool softener and having daily bowel movements again.    Patient is accompained by:  Family member   daughter drove pt but sat in waiting room   Pertinent History  2 vaginal deliveries with episiotomies.  Abdominal hysterectomy, appendectomy, L TKA, 3-4 years ago, pt went to [place] and started having equilibrium problems. Pt underwent therapy and still have some equilibrium problems.    Patient Stated Goals  stop leaking         [facility] PT Assessment - 03/17/19 1840      Observation/Other Assessments   Observations  less thoracic kyphosis      Palpation   Palpation comment   medial scapula B, intercostal mm tightness       Ambulation/Gait   Gait Comments  concave lumbar R                  [facility] Adult PT Treatment/Exercise - 03/17/19 1840  Therapeutic Activites    Other Therapeutic Activities  reviewed HEP       Neuro Re-ed    Neuro Re-ed Details   --      Manual Therapy   Manual therapy comments  STM/MWM intercostals lower ribs B, STM/MWM medial scapula B      Soft tissue mobilization  --                  PT Long Term Goals - 03/17/19 1832      PT LONG TERM GOAL #1   Title  Pt will demo IND with HEP    Time  10    Period  Weeks    Status  Achieved      PT LONG TERM GOAL #2   Title  Pt will demo deep core coordination and proper technique for level 1 and 2 exercises to promote intraabdominal pressure system for continence    Time  4    Period  Weeks    Status  Achieved      PT LONG TERM GOAL #3   Title  Pt will increase gait speed from 0.47ms to > 0.8 m/s in order to make it to the bathroom without leakage    Baseline  10-20-18 (2nd visit) 0.723m (good carry over from last visit).   12-30-18 ( 1.02 m/s)    Time  10    Period  Weeks    Status  Achieved      PT LONG TERM GOAL #4   Title  Pt will demo decreased abdominal scar restrictions in order to optimize TrA co-activation with diahphragm and pelvic floor to gain continence    Baseline  10-20-18- decreased abdominal scar restrictions post manual treatment.    Time  4    Period  Weeks    Status  Achieved      PT LONG TERM GOAL #5   Title  Pt wil demo less gait deviations ( R trunk lean 2/2 lumbar scoliotic curve) with compliance wearing shoe lift in order to adjust for leg length difference and to walk with longer stride length    Baseline  10-20-18 Pt demonstrated increased upright posture, and reports compliance with heel lift.    Time  2    Period  Weeks    Status  Achieved      Additional Long Term Goals   Additional Long Term Goals  Yes      PT LONG TERM GOAL #6   Title  Pt will report decreasing use of pull up pad from 2 x day to < 1 x day in order to improve QOL    Time  10    Period  Weeks    Status  Achieved      PT LONG TERM GOAL #7   Title  Pt will demo no overuse of glut/ adductors with cue for pelvic floor contractions in order to minimize leakage    Time  4    Period  Weeks    Status  New      PT LONG TERM GOAL #8   Title  Pt will report 100% confidence with going out in the community for >2 hours because she feels confident she will be able to make it to the toilet without leakage    Time  10    Period  Weeks    Status  New    Target Date  05/13/19  Plan - 03/17/19 1839    Clinical Impression Statement  Pt's UTI and constipation Sx have resolved which deems pt ready to return to pelvic floor contractions but pt was educated to not over do the pelvic floor contractions to minimize risks for UTI and constipation.  Pt required manual Tx today to continue increasing thoracic extension/ decreasing forward head. Plan to address scoliotic curve on R concave with strengthening  exercise. Pt continues to benefit from skilled PT    Personal Factors and Comorbidities  Age    Examination-Activity Limitations  Continence;Toileting    Stability/Clinical Decision Making  Evolving/Moderate complexity    Rehab Potential  Good    PT Frequency  1x / week    PT Duration  Other (comment)   10   PT Treatment/Interventions  Gait training;Therapeutic exercise;Balance training;Neuromuscular re-education;Therapeutic activities;Manual lymph drainage;Manual techniques;Scar mobilization;Moist Heat;Spinal Manipulations;Patient/family education;Passive range of motion;Stair training    Consulted and Agree with Plan of Care  Patient       Patient will benefit from skilled therapeutic intervention in order to improve the following deficits and impairments:  Abnormal gait, Hypomobility, Difficulty walking, Increased muscle spasms, Improper body mechanics, Decreased scar mobility, Decreased coordination, Decreased activity tolerance, Decreased strength, Postural dysfunction, Decreased endurance, Decreased balance  Visit Diagnosis: Other lack of coordination  Muscle weakness (generalized)  Unequal leg length  Other abnormalities of gait and mobility     Problem List Patient Active Problem List   Diagnosis Date Noted  . Ataxia 02/04/2017  . Sinus bradycardia 02/04/2017  . Dizziness 01/21/2017  . History of stroke 01/21/2017  . Constipation 08/04/2012  . Unspecified hypothyroidism 07/31/2012  . Obstructive sleep apnea 07/31/2012  . E. coli UTI (urinary tract infection), fluoroquinolone resistant 07/26/2012  . Essential hypertension, benign 07/26/2012  . Ileus, postoperative ([place]) 07/24/2012  . Hyponatremia 07/24/2012  . Arthritis of knee, left 07/21/2012    Jerl Mina ,PT, DPT, E-RYT  03/17/2019, 6:44 PM  [place] MAIN [facility] SERVICES [address] [place], [place], [address] Phone: [phone]   Fax:  [phone]  Name:  Lori Wiggins MRN: [id number] Date of Birth: 1928-04-11

## 2019-03-17 NOTE — Addendum Note (Signed)
Addended by: Jerl Mina on: 03/17/2019 06:38 PM   Modules accepted: Orders

## 2019-03-31 ENCOUNTER — Other Ambulatory Visit: Payer: Self-pay

## 2019-03-31 ENCOUNTER — Ambulatory Visit: Payer: PPO | Admitting: Physical Therapy

## 2019-03-31 DIAGNOSIS — R2689 Other abnormalities of gait and mobility: Secondary | ICD-10-CM

## 2019-03-31 DIAGNOSIS — M6281 Muscle weakness (generalized): Secondary | ICD-10-CM

## 2019-03-31 DIAGNOSIS — R278 Other lack of coordination: Secondary | ICD-10-CM

## 2019-03-31 DIAGNOSIS — M2170 Unequal limb length (acquired), unspecified site: Secondary | ICD-10-CM

## 2019-03-31 NOTE — Patient Instructions (Addendum)
Elbows back - chest lifts  ( cat cow)     __________    Red band at knob   the door - face away from door -mini squat like your picking up something -holding band , elbows by side  30 reps        _____________   Wall lean for scoliosis   R  Forearm slide against wall as you stand perpendicular to wall, band under feet, feet hip width apart,  Opposite elbow by side, , imagine holding pencil under armpit as you lean toward wall, lowering forearm against the wall and opposite hand pulls band without letting elbow move away from side body  And slide back up , straightening body    Make sure the upper trapezius muscle does not hike up to ear as band is being pulled as you lean towards wall    band  10 x 2

## 2019-04-01 NOTE — Therapy (Signed)
[place] MAIN [facility] SERVICES [address] [place], [place], [address] Phone: [phone]   Fax:  [phone]  Physical Therapy Treatment  Patient Details  Name: Lori Wiggins MRN: [id number] Date of Birth: 05-Jul-1928 Referring Provider (PT): Vaillancourt   Encounter Date: 03/31/2019  PT End of Session - 04/01/19 0801    Visit Number  19    Date for PT Re-Evaluation  05/12/19   PN 12/23/18   PT Start Time  1415    PT Stop Time  1510    PT Time Calculation (min)  55 min    Activity Tolerance  No increased pain    Behavior During Therapy  [facility] for tasks assessed/performed       Past Medical History:  Diagnosis Date  . Arthritis   . Breast cancer ([place]) 2012   left  breast  . Equilibrium disorder   . GERD (gastroesophageal reflux disease)   . Hypertension   . Hypothyroidism   . Personal history of radiation therapy   . Sleep apnea    uses CPAP    Past Surgical History:  Procedure Laterality Date  . ABDOMINAL HYSTERECTOMY    . APPENDECTOMY    . BOTOX INJECTION N/A 09/22/2018   Procedure: Bladder BOTOX INJECTION;  Surgeon: Hollice Espy, MD;  Location: [facility];  Service: Urology;  Laterality: N/A;  General vs MAC for anesthesia  . BREAST BIOPSY Left 2012   Positive  . BREAST EXCISIONAL BIOPSY Right    at age 87's  . BREAST LUMPECTOMY Left 2012   F/U radiation   . BREAST LUMPECTOMY WITH AXILLARY LYMPH NODE BIOPSY Left 2012   with Radiation  . BREAST SURGERY Left 2012   Lumpectomy  . CARDIAC CATHETERIZATION     10 years ago  . CATARACT EXTRACTION W/ INTRAOCULAR LENS  IMPLANT, BILATERAL Bilateral   . COLONOSCOPY    . JOINT REPLACEMENT    . TONSILLECTOMY    . TOTAL KNEE ARTHROPLASTY Left 07/21/2012   Dr Mayer Camel  . TOTAL KNEE ARTHROPLASTY Left 07/21/2012   Procedure: TOTAL KNEE ARTHROPLASTY;  Surgeon: Kerin Salen, MD;  Location: [place];  Service: Orthopedics;  Laterality: Left;    There were no vitals filed for this  visit.  Subjective Assessment - 03/31/19 1425    Subjective  Pt did the deep core and zig zag walking exercises consistently last week but this past week she has not do any of the exercises but kept up with deep core. Pt is able to make it to the bathroom most of the time but it is very insistent.    Patient is accompained by:  Family member   daughter drove pt but sat in waiting room   Pertinent History  2 vaginal deliveries with episiotomies.  Abdominal hysterectomy, appendectomy, L TKA, 3-4 years ago, pt went to [place] and started having equilibrium problems. Pt underwent therapy and still have some equilibrium problems.    Patient Stated Goals  stop leaking         [facility] PT Assessment - 04/01/19 0759      Posture/Postural Control   Posture Comments  R trunk lean       Palpation   Spinal mobility  increased tightness along T/L junction, scapular mm  B ( improved post Tx)       Ambulation/Gait   Gait Comments  shorter strides      Strength : hip abd 3+/5 B  [place] Adult PT Treatment/Exercise - 04/01/19 0759      Neuro Re-ed    Neuro Re-ed Details   progressed to R sided lengthening to adjust for scoliosis, progressed to hip ext/  chest rows coordination with bands, mini squat lat pull down . required moderate cues        Manual Therapy   Manual therapy comments  STM/MWM at mm noted in assessment                   PT Long Term Goals - 03/17/19 1832      PT LONG TERM GOAL #1   Title  Pt will demo IND with HEP    Time  10    Period  Weeks    Status  Achieved      PT LONG TERM GOAL #2   Title  Pt will demo deep core coordination and proper technique for level 1 and 2 exercises to promote intraabdominal pressure system for continence    Time  4    Period  Weeks    Status  Achieved      PT LONG TERM GOAL #3   Title  Pt will increase gait speed from 0.63ms to > 0.8 m/s in order to make it to the bathroom without leakage    Baseline  10-20-18  (2nd visit) 0.762m (good carry over from last visit).  12-30-18 ( 1.02 m/s)    Time  10    Period  Weeks    Status  Achieved      PT LONG TERM GOAL #4   Title  Pt will demo decreased abdominal scar restrictions in order to optimize TrA co-activation with diahphragm and pelvic floor to gain continence    Baseline  10-20-18- decreased abdominal scar restrictions post manual treatment.    Time  4    Period  Weeks    Status  Achieved      PT LONG TERM GOAL #5   Title  Pt wil demo less gait deviations ( R trunk lean 2/2 lumbar scoliotic curve) with compliance wearing shoe lift in order to adjust for leg length difference and to walk with longer stride length    Baseline  10-20-18 Pt demonstrated increased upright posture, and reports compliance with heel lift.    Time  2    Period  Weeks    Status  Achieved      Additional Long Term Goals   Additional Long Term Goals  Yes      PT LONG TERM GOAL #6   Title  Pt will report decreasing use of pull up pad from 2 x day to < 1 x day in order to improve QOL    Time  10    Period  Weeks    Status  Achieved      PT LONG TERM GOAL #7   Title  Pt will demo no overuse of glut/ adductors with cue for pelvic floor contractions in order to minimize leakage    Time  4    Period  Weeks    Status  New      PT LONG TERM GOAL #8   Title  Pt will report 100% confidence with going out in the community for >2 hours because she feels confident she will be able to make it to the toilet without leakage    Time  10    Period  Weeks    Status  New    [place]  Date  05/13/19            Plan - 04/01/19 0801    Clinical Impression Statement  Pt required an upgrade to thoracic ext/ hip ext exercises with resistance bands to improve stride length and thoracic kyphosis. Scoliosis specific stretch was provided to minimize R trunk lean and optimize R lumbar area. Hip abduction strength has increased. Plan to reassess goals for progress note at next session.  Posture improvements will continue to help with urinary Sx. Pt continues to benefit from skilled PT.    Personal Factors and Comorbidities  Age    Examination-Activity Limitations  Continence;Toileting    Stability/Clinical Decision Making  Evolving/Moderate complexity    Rehab Potential  Good    PT Frequency  1x / week    PT Duration  Other (comment)   10   PT Treatment/Interventions  Gait training;Therapeutic exercise;Balance training;Neuromuscular re-education;Therapeutic activities;Manual lymph drainage;Manual techniques;Scar mobilization;Moist Heat;Spinal Manipulations;Patient/family education;Passive range of motion;Stair training    Consulted and Agree with Plan of Care  Patient       Patient will benefit from skilled therapeutic intervention in order to improve the following deficits and impairments:  Abnormal gait, Hypomobility, Difficulty walking, Increased muscle spasms, Improper body mechanics, Decreased scar mobility, Decreased coordination, Decreased activity tolerance, Decreased strength, Postural dysfunction, Decreased endurance, Decreased balance  Visit Diagnosis: Other lack of coordination  Muscle weakness (generalized)  Unequal leg length  Other abnormalities of gait and mobility     Problem List Patient Active Problem List   Diagnosis Date Noted  . Ataxia 02/04/2017  . Sinus bradycardia 02/04/2017  . Dizziness 01/21/2017  . History of stroke 01/21/2017  . Constipation 08/04/2012  . Unspecified hypothyroidism 07/31/2012  . Obstructive sleep apnea 07/31/2012  . E. coli UTI (urinary tract infection), fluoroquinolone resistant 07/26/2012  . Essential hypertension, benign 07/26/2012  . Ileus, postoperative ([place]) 07/24/2012  . Hyponatremia 07/24/2012  . Arthritis of knee, left 07/21/2012    Jerl Mina ,PT, DPT, E-RYT  04/01/2019, 8:02 AM  [place] MAIN [facility] SERVICES [address] [place], [place],  [address] Phone: [phone]   Fax:  [phone]  Name: NYEMA HACHEY MRN: [id number] Date of Birth: Oct 19, 1928

## 2019-04-09 ENCOUNTER — Ambulatory Visit: Payer: PPO | Admitting: Physical Therapy

## 2019-04-14 DIAGNOSIS — G4733 Obstructive sleep apnea (adult) (pediatric): Secondary | ICD-10-CM | POA: Diagnosis not present

## 2019-04-16 ENCOUNTER — Encounter: Payer: PPO | Admitting: Physical Therapy

## 2019-04-23 DIAGNOSIS — E7800 Pure hypercholesterolemia, unspecified: Secondary | ICD-10-CM | POA: Diagnosis not present

## 2019-04-23 DIAGNOSIS — I10 Essential (primary) hypertension: Secondary | ICD-10-CM | POA: Diagnosis not present

## 2019-04-23 DIAGNOSIS — R42 Dizziness and giddiness: Secondary | ICD-10-CM | POA: Diagnosis not present

## 2019-04-23 DIAGNOSIS — M810 Age-related osteoporosis without current pathological fracture: Secondary | ICD-10-CM | POA: Diagnosis not present

## 2019-04-23 DIAGNOSIS — N390 Urinary tract infection, site not specified: Secondary | ICD-10-CM | POA: Diagnosis not present

## 2019-04-23 DIAGNOSIS — E119 Type 2 diabetes mellitus without complications: Secondary | ICD-10-CM | POA: Diagnosis not present

## 2019-04-23 DIAGNOSIS — G4700 Insomnia, unspecified: Secondary | ICD-10-CM | POA: Diagnosis not present

## 2019-04-23 DIAGNOSIS — J309 Allergic rhinitis, unspecified: Secondary | ICD-10-CM | POA: Diagnosis not present

## 2019-04-27 ENCOUNTER — Other Ambulatory Visit: Payer: Self-pay

## 2019-04-27 ENCOUNTER — Ambulatory Visit: Payer: PPO | Attending: Physician Assistant | Admitting: Physical Therapy

## 2019-04-27 DIAGNOSIS — M6281 Muscle weakness (generalized): Secondary | ICD-10-CM | POA: Diagnosis not present

## 2019-04-27 DIAGNOSIS — R278 Other lack of coordination: Secondary | ICD-10-CM

## 2019-04-27 DIAGNOSIS — R2689 Other abnormalities of gait and mobility: Secondary | ICD-10-CM | POA: Diagnosis not present

## 2019-04-27 DIAGNOSIS — M2170 Unequal limb length (acquired), unspecified site: Secondary | ICD-10-CM | POA: Insufficient documentation

## 2019-04-27 NOTE — Therapy (Signed)
[place] MAIN [facility] SERVICES [address] [place], [place], [address] Phone: [phone]   Fax:  [phone]  Physical Therapy Treatment / Discharge Summary   Patient Details  Name: Lori Wiggins MRN: [id number] Date of Birth: 24-May-1928 Referring Provider (PT): Vaillancourt   Encounter Date: 04/27/2019  PT End of Session - 04/27/19 1411    Visit Number  20    Date for PT Re-Evaluation  05/12/19   PN 12/23/18   PT Start Time  1404    PT Stop Time  1500    PT Time Calculation (min)  56 min    Activity Tolerance  No increased pain    Behavior During Therapy  [facility] for tasks assessed/performed       Past Medical History:  Diagnosis Date  . Arthritis   . Breast cancer ([place]) 2012   left  breast  . Equilibrium disorder   . GERD (gastroesophageal reflux disease)   . Hypertension   . Hypothyroidism   . Personal history of radiation therapy   . Sleep apnea    uses CPAP    Past Surgical History:  Procedure Laterality Date  . ABDOMINAL HYSTERECTOMY    . APPENDECTOMY    . BOTOX INJECTION N/A 09/22/2018   Procedure: Bladder BOTOX INJECTION;  Surgeon: Hollice Espy, MD;  Location: [facility];  Service: Urology;  Laterality: N/A;  General vs MAC for anesthesia  . BREAST BIOPSY Left 2012   Positive  . BREAST EXCISIONAL BIOPSY Right    at age 15's  . BREAST LUMPECTOMY Left 2012   F/U radiation   . BREAST LUMPECTOMY WITH AXILLARY LYMPH NODE BIOPSY Left 2012   with Radiation  . BREAST SURGERY Left 2012   Lumpectomy  . CARDIAC CATHETERIZATION     10 years ago  . CATARACT EXTRACTION W/ INTRAOCULAR LENS  IMPLANT, BILATERAL Bilateral   . COLONOSCOPY    . JOINT REPLACEMENT    . TONSILLECTOMY    . TOTAL KNEE ARTHROPLASTY Left 07/21/2012   Dr Mayer Camel  . TOTAL KNEE ARTHROPLASTY Left 07/21/2012   Procedure: TOTAL KNEE ARTHROPLASTY;  Surgeon: Kerin Salen, MD;  Location: [place];  Service: Orthopedics;  Laterality: Left;    There were no  vitals filed for this visit.  Subjective Assessment - 04/27/19 1416    Subjective  Pt reported she was able to hold it before she made it to the toilet. Pt felt she was leaning to the R while at church and she tried to straighten but she leaned again    Patient is accompained by:  Family member   daughter drove pt but sat in waiting room   Pertinent History  2 vaginal deliveries with episiotomies.  Abdominal hysterectomy, appendectomy, L TKA, 3-4 years ago, pt went to [place] and started having equilibrium problems. Pt underwent therapy and still have some equilibrium problems.    Patient Stated Goals  stop leaking         [facility] PT Assessment - 04/27/19 1412      Other:   Other/ Comments  scoliosis exercise w/ incorrect form       Posture/Postural Control   Posture Comments  R trunk lean                    [facility] Adult PT Treatment/Exercise - 04/27/19 1412      Neuro Re-ed    Neuro Re-ed Details   required cues for correct form for scoliosis HEP  PT Long Term Goals - 04/27/19 1413      PT LONG TERM GOAL #1   Title  Pt will demo IND with HEP    Time  10    Period  Weeks    Status  Achieved      PT LONG TERM GOAL #2   Title  Pt will demo deep core coordination and proper technique for level 1 and 2 exercises to promote intraabdominal pressure system for continence    Time  4    Period  Weeks    Status  Achieved      PT LONG TERM GOAL #3   Title  Pt will increase gait speed from 0.16ms to > 0.8 m/s in order to make it to the bathroom without leakage    Baseline  10-20-18 (2nd visit) 0.714m (good carry over from last visit).  12-30-18 ( 1.02 m/s)    Time  10    Period  Weeks    Status  Achieved      PT LONG TERM GOAL #4   Title  Pt will demo decreased abdominal scar restrictions in order to optimize TrA co-activation with diahphragm and pelvic floor to gain continence    Baseline  10-20-18- decreased abdominal scar restrictions post  manual treatment.    Time  4    Period  Weeks    Status  Achieved      PT LONG TERM GOAL #5   Title  Pt wil demo less gait deviations ( R trunk lean 2/2 lumbar scoliotic curve) with compliance wearing shoe lift in order to adjust for leg length difference and to walk with longer stride length    Baseline  10-20-18 Pt demonstrated increased upright posture, and reports compliance with heel lift.    Time  2    Period  Weeks    Status  Achieved      PT LONG TERM GOAL #6   Title  Pt will report decreasing use of pull up pad from 2 x day to < 1 x day in order to improve QOL    Time  10    Period  Weeks    Status  Achieved      PT LONG TERM GOAL #7   Title  Pt will demo no overuse of glut/ adductors with cue for pelvic floor contractions in order to minimize leakage    Time  4    Period  Weeks    Status  Achieved      PT LONG TERM GOAL #8   Title  Pt will report 100% confidence with going out in the community for >2 hours because she feels confident she will be able to make it to the toilet without leakage  ( 04/27/19 : 50%)    Time  10    Period  Weeks    Status  Partially Met            Plan - 04/27/19 1411    Clinical Impression Statement  Pt has achieved 7/8 goals and partially met her remaining goal. Pt has more continence and is more confident with leaving her house to go out to dinner and church.  Pt is able to make it to the bathroom with less leakage.  Pt's abdominal separation ( DRA), pelvic and spinal alignment and range of motion have improved with manual Tx and exercsies. Pt 's coordination with pelvic floor contraction have also improved.  Pt's scoliosis, thoracic kyphosis, and leg length  difference has been addressed.  Pt would benefit from further physical therapy to minimize worsening of scoliosis ( R trunk lean which impacts her equilibrium) and improve overall strength and balance. Pt would like to proceed with PT at her retriement community ( [facility]).   Requesting pt's referring provider to send over a PT order to this facility.  Pt is ready for d/c at this time.    Personal Factors and Comorbidities  Age    Examination-Activity Limitations  Continence;Toileting    Stability/Clinical Decision Making  Evolving/Moderate complexity    Rehab Potential  Good    PT Frequency  1x / week    PT Duration  Other (comment)   10   PT Treatment/Interventions  Gait training;Therapeutic exercise;Balance training;Neuromuscular re-education;Therapeutic activities;Manual lymph drainage;Manual techniques;Scar mobilization;Moist Heat;Spinal Manipulations;Patient/family education;Passive range of motion;Stair training    Consulted and Agree with Plan of Care  Patient       Patient will benefit from skilled therapeutic intervention in order to improve the following deficits and impairments:  Abnormal gait, Hypomobility, Difficulty walking, Increased muscle spasms, Improper body mechanics, Decreased scar mobility, Decreased coordination, Decreased activity tolerance, Decreased strength, Postural dysfunction, Decreased endurance, Decreased balance  Visit Diagnosis: Other lack of coordination  Muscle weakness (generalized)  Unequal leg length  Other abnormalities of gait and mobility     Problem List Patient Active Problem List   Diagnosis Date Noted  . Ataxia 02/04/2017  . Sinus bradycardia 02/04/2017  . Dizziness 01/21/2017  . History of stroke 01/21/2017  . Constipation 08/04/2012  . Unspecified hypothyroidism 07/31/2012  . Obstructive sleep apnea 07/31/2012  . E. coli UTI (urinary tract infection), fluoroquinolone resistant 07/26/2012  . Essential hypertension, benign 07/26/2012  . Ileus, postoperative ([place]) 07/24/2012  . Hyponatremia 07/24/2012  . Arthritis of knee, left 07/21/2012    Jerl Mina 04/27/2019, 2:55 PM  [place] MAIN [facility] SERVICES [address] [place], [place], [address] Phone:  [phone]   Fax:  [phone]  Name: Lori Wiggins MRN: [id number] Date of Birth: Nov 27, 1928

## 2019-04-30 DIAGNOSIS — N39498 Other specified urinary incontinence: Secondary | ICD-10-CM | POA: Diagnosis not present

## 2019-04-30 DIAGNOSIS — R2689 Other abnormalities of gait and mobility: Secondary | ICD-10-CM | POA: Diagnosis not present

## 2019-04-30 DIAGNOSIS — M6281 Muscle weakness (generalized): Secondary | ICD-10-CM | POA: Diagnosis not present

## 2019-05-04 ENCOUNTER — Ambulatory Visit: Payer: PPO | Admitting: Physical Therapy

## 2019-05-05 DIAGNOSIS — M6281 Muscle weakness (generalized): Secondary | ICD-10-CM | POA: Diagnosis not present

## 2019-05-05 DIAGNOSIS — N39498 Other specified urinary incontinence: Secondary | ICD-10-CM | POA: Diagnosis not present

## 2019-05-05 DIAGNOSIS — M9905 Segmental and somatic dysfunction of pelvic region: Secondary | ICD-10-CM | POA: Diagnosis not present

## 2019-05-05 DIAGNOSIS — R2689 Other abnormalities of gait and mobility: Secondary | ICD-10-CM | POA: Diagnosis not present

## 2019-05-11 ENCOUNTER — Ambulatory Visit: Payer: PPO | Admitting: Podiatry

## 2019-05-11 ENCOUNTER — Encounter: Payer: Self-pay | Admitting: Podiatry

## 2019-05-11 ENCOUNTER — Other Ambulatory Visit: Payer: Self-pay

## 2019-05-11 DIAGNOSIS — M79676 Pain in unspecified toe(s): Secondary | ICD-10-CM

## 2019-05-11 DIAGNOSIS — B351 Tinea unguium: Secondary | ICD-10-CM

## 2019-05-11 NOTE — Progress Notes (Signed)
She presents today chief complaint of painful elongated toenails bilaterally.  Is also complaining about her swelling.  States that she is not taking her fluid pill.  Objective: Pulses are palpable.  No erythema cellulitis drainage odor just considerable swelling.  Pitting edema anterior ankles.  Toenails are long thick yellow dystrophic-like mycotic.  Assessment: Pain in limb secondary to onychomycosis and edema bilateral.  Plan: Encouraged her to start back on her diuretic and debrided her toenails 1 through 5 bilaterally.

## 2019-05-12 DIAGNOSIS — M6281 Muscle weakness (generalized): Secondary | ICD-10-CM | POA: Diagnosis not present

## 2019-05-12 DIAGNOSIS — M9905 Segmental and somatic dysfunction of pelvic region: Secondary | ICD-10-CM | POA: Diagnosis not present

## 2019-05-12 DIAGNOSIS — R2689 Other abnormalities of gait and mobility: Secondary | ICD-10-CM | POA: Diagnosis not present

## 2019-05-12 DIAGNOSIS — N39498 Other specified urinary incontinence: Secondary | ICD-10-CM | POA: Diagnosis not present

## 2019-05-14 DIAGNOSIS — G4733 Obstructive sleep apnea (adult) (pediatric): Secondary | ICD-10-CM | POA: Diagnosis not present

## 2019-05-19 DIAGNOSIS — M6281 Muscle weakness (generalized): Secondary | ICD-10-CM | POA: Diagnosis not present

## 2019-05-19 DIAGNOSIS — N39498 Other specified urinary incontinence: Secondary | ICD-10-CM | POA: Diagnosis not present

## 2019-05-19 DIAGNOSIS — M9905 Segmental and somatic dysfunction of pelvic region: Secondary | ICD-10-CM | POA: Diagnosis not present

## 2019-05-19 DIAGNOSIS — R2689 Other abnormalities of gait and mobility: Secondary | ICD-10-CM | POA: Diagnosis not present

## 2019-05-20 DIAGNOSIS — G4733 Obstructive sleep apnea (adult) (pediatric): Secondary | ICD-10-CM | POA: Diagnosis not present

## 2019-05-20 DIAGNOSIS — J330 Polyp of nasal cavity: Secondary | ICD-10-CM | POA: Diagnosis not present

## 2019-05-20 DIAGNOSIS — H903 Sensorineural hearing loss, bilateral: Secondary | ICD-10-CM | POA: Diagnosis not present

## 2019-05-20 DIAGNOSIS — H6121 Impacted cerumen, right ear: Secondary | ICD-10-CM | POA: Diagnosis not present

## 2019-05-27 DIAGNOSIS — G4730 Sleep apnea, unspecified: Secondary | ICD-10-CM | POA: Diagnosis not present

## 2019-05-27 DIAGNOSIS — I10 Essential (primary) hypertension: Secondary | ICD-10-CM | POA: Diagnosis not present

## 2019-05-27 DIAGNOSIS — R001 Bradycardia, unspecified: Secondary | ICD-10-CM | POA: Diagnosis not present

## 2019-05-27 DIAGNOSIS — R42 Dizziness and giddiness: Secondary | ICD-10-CM | POA: Diagnosis not present

## 2019-05-27 DIAGNOSIS — Z8673 Personal history of transient ischemic attack (TIA), and cerebral infarction without residual deficits: Secondary | ICD-10-CM | POA: Diagnosis not present

## 2019-05-28 DIAGNOSIS — M6281 Muscle weakness (generalized): Secondary | ICD-10-CM | POA: Diagnosis not present

## 2019-05-28 DIAGNOSIS — M9905 Segmental and somatic dysfunction of pelvic region: Secondary | ICD-10-CM | POA: Diagnosis not present

## 2019-05-28 DIAGNOSIS — R2689 Other abnormalities of gait and mobility: Secondary | ICD-10-CM | POA: Diagnosis not present

## 2019-05-28 DIAGNOSIS — N39498 Other specified urinary incontinence: Secondary | ICD-10-CM | POA: Diagnosis not present

## 2019-06-02 DIAGNOSIS — M6281 Muscle weakness (generalized): Secondary | ICD-10-CM | POA: Diagnosis not present

## 2019-06-02 DIAGNOSIS — R2689 Other abnormalities of gait and mobility: Secondary | ICD-10-CM | POA: Diagnosis not present

## 2019-06-02 DIAGNOSIS — M9905 Segmental and somatic dysfunction of pelvic region: Secondary | ICD-10-CM | POA: Diagnosis not present

## 2019-06-02 DIAGNOSIS — N39498 Other specified urinary incontinence: Secondary | ICD-10-CM | POA: Diagnosis not present

## 2019-06-09 DIAGNOSIS — R42 Dizziness and giddiness: Secondary | ICD-10-CM | POA: Diagnosis not present

## 2019-06-09 DIAGNOSIS — E7800 Pure hypercholesterolemia, unspecified: Secondary | ICD-10-CM | POA: Diagnosis not present

## 2019-06-09 DIAGNOSIS — L0390 Cellulitis, unspecified: Secondary | ICD-10-CM | POA: Diagnosis not present

## 2019-06-09 DIAGNOSIS — I10 Essential (primary) hypertension: Secondary | ICD-10-CM | POA: Diagnosis not present

## 2019-06-11 DIAGNOSIS — N39498 Other specified urinary incontinence: Secondary | ICD-10-CM | POA: Diagnosis not present

## 2019-06-11 DIAGNOSIS — M6281 Muscle weakness (generalized): Secondary | ICD-10-CM | POA: Diagnosis not present

## 2019-06-11 DIAGNOSIS — R2689 Other abnormalities of gait and mobility: Secondary | ICD-10-CM | POA: Diagnosis not present

## 2019-06-11 DIAGNOSIS — M9905 Segmental and somatic dysfunction of pelvic region: Secondary | ICD-10-CM | POA: Diagnosis not present

## 2019-06-14 DIAGNOSIS — G4733 Obstructive sleep apnea (adult) (pediatric): Secondary | ICD-10-CM | POA: Diagnosis not present

## 2019-06-16 DIAGNOSIS — N39498 Other specified urinary incontinence: Secondary | ICD-10-CM | POA: Diagnosis not present

## 2019-06-16 DIAGNOSIS — M6281 Muscle weakness (generalized): Secondary | ICD-10-CM | POA: Diagnosis not present

## 2019-06-16 DIAGNOSIS — R2689 Other abnormalities of gait and mobility: Secondary | ICD-10-CM | POA: Diagnosis not present

## 2019-06-16 DIAGNOSIS — M9905 Segmental and somatic dysfunction of pelvic region: Secondary | ICD-10-CM | POA: Diagnosis not present

## 2019-06-17 DIAGNOSIS — G4700 Insomnia, unspecified: Secondary | ICD-10-CM | POA: Diagnosis not present

## 2019-06-17 DIAGNOSIS — R42 Dizziness and giddiness: Secondary | ICD-10-CM | POA: Diagnosis not present

## 2019-06-17 DIAGNOSIS — J309 Allergic rhinitis, unspecified: Secondary | ICD-10-CM | POA: Diagnosis not present

## 2019-06-17 DIAGNOSIS — K5900 Constipation, unspecified: Secondary | ICD-10-CM | POA: Diagnosis not present

## 2019-06-23 DIAGNOSIS — R2689 Other abnormalities of gait and mobility: Secondary | ICD-10-CM | POA: Diagnosis not present

## 2019-06-23 DIAGNOSIS — R42 Dizziness and giddiness: Secondary | ICD-10-CM | POA: Diagnosis not present

## 2019-06-23 DIAGNOSIS — N39498 Other specified urinary incontinence: Secondary | ICD-10-CM | POA: Diagnosis not present

## 2019-06-23 DIAGNOSIS — M6281 Muscle weakness (generalized): Secondary | ICD-10-CM | POA: Diagnosis not present

## 2019-06-23 DIAGNOSIS — H8113 Benign paroxysmal vertigo, bilateral: Secondary | ICD-10-CM | POA: Diagnosis not present

## 2019-06-23 DIAGNOSIS — M9905 Segmental and somatic dysfunction of pelvic region: Secondary | ICD-10-CM | POA: Diagnosis not present

## 2019-06-25 DIAGNOSIS — G4733 Obstructive sleep apnea (adult) (pediatric): Secondary | ICD-10-CM | POA: Diagnosis not present

## 2019-06-26 DIAGNOSIS — H8110 Benign paroxysmal vertigo, unspecified ear: Secondary | ICD-10-CM | POA: Diagnosis not present

## 2019-06-30 DIAGNOSIS — H8111 Benign paroxysmal vertigo, right ear: Secondary | ICD-10-CM | POA: Diagnosis not present

## 2019-07-01 DIAGNOSIS — M6281 Muscle weakness (generalized): Secondary | ICD-10-CM | POA: Diagnosis not present

## 2019-07-01 DIAGNOSIS — M9905 Segmental and somatic dysfunction of pelvic region: Secondary | ICD-10-CM | POA: Diagnosis not present

## 2019-07-01 DIAGNOSIS — R2689 Other abnormalities of gait and mobility: Secondary | ICD-10-CM | POA: Diagnosis not present

## 2019-07-01 DIAGNOSIS — N39498 Other specified urinary incontinence: Secondary | ICD-10-CM | POA: Diagnosis not present

## 2019-07-02 ENCOUNTER — Telehealth: Payer: Self-pay | Admitting: Physician Assistant

## 2019-07-02 NOTE — Telephone Encounter (Signed)
Patient recently completed pelvic floor PT with recommendation to continue PT at her retirement facility for overall strengthening and balance training. Recommend referral come from PCP as this is outside the scope of our practice.  Called patient to inquire if she would like to continue PT; LMOM to return my call.

## 2019-07-08 DIAGNOSIS — R42 Dizziness and giddiness: Secondary | ICD-10-CM | POA: Diagnosis not present

## 2019-07-22 DIAGNOSIS — R2689 Other abnormalities of gait and mobility: Secondary | ICD-10-CM | POA: Diagnosis not present

## 2019-07-22 DIAGNOSIS — M9905 Segmental and somatic dysfunction of pelvic region: Secondary | ICD-10-CM | POA: Diagnosis not present

## 2019-07-22 DIAGNOSIS — M6281 Muscle weakness (generalized): Secondary | ICD-10-CM | POA: Diagnosis not present

## 2019-07-22 DIAGNOSIS — N39498 Other specified urinary incontinence: Secondary | ICD-10-CM | POA: Diagnosis not present

## 2019-07-24 DIAGNOSIS — I10 Essential (primary) hypertension: Secondary | ICD-10-CM | POA: Diagnosis not present

## 2019-07-24 DIAGNOSIS — G4700 Insomnia, unspecified: Secondary | ICD-10-CM | POA: Diagnosis not present

## 2019-07-24 DIAGNOSIS — J309 Allergic rhinitis, unspecified: Secondary | ICD-10-CM | POA: Diagnosis not present

## 2019-07-24 DIAGNOSIS — R42 Dizziness and giddiness: Secondary | ICD-10-CM | POA: Diagnosis not present

## 2019-07-24 DIAGNOSIS — E7800 Pure hypercholesterolemia, unspecified: Secondary | ICD-10-CM | POA: Diagnosis not present

## 2019-07-29 DIAGNOSIS — M6281 Muscle weakness (generalized): Secondary | ICD-10-CM | POA: Diagnosis not present

## 2019-07-29 DIAGNOSIS — M9905 Segmental and somatic dysfunction of pelvic region: Secondary | ICD-10-CM | POA: Diagnosis not present

## 2019-07-29 DIAGNOSIS — N39498 Other specified urinary incontinence: Secondary | ICD-10-CM | POA: Diagnosis not present

## 2019-07-29 DIAGNOSIS — R2689 Other abnormalities of gait and mobility: Secondary | ICD-10-CM | POA: Diagnosis not present

## 2019-08-04 DIAGNOSIS — R42 Dizziness and giddiness: Secondary | ICD-10-CM | POA: Diagnosis not present

## 2019-08-04 DIAGNOSIS — N3281 Overactive bladder: Secondary | ICD-10-CM | POA: Diagnosis not present

## 2019-08-04 DIAGNOSIS — G4700 Insomnia, unspecified: Secondary | ICD-10-CM | POA: Diagnosis not present

## 2019-08-04 DIAGNOSIS — J309 Allergic rhinitis, unspecified: Secondary | ICD-10-CM | POA: Diagnosis not present

## 2019-08-05 DIAGNOSIS — M6281 Muscle weakness (generalized): Secondary | ICD-10-CM | POA: Diagnosis not present

## 2019-08-05 DIAGNOSIS — R2689 Other abnormalities of gait and mobility: Secondary | ICD-10-CM | POA: Diagnosis not present

## 2019-08-05 DIAGNOSIS — N39498 Other specified urinary incontinence: Secondary | ICD-10-CM | POA: Diagnosis not present

## 2019-08-05 DIAGNOSIS — M9905 Segmental and somatic dysfunction of pelvic region: Secondary | ICD-10-CM | POA: Diagnosis not present

## 2019-08-12 ENCOUNTER — Encounter: Payer: Self-pay | Admitting: Podiatry

## 2019-08-12 ENCOUNTER — Ambulatory Visit: Payer: PPO | Admitting: Podiatry

## 2019-08-12 ENCOUNTER — Other Ambulatory Visit: Payer: Self-pay

## 2019-08-12 DIAGNOSIS — B351 Tinea unguium: Secondary | ICD-10-CM

## 2019-08-12 DIAGNOSIS — M79676 Pain in unspecified toe(s): Secondary | ICD-10-CM

## 2019-08-12 NOTE — Progress Notes (Signed)
She presents today chief complaint of painful toenails.  Objective: Vital signs are stable she is alert and oriented x3.  Pulses are palpable.  There is no erythema edema cellulitis drainage or odor toenails are long thick yellow dystrophic and clinically mycotic.  Assessment: Pain in limb secondary to onychomycosis.  Plan: Debridement of toenails 1 through 5 bilateral.  Follow-up with her in 3 months

## 2019-08-13 DIAGNOSIS — R2689 Other abnormalities of gait and mobility: Secondary | ICD-10-CM | POA: Diagnosis not present

## 2019-08-13 DIAGNOSIS — M9905 Segmental and somatic dysfunction of pelvic region: Secondary | ICD-10-CM | POA: Diagnosis not present

## 2019-08-13 DIAGNOSIS — N39498 Other specified urinary incontinence: Secondary | ICD-10-CM | POA: Diagnosis not present

## 2019-08-13 DIAGNOSIS — M6281 Muscle weakness (generalized): Secondary | ICD-10-CM | POA: Diagnosis not present

## 2019-08-18 DIAGNOSIS — G4700 Insomnia, unspecified: Secondary | ICD-10-CM | POA: Diagnosis not present

## 2019-08-18 DIAGNOSIS — R42 Dizziness and giddiness: Secondary | ICD-10-CM | POA: Diagnosis not present

## 2019-08-18 DIAGNOSIS — J309 Allergic rhinitis, unspecified: Secondary | ICD-10-CM | POA: Diagnosis not present

## 2019-08-18 DIAGNOSIS — M810 Age-related osteoporosis without current pathological fracture: Secondary | ICD-10-CM | POA: Diagnosis not present

## 2019-08-20 DIAGNOSIS — N39498 Other specified urinary incontinence: Secondary | ICD-10-CM | POA: Diagnosis not present

## 2019-08-20 DIAGNOSIS — M9905 Segmental and somatic dysfunction of pelvic region: Secondary | ICD-10-CM | POA: Diagnosis not present

## 2019-08-20 DIAGNOSIS — R2689 Other abnormalities of gait and mobility: Secondary | ICD-10-CM | POA: Diagnosis not present

## 2019-08-20 DIAGNOSIS — M6281 Muscle weakness (generalized): Secondary | ICD-10-CM | POA: Diagnosis not present

## 2019-08-25 DIAGNOSIS — R42 Dizziness and giddiness: Secondary | ICD-10-CM | POA: Diagnosis not present

## 2019-08-25 DIAGNOSIS — J309 Allergic rhinitis, unspecified: Secondary | ICD-10-CM | POA: Diagnosis not present

## 2019-08-25 DIAGNOSIS — G4700 Insomnia, unspecified: Secondary | ICD-10-CM | POA: Diagnosis not present

## 2019-08-25 DIAGNOSIS — I10 Essential (primary) hypertension: Secondary | ICD-10-CM | POA: Diagnosis not present

## 2019-08-26 DIAGNOSIS — R2689 Other abnormalities of gait and mobility: Secondary | ICD-10-CM | POA: Diagnosis not present

## 2019-08-26 DIAGNOSIS — M6281 Muscle weakness (generalized): Secondary | ICD-10-CM | POA: Diagnosis not present

## 2019-08-26 DIAGNOSIS — N39498 Other specified urinary incontinence: Secondary | ICD-10-CM | POA: Diagnosis not present

## 2019-08-26 DIAGNOSIS — M9905 Segmental and somatic dysfunction of pelvic region: Secondary | ICD-10-CM | POA: Diagnosis not present

## 2019-09-02 DIAGNOSIS — R2689 Other abnormalities of gait and mobility: Secondary | ICD-10-CM | POA: Diagnosis not present

## 2019-09-02 DIAGNOSIS — M6281 Muscle weakness (generalized): Secondary | ICD-10-CM | POA: Diagnosis not present

## 2019-09-02 DIAGNOSIS — N39498 Other specified urinary incontinence: Secondary | ICD-10-CM | POA: Diagnosis not present

## 2019-09-02 DIAGNOSIS — M9905 Segmental and somatic dysfunction of pelvic region: Secondary | ICD-10-CM | POA: Diagnosis not present

## 2019-09-09 DIAGNOSIS — M9905 Segmental and somatic dysfunction of pelvic region: Secondary | ICD-10-CM | POA: Diagnosis not present

## 2019-09-09 DIAGNOSIS — M6281 Muscle weakness (generalized): Secondary | ICD-10-CM | POA: Diagnosis not present

## 2019-09-09 DIAGNOSIS — N39498 Other specified urinary incontinence: Secondary | ICD-10-CM | POA: Diagnosis not present

## 2019-09-09 DIAGNOSIS — R2689 Other abnormalities of gait and mobility: Secondary | ICD-10-CM | POA: Diagnosis not present

## 2019-09-11 DIAGNOSIS — Z20828 Contact with and (suspected) exposure to other viral communicable diseases: Secondary | ICD-10-CM | POA: Diagnosis not present

## 2019-09-16 DIAGNOSIS — M9905 Segmental and somatic dysfunction of pelvic region: Secondary | ICD-10-CM | POA: Diagnosis not present

## 2019-09-16 DIAGNOSIS — N39498 Other specified urinary incontinence: Secondary | ICD-10-CM | POA: Diagnosis not present

## 2019-09-16 DIAGNOSIS — M6281 Muscle weakness (generalized): Secondary | ICD-10-CM | POA: Diagnosis not present

## 2019-09-16 DIAGNOSIS — R2689 Other abnormalities of gait and mobility: Secondary | ICD-10-CM | POA: Diagnosis not present

## 2019-09-23 DIAGNOSIS — H1031 Unspecified acute conjunctivitis, right eye: Secondary | ICD-10-CM | POA: Diagnosis not present

## 2019-09-24 DIAGNOSIS — M9905 Segmental and somatic dysfunction of pelvic region: Secondary | ICD-10-CM | POA: Diagnosis not present

## 2019-09-24 DIAGNOSIS — N39498 Other specified urinary incontinence: Secondary | ICD-10-CM | POA: Diagnosis not present

## 2019-09-24 DIAGNOSIS — R490 Dysphonia: Secondary | ICD-10-CM | POA: Diagnosis not present

## 2019-09-24 DIAGNOSIS — K219 Gastro-esophageal reflux disease without esophagitis: Secondary | ICD-10-CM | POA: Diagnosis not present

## 2019-09-24 DIAGNOSIS — R2689 Other abnormalities of gait and mobility: Secondary | ICD-10-CM | POA: Diagnosis not present

## 2019-09-24 DIAGNOSIS — M6281 Muscle weakness (generalized): Secondary | ICD-10-CM | POA: Diagnosis not present

## 2019-09-30 DIAGNOSIS — M6281 Muscle weakness (generalized): Secondary | ICD-10-CM | POA: Diagnosis not present

## 2019-09-30 DIAGNOSIS — M9905 Segmental and somatic dysfunction of pelvic region: Secondary | ICD-10-CM | POA: Diagnosis not present

## 2019-09-30 DIAGNOSIS — N39498 Other specified urinary incontinence: Secondary | ICD-10-CM | POA: Diagnosis not present

## 2019-09-30 DIAGNOSIS — R2689 Other abnormalities of gait and mobility: Secondary | ICD-10-CM | POA: Diagnosis not present

## 2019-10-02 DIAGNOSIS — G4733 Obstructive sleep apnea (adult) (pediatric): Secondary | ICD-10-CM | POA: Diagnosis not present

## 2019-10-07 DIAGNOSIS — N39498 Other specified urinary incontinence: Secondary | ICD-10-CM | POA: Diagnosis not present

## 2019-10-07 DIAGNOSIS — M9905 Segmental and somatic dysfunction of pelvic region: Secondary | ICD-10-CM | POA: Diagnosis not present

## 2019-10-07 DIAGNOSIS — M6281 Muscle weakness (generalized): Secondary | ICD-10-CM | POA: Diagnosis not present

## 2019-10-07 DIAGNOSIS — R2689 Other abnormalities of gait and mobility: Secondary | ICD-10-CM | POA: Diagnosis not present

## 2019-10-14 DIAGNOSIS — M9905 Segmental and somatic dysfunction of pelvic region: Secondary | ICD-10-CM | POA: Diagnosis not present

## 2019-10-14 DIAGNOSIS — R2689 Other abnormalities of gait and mobility: Secondary | ICD-10-CM | POA: Diagnosis not present

## 2019-10-14 DIAGNOSIS — N39498 Other specified urinary incontinence: Secondary | ICD-10-CM | POA: Diagnosis not present

## 2019-10-14 DIAGNOSIS — M6281 Muscle weakness (generalized): Secondary | ICD-10-CM | POA: Diagnosis not present

## 2019-10-21 DIAGNOSIS — M9905 Segmental and somatic dysfunction of pelvic region: Secondary | ICD-10-CM | POA: Diagnosis not present

## 2019-10-21 DIAGNOSIS — R2689 Other abnormalities of gait and mobility: Secondary | ICD-10-CM | POA: Diagnosis not present

## 2019-10-21 DIAGNOSIS — N39498 Other specified urinary incontinence: Secondary | ICD-10-CM | POA: Diagnosis not present

## 2019-10-21 DIAGNOSIS — M6281 Muscle weakness (generalized): Secondary | ICD-10-CM | POA: Diagnosis not present

## 2019-10-27 DIAGNOSIS — J330 Polyp of nasal cavity: Secondary | ICD-10-CM | POA: Diagnosis not present

## 2019-10-27 DIAGNOSIS — G4733 Obstructive sleep apnea (adult) (pediatric): Secondary | ICD-10-CM | POA: Diagnosis not present

## 2019-10-27 DIAGNOSIS — H6123 Impacted cerumen, bilateral: Secondary | ICD-10-CM | POA: Diagnosis not present

## 2019-10-28 DIAGNOSIS — E782 Mixed hyperlipidemia: Secondary | ICD-10-CM | POA: Diagnosis not present

## 2019-10-28 DIAGNOSIS — M9905 Segmental and somatic dysfunction of pelvic region: Secondary | ICD-10-CM | POA: Diagnosis not present

## 2019-10-28 DIAGNOSIS — M6281 Muscle weakness (generalized): Secondary | ICD-10-CM | POA: Diagnosis not present

## 2019-10-28 DIAGNOSIS — R2689 Other abnormalities of gait and mobility: Secondary | ICD-10-CM | POA: Diagnosis not present

## 2019-10-28 DIAGNOSIS — N39498 Other specified urinary incontinence: Secondary | ICD-10-CM | POA: Diagnosis not present

## 2019-11-09 DIAGNOSIS — N390 Urinary tract infection, site not specified: Secondary | ICD-10-CM | POA: Diagnosis not present

## 2019-11-11 DIAGNOSIS — R2689 Other abnormalities of gait and mobility: Secondary | ICD-10-CM | POA: Diagnosis not present

## 2019-11-11 DIAGNOSIS — M9905 Segmental and somatic dysfunction of pelvic region: Secondary | ICD-10-CM | POA: Diagnosis not present

## 2019-11-11 DIAGNOSIS — M6281 Muscle weakness (generalized): Secondary | ICD-10-CM | POA: Diagnosis not present

## 2019-11-11 DIAGNOSIS — N39498 Other specified urinary incontinence: Secondary | ICD-10-CM | POA: Diagnosis not present

## 2019-11-14 DIAGNOSIS — G4733 Obstructive sleep apnea (adult) (pediatric): Secondary | ICD-10-CM | POA: Diagnosis not present

## 2019-11-18 ENCOUNTER — Encounter: Payer: Self-pay | Admitting: Podiatry

## 2019-11-18 ENCOUNTER — Other Ambulatory Visit: Payer: Self-pay

## 2019-11-18 ENCOUNTER — Ambulatory Visit: Payer: PPO | Admitting: Podiatry

## 2019-11-18 DIAGNOSIS — B351 Tinea unguium: Secondary | ICD-10-CM | POA: Diagnosis not present

## 2019-11-18 DIAGNOSIS — M79676 Pain in unspecified toe(s): Secondary | ICD-10-CM

## 2019-11-18 DIAGNOSIS — M6281 Muscle weakness (generalized): Secondary | ICD-10-CM | POA: Diagnosis not present

## 2019-11-18 DIAGNOSIS — N39498 Other specified urinary incontinence: Secondary | ICD-10-CM | POA: Diagnosis not present

## 2019-11-18 DIAGNOSIS — M9905 Segmental and somatic dysfunction of pelvic region: Secondary | ICD-10-CM | POA: Diagnosis not present

## 2019-11-18 DIAGNOSIS — R2689 Other abnormalities of gait and mobility: Secondary | ICD-10-CM | POA: Diagnosis not present

## 2019-11-18 NOTE — Progress Notes (Signed)
She presents today for a follow-up of her painful toenails bilaterally.  States that she is doing all right.  Objective: Vital signs are stable she alert oriented x3 pulses are palpable toenails are long thick yellow dystrophic-like mycotic sharply incurvated dystrophic.  Assessment: Pain in limb secondary to onychomycosis.  Plan: Debridement of toenails 1 through 5 bilateral.

## 2019-11-23 DIAGNOSIS — R42 Dizziness and giddiness: Secondary | ICD-10-CM | POA: Diagnosis not present

## 2019-11-23 DIAGNOSIS — G4733 Obstructive sleep apnea (adult) (pediatric): Secondary | ICD-10-CM | POA: Diagnosis not present

## 2019-11-25 DIAGNOSIS — M6281 Muscle weakness (generalized): Secondary | ICD-10-CM | POA: Diagnosis not present

## 2019-11-25 DIAGNOSIS — N39498 Other specified urinary incontinence: Secondary | ICD-10-CM | POA: Diagnosis not present

## 2019-11-25 DIAGNOSIS — R2689 Other abnormalities of gait and mobility: Secondary | ICD-10-CM | POA: Diagnosis not present

## 2019-11-25 DIAGNOSIS — M9905 Segmental and somatic dysfunction of pelvic region: Secondary | ICD-10-CM | POA: Diagnosis not present

## 2019-12-02 DIAGNOSIS — M9905 Segmental and somatic dysfunction of pelvic region: Secondary | ICD-10-CM | POA: Diagnosis not present

## 2019-12-02 DIAGNOSIS — R2689 Other abnormalities of gait and mobility: Secondary | ICD-10-CM | POA: Diagnosis not present

## 2019-12-02 DIAGNOSIS — N39498 Other specified urinary incontinence: Secondary | ICD-10-CM | POA: Diagnosis not present

## 2019-12-02 DIAGNOSIS — M6281 Muscle weakness (generalized): Secondary | ICD-10-CM | POA: Diagnosis not present

## 2019-12-10 DIAGNOSIS — M6281 Muscle weakness (generalized): Secondary | ICD-10-CM | POA: Diagnosis not present

## 2019-12-10 DIAGNOSIS — R42 Dizziness and giddiness: Secondary | ICD-10-CM | POA: Diagnosis not present

## 2019-12-10 DIAGNOSIS — R2689 Other abnormalities of gait and mobility: Secondary | ICD-10-CM | POA: Diagnosis not present

## 2019-12-10 DIAGNOSIS — R2681 Unsteadiness on feet: Secondary | ICD-10-CM | POA: Diagnosis not present

## 2019-12-14 DIAGNOSIS — G4733 Obstructive sleep apnea (adult) (pediatric): Secondary | ICD-10-CM | POA: Diagnosis not present

## 2019-12-23 DIAGNOSIS — M6281 Muscle weakness (generalized): Secondary | ICD-10-CM | POA: Diagnosis not present

## 2019-12-23 DIAGNOSIS — R2689 Other abnormalities of gait and mobility: Secondary | ICD-10-CM | POA: Diagnosis not present

## 2019-12-23 DIAGNOSIS — N39498 Other specified urinary incontinence: Secondary | ICD-10-CM | POA: Diagnosis not present

## 2019-12-23 DIAGNOSIS — M9905 Segmental and somatic dysfunction of pelvic region: Secondary | ICD-10-CM | POA: Diagnosis not present

## 2019-12-23 DIAGNOSIS — R42 Dizziness and giddiness: Secondary | ICD-10-CM | POA: Diagnosis not present

## 2019-12-23 DIAGNOSIS — G4733 Obstructive sleep apnea (adult) (pediatric): Secondary | ICD-10-CM | POA: Diagnosis not present

## 2019-12-23 DIAGNOSIS — R2681 Unsteadiness on feet: Secondary | ICD-10-CM | POA: Diagnosis not present

## 2019-12-30 DIAGNOSIS — M6281 Muscle weakness (generalized): Secondary | ICD-10-CM | POA: Diagnosis not present

## 2019-12-30 DIAGNOSIS — N39498 Other specified urinary incontinence: Secondary | ICD-10-CM | POA: Diagnosis not present

## 2019-12-30 DIAGNOSIS — R2681 Unsteadiness on feet: Secondary | ICD-10-CM | POA: Diagnosis not present

## 2019-12-30 DIAGNOSIS — M9905 Segmental and somatic dysfunction of pelvic region: Secondary | ICD-10-CM | POA: Diagnosis not present

## 2019-12-30 DIAGNOSIS — R42 Dizziness and giddiness: Secondary | ICD-10-CM | POA: Diagnosis not present

## 2019-12-30 DIAGNOSIS — R2689 Other abnormalities of gait and mobility: Secondary | ICD-10-CM | POA: Diagnosis not present

## 2020-01-05 DIAGNOSIS — H6121 Impacted cerumen, right ear: Secondary | ICD-10-CM | POA: Diagnosis not present

## 2020-01-05 DIAGNOSIS — J330 Polyp of nasal cavity: Secondary | ICD-10-CM | POA: Diagnosis not present

## 2020-01-05 DIAGNOSIS — G4733 Obstructive sleep apnea (adult) (pediatric): Secondary | ICD-10-CM | POA: Diagnosis not present

## 2020-01-06 DIAGNOSIS — N39498 Other specified urinary incontinence: Secondary | ICD-10-CM | POA: Diagnosis not present

## 2020-01-06 DIAGNOSIS — R2681 Unsteadiness on feet: Secondary | ICD-10-CM | POA: Diagnosis not present

## 2020-01-06 DIAGNOSIS — M6281 Muscle weakness (generalized): Secondary | ICD-10-CM | POA: Diagnosis not present

## 2020-01-06 DIAGNOSIS — M9905 Segmental and somatic dysfunction of pelvic region: Secondary | ICD-10-CM | POA: Diagnosis not present

## 2020-01-06 DIAGNOSIS — R42 Dizziness and giddiness: Secondary | ICD-10-CM | POA: Diagnosis not present

## 2020-01-06 DIAGNOSIS — R2689 Other abnormalities of gait and mobility: Secondary | ICD-10-CM | POA: Diagnosis not present

## 2020-01-13 DIAGNOSIS — R42 Dizziness and giddiness: Secondary | ICD-10-CM | POA: Diagnosis not present

## 2020-01-13 DIAGNOSIS — M9905 Segmental and somatic dysfunction of pelvic region: Secondary | ICD-10-CM | POA: Diagnosis not present

## 2020-01-13 DIAGNOSIS — R2689 Other abnormalities of gait and mobility: Secondary | ICD-10-CM | POA: Diagnosis not present

## 2020-01-13 DIAGNOSIS — N39498 Other specified urinary incontinence: Secondary | ICD-10-CM | POA: Diagnosis not present

## 2020-01-13 DIAGNOSIS — R2681 Unsteadiness on feet: Secondary | ICD-10-CM | POA: Diagnosis not present

## 2020-01-13 DIAGNOSIS — M6281 Muscle weakness (generalized): Secondary | ICD-10-CM | POA: Diagnosis not present

## 2020-01-14 DIAGNOSIS — G4733 Obstructive sleep apnea (adult) (pediatric): Secondary | ICD-10-CM | POA: Diagnosis not present

## 2020-01-20 DIAGNOSIS — R2681 Unsteadiness on feet: Secondary | ICD-10-CM | POA: Diagnosis not present

## 2020-01-20 DIAGNOSIS — M9905 Segmental and somatic dysfunction of pelvic region: Secondary | ICD-10-CM | POA: Diagnosis not present

## 2020-01-20 DIAGNOSIS — R2689 Other abnormalities of gait and mobility: Secondary | ICD-10-CM | POA: Diagnosis not present

## 2020-01-20 DIAGNOSIS — M6281 Muscle weakness (generalized): Secondary | ICD-10-CM | POA: Diagnosis not present

## 2020-01-20 DIAGNOSIS — N39498 Other specified urinary incontinence: Secondary | ICD-10-CM | POA: Diagnosis not present

## 2020-01-20 DIAGNOSIS — R42 Dizziness and giddiness: Secondary | ICD-10-CM | POA: Diagnosis not present

## 2020-01-22 DIAGNOSIS — G4733 Obstructive sleep apnea (adult) (pediatric): Secondary | ICD-10-CM | POA: Diagnosis not present

## 2020-01-25 DIAGNOSIS — R270 Ataxia, unspecified: Secondary | ICD-10-CM | POA: Diagnosis not present

## 2020-01-25 DIAGNOSIS — G4733 Obstructive sleep apnea (adult) (pediatric): Secondary | ICD-10-CM | POA: Diagnosis not present

## 2020-01-25 DIAGNOSIS — Z8673 Personal history of transient ischemic attack (TIA), and cerebral infarction without residual deficits: Secondary | ICD-10-CM | POA: Diagnosis not present

## 2020-01-25 DIAGNOSIS — I10 Essential (primary) hypertension: Secondary | ICD-10-CM | POA: Diagnosis not present

## 2020-01-25 DIAGNOSIS — Z0000 Encounter for general adult medical examination without abnormal findings: Secondary | ICD-10-CM | POA: Diagnosis not present

## 2020-01-25 DIAGNOSIS — Z9989 Dependence on other enabling machines and devices: Secondary | ICD-10-CM | POA: Diagnosis not present

## 2020-01-27 DIAGNOSIS — R42 Dizziness and giddiness: Secondary | ICD-10-CM | POA: Diagnosis not present

## 2020-01-27 DIAGNOSIS — R2689 Other abnormalities of gait and mobility: Secondary | ICD-10-CM | POA: Diagnosis not present

## 2020-01-27 DIAGNOSIS — N39498 Other specified urinary incontinence: Secondary | ICD-10-CM | POA: Diagnosis not present

## 2020-01-27 DIAGNOSIS — M9905 Segmental and somatic dysfunction of pelvic region: Secondary | ICD-10-CM | POA: Diagnosis not present

## 2020-01-27 DIAGNOSIS — R2681 Unsteadiness on feet: Secondary | ICD-10-CM | POA: Diagnosis not present

## 2020-01-27 DIAGNOSIS — M6281 Muscle weakness (generalized): Secondary | ICD-10-CM | POA: Diagnosis not present

## 2020-02-03 DIAGNOSIS — R2681 Unsteadiness on feet: Secondary | ICD-10-CM | POA: Diagnosis not present

## 2020-02-03 DIAGNOSIS — R42 Dizziness and giddiness: Secondary | ICD-10-CM | POA: Diagnosis not present

## 2020-02-03 DIAGNOSIS — N39498 Other specified urinary incontinence: Secondary | ICD-10-CM | POA: Diagnosis not present

## 2020-02-03 DIAGNOSIS — M9905 Segmental and somatic dysfunction of pelvic region: Secondary | ICD-10-CM | POA: Diagnosis not present

## 2020-02-03 DIAGNOSIS — R2689 Other abnormalities of gait and mobility: Secondary | ICD-10-CM | POA: Diagnosis not present

## 2020-02-03 DIAGNOSIS — M6281 Muscle weakness (generalized): Secondary | ICD-10-CM | POA: Diagnosis not present

## 2020-02-10 DIAGNOSIS — R2689 Other abnormalities of gait and mobility: Secondary | ICD-10-CM | POA: Diagnosis not present

## 2020-02-10 DIAGNOSIS — R2681 Unsteadiness on feet: Secondary | ICD-10-CM | POA: Diagnosis not present

## 2020-02-10 DIAGNOSIS — M6281 Muscle weakness (generalized): Secondary | ICD-10-CM | POA: Diagnosis not present

## 2020-02-10 DIAGNOSIS — R42 Dizziness and giddiness: Secondary | ICD-10-CM | POA: Diagnosis not present

## 2020-02-15 ENCOUNTER — Encounter: Payer: Self-pay | Admitting: Podiatry

## 2020-02-15 ENCOUNTER — Other Ambulatory Visit: Payer: Self-pay

## 2020-02-15 ENCOUNTER — Ambulatory Visit: Payer: PPO | Admitting: Podiatry

## 2020-02-15 DIAGNOSIS — M79676 Pain in unspecified toe(s): Secondary | ICD-10-CM

## 2020-02-15 DIAGNOSIS — B351 Tinea unguium: Secondary | ICD-10-CM

## 2020-02-15 NOTE — Progress Notes (Signed)
She presents today chief complaint of painful elongated toenails bilaterally.  Objective: Pulses are strongly palpable bilateral.  Toenails are long thick yellow dystrophic onychomycotic bilateral.  No open lesions or wounds.  Assessment: Pain in limb secondary to onychomycosis.  Plan: Debridement of toenails 1 through 5 bilateral.

## 2020-02-17 DIAGNOSIS — H903 Sensorineural hearing loss, bilateral: Secondary | ICD-10-CM | POA: Diagnosis not present

## 2020-02-17 DIAGNOSIS — H6121 Impacted cerumen, right ear: Secondary | ICD-10-CM | POA: Diagnosis not present

## 2020-02-17 DIAGNOSIS — J330 Polyp of nasal cavity: Secondary | ICD-10-CM | POA: Diagnosis not present

## 2020-02-17 DIAGNOSIS — G4733 Obstructive sleep apnea (adult) (pediatric): Secondary | ICD-10-CM | POA: Diagnosis not present

## 2020-02-24 DIAGNOSIS — R2689 Other abnormalities of gait and mobility: Secondary | ICD-10-CM | POA: Diagnosis not present

## 2020-02-24 DIAGNOSIS — N39498 Other specified urinary incontinence: Secondary | ICD-10-CM | POA: Diagnosis not present

## 2020-02-24 DIAGNOSIS — M9905 Segmental and somatic dysfunction of pelvic region: Secondary | ICD-10-CM | POA: Diagnosis not present

## 2020-02-24 DIAGNOSIS — R2681 Unsteadiness on feet: Secondary | ICD-10-CM | POA: Diagnosis not present

## 2020-02-24 DIAGNOSIS — R42 Dizziness and giddiness: Secondary | ICD-10-CM | POA: Diagnosis not present

## 2020-02-24 DIAGNOSIS — M6281 Muscle weakness (generalized): Secondary | ICD-10-CM | POA: Diagnosis not present

## 2020-03-02 DIAGNOSIS — R42 Dizziness and giddiness: Secondary | ICD-10-CM | POA: Diagnosis not present

## 2020-03-02 DIAGNOSIS — M9905 Segmental and somatic dysfunction of pelvic region: Secondary | ICD-10-CM | POA: Diagnosis not present

## 2020-03-02 DIAGNOSIS — R2689 Other abnormalities of gait and mobility: Secondary | ICD-10-CM | POA: Diagnosis not present

## 2020-03-02 DIAGNOSIS — N39498 Other specified urinary incontinence: Secondary | ICD-10-CM | POA: Diagnosis not present

## 2020-03-02 DIAGNOSIS — R2681 Unsteadiness on feet: Secondary | ICD-10-CM | POA: Diagnosis not present

## 2020-03-02 DIAGNOSIS — M6281 Muscle weakness (generalized): Secondary | ICD-10-CM | POA: Diagnosis not present

## 2020-03-09 DIAGNOSIS — R42 Dizziness and giddiness: Secondary | ICD-10-CM | POA: Diagnosis not present

## 2020-03-09 DIAGNOSIS — R2689 Other abnormalities of gait and mobility: Secondary | ICD-10-CM | POA: Diagnosis not present

## 2020-03-09 DIAGNOSIS — M6281 Muscle weakness (generalized): Secondary | ICD-10-CM | POA: Diagnosis not present

## 2020-03-09 DIAGNOSIS — R2681 Unsteadiness on feet: Secondary | ICD-10-CM | POA: Diagnosis not present

## 2020-03-13 DIAGNOSIS — G4733 Obstructive sleep apnea (adult) (pediatric): Secondary | ICD-10-CM | POA: Diagnosis not present

## 2020-03-16 DIAGNOSIS — H04221 Epiphora due to insufficient drainage, right lacrimal gland: Secondary | ICD-10-CM | POA: Diagnosis not present

## 2020-04-05 ENCOUNTER — Emergency Department: Payer: PPO

## 2020-04-05 ENCOUNTER — Emergency Department
Admission: EM | Admit: 2020-04-05 | Discharge: 2020-04-05 | Disposition: A | Discharge status: Home / Self Care | Payer: PPO | Attending: Student in an Organized Health Care Education/Training Program | Admitting: Student in an Organized Health Care Education/Training Program

## 2020-04-05 ENCOUNTER — Other Ambulatory Visit: Payer: Self-pay

## 2020-04-05 DIAGNOSIS — I10 Essential (primary) hypertension: Secondary | ICD-10-CM | POA: Insufficient documentation

## 2020-04-05 DIAGNOSIS — M549 Dorsalgia, unspecified: Secondary | ICD-10-CM | POA: Diagnosis not present

## 2020-04-05 DIAGNOSIS — Z853 Personal history of malignant neoplasm of breast: Secondary | ICD-10-CM | POA: Diagnosis not present

## 2020-04-05 DIAGNOSIS — Z7982 Long term (current) use of aspirin: Secondary | ICD-10-CM | POA: Diagnosis not present

## 2020-04-05 DIAGNOSIS — N939 Abnormal uterine and vaginal bleeding, unspecified: Secondary | ICD-10-CM | POA: Diagnosis not present

## 2020-04-05 DIAGNOSIS — Z96652 Presence of left artificial knee joint: Secondary | ICD-10-CM | POA: Diagnosis not present

## 2020-04-05 DIAGNOSIS — S299XXA Unspecified injury of thorax, initial encounter: Secondary | ICD-10-CM | POA: Diagnosis not present

## 2020-04-05 DIAGNOSIS — G9389 Other specified disorders of brain: Secondary | ICD-10-CM | POA: Diagnosis not present

## 2020-04-05 DIAGNOSIS — W01198A Fall on same level from slipping, tripping and stumbling with subsequent striking against other object, initial encounter: Secondary | ICD-10-CM | POA: Diagnosis not present

## 2020-04-05 DIAGNOSIS — G309 Alzheimer's disease, unspecified: Secondary | ICD-10-CM | POA: Insufficient documentation

## 2020-04-05 DIAGNOSIS — E039 Hypothyroidism, unspecified: Secondary | ICD-10-CM | POA: Diagnosis not present

## 2020-04-05 DIAGNOSIS — W19XXXA Unspecified fall, initial encounter: Secondary | ICD-10-CM

## 2020-04-05 DIAGNOSIS — Z79899 Other long term (current) drug therapy: Secondary | ICD-10-CM | POA: Insufficient documentation

## 2020-04-05 DIAGNOSIS — J3489 Other specified disorders of nose and nasal sinuses: Secondary | ICD-10-CM | POA: Diagnosis not present

## 2020-04-05 DIAGNOSIS — J984 Other disorders of lung: Secondary | ICD-10-CM | POA: Diagnosis not present

## 2020-04-05 LAB — BASIC METABOLIC PANEL
Anion gap: 9 (ref 5–15)
BUN: 16 mg/dL (ref 8–23)
CO2: 25 mmol/L (ref 22–32)
Calcium: 9.6 mg/dL (ref 8.9–10.3)
Chloride: 102 mmol/L (ref 98–111)
Creatinine, Ser: 0.74 mg/dL (ref 0.44–1.00)
GFR, Estimated: >60 mL/min (ref >60)
Glucose, Bld: 107 mg/dL — ABNORMAL HIGH (ref 70–99)
Potassium: 4.1 mmol/L (ref 3.5–5.1)
Sodium: 136 mmol/L (ref 135–145)

## 2020-04-05 LAB — URINALYSIS, COMPLETE (UACMP) WITH MICROSCOPIC
Bilirubin Urine: NEGATIVE
Glucose, UA: NEGATIVE mg/dL
Ketones, ur: NEGATIVE mg/dL
Nitrite: NEGATIVE
Protein, ur: NEGATIVE mg/dL
Specific Gravity, Urine: 1.003 — ABNORMAL LOW (ref 1.005–1.030)
pH: 9 — ABNORMAL HIGH (ref 5.0–8.0)

## 2020-04-05 LAB — CBC
HCT: 39.3 % (ref 36.0–46.0)
Hemoglobin: 12.9 g/dL (ref 12.0–15.0)
MCH: 28.4 pg (ref 26.0–34.0)
MCHC: 32.8 g/dL (ref 30.0–36.0)
MCV: 86.6 fL (ref 80.0–100.0)
Platelets: 170 10*3/uL (ref 150–400)
RBC: 4.54 MIL/uL (ref 3.87–5.11)
RDW: 14.5 % (ref 11.5–15.5)
WBC: 10.4 10*3/uL (ref 4.0–10.5)
nRBC: 0 % (ref 0.0–0.2)

## 2020-04-05 MED ORDER — CEPHALEXIN 500 MG PO CAPS: 500.0000 mg | ORAL_CAPSULE | Freq: Four times a day (QID) | ORAL | 0 refills | Status: AC

## 2020-04-05 MED ORDER — CEPHALEXIN 500 MG PO CAPS
500.0000 mg | ORAL_CAPSULE | Freq: Once | ORAL | Status: AC
  Administered 2020-04-05: 500 mg via ORAL
  Filled 2020-04-05: qty 1

## 2020-04-05 NOTE — ED Provider Notes (Signed)
  Patient received in signout from Dr. Quentin Cornwall pending urinalysis prior to expected discharge.  UA reviewed by me and concerning for acute cystitis considering moderate leukocytes.  We will send this for culture and empirically treat the patient with a course of Keflex to treat.  We will provide patient information to follow-up with OB/GYN.  Return precautions for the ED were discussed prior to discharge.   Vladimir Crofts, MD 04/05/20 2132

## 2020-04-05 NOTE — ED Triage Notes (Addendum)
Pt comes with c/o fall. Pt states she was carrying her coffee and dropped it. Pt states another person tried to help her clean it up and ended up slipping on the coffee and fell into the pt causing her to fall backwards.  Pt states back pain and then she noticed some vaginal bleeding. Pt denies any vaginal pain. Pt states some mid abdominal pain.  Pt states no blood thinners.

## 2020-04-05 NOTE — ED Notes (Signed)
Pt has been provided with discharge instructions. Pt denies any questions or concerns at this time. Pt verbalizes understanding for follow up care and d/c.  VSS.  Pt left department with all belongings.  

## 2020-04-05 NOTE — Discharge Instructions (Signed)
You are being discharged with a prescription for Keflex antibiotic to take 4 times daily for the next 7 days to treat a UTI/bladder infection.  Please follow-up with the OB/GYN to discuss possible prolapse of your urethra contributing to UTI.  Return to the ED with any worsening symptoms despite these medications.

## 2020-04-05 NOTE — ED Provider Notes (Signed)
[facility] Emergency Department Provider Note    Event Date/Time   First MD Initiated Contact with Patient 04/05/20 1902     (approximate)  I have reviewed the triage vital signs and the nursing notes.   HISTORY  Chief Complaint Fall    HPI Lori Wiggins is a 87 y.o. female with the below listed past medical history presents to the ER after a fall that occurred while she was at a line this evening at a meeting.  States that she was carrying coffee and tripped on a threshold.  States that she spilled the coffee but then as one of the bystanders was helping her clean it up she bumped into them and was knocked backwards hitting her back against the wall and striking her head.  Denies any LOC but states it happened very quick so she is not certain.  Denies any new numbness or tingling.  Primarily having discomfort in the between her shoulder blades.  Denies any neck pain.  No abdominal pain.  No hip or lower back pain.  States that when she got home she did notice some blood that she thinks is vaginal in source but this was only 1 time.  States she is not having additional vaginal bleeding.    Past Medical History:  Diagnosis Date  . Arthritis   . Breast cancer ([place]) 2012   left  breast  . Equilibrium disorder   . GERD (gastroesophageal reflux disease)   . Hypertension   . Hypothyroidism   . Personal history of radiation therapy   . Sleep apnea    uses CPAP   Family History  Problem Relation Age of Onset  . Alzheimer's disease Mother   . Heart attack Father   . Breast cancer Neg Hx    Past Surgical History:  Procedure Laterality Date  . ABDOMINAL HYSTERECTOMY    . APPENDECTOMY    . BOTOX INJECTION N/A 09/22/2018   Procedure: Bladder BOTOX INJECTION;  Surgeon: Hollice Espy, MD;  Location: [facility] ORS;  Service: Urology;  Laterality: N/A;  General vs MAC for anesthesia  . BREAST BIOPSY Left 2012   Positive  . BREAST EXCISIONAL BIOPSY Right     at age 62's  . BREAST LUMPECTOMY Left 2012   F/U radiation   . BREAST LUMPECTOMY WITH AXILLARY LYMPH NODE BIOPSY Left 2012   with Radiation  . BREAST SURGERY Left 2012   Lumpectomy  . CARDIAC CATHETERIZATION     10 years ago  . CATARACT EXTRACTION W/ INTRAOCULAR LENS  IMPLANT, BILATERAL Bilateral   . COLONOSCOPY    . JOINT REPLACEMENT    . TONSILLECTOMY    . TOTAL KNEE ARTHROPLASTY Left 07/21/2012   Dr Mayer Camel  . TOTAL KNEE ARTHROPLASTY Left 07/21/2012   Procedure: TOTAL KNEE ARTHROPLASTY;  Surgeon: Kerin Salen, MD;  Location: [place];  Service: Orthopedics;  Laterality: Left;   Patient Active Problem List   Diagnosis Date Noted  . Ataxia 02/04/2017  . Sinus bradycardia 02/04/2017  . Dizziness 01/21/2017  . History of stroke 01/21/2017  . Constipation 08/04/2012  . Unspecified hypothyroidism 07/31/2012  . Obstructive sleep apnea 07/31/2012  . E. coli UTI (urinary tract infection), fluoroquinolone resistant 07/26/2012  . Essential hypertension, benign 07/26/2012  . Ileus, postoperative ([place]) 07/24/2012  . Hyponatremia 07/24/2012  . Arthritis of knee, left 07/21/2012      Prior to Admission medications   Medication Sig Start Date End Date Taking? Authorizing Provider  amLODipine (NORVASC)  2.5 MG tablet Take 2.5 mg by mouth 2 (two) times daily.  05/22/16   [provider]  amoxicillin (AMOXIL) 500 MG capsule Take by mouth. 11/02/19   [provider]  Apoaequorin (PREVAGEN) 10 MG CAPS Take by mouth.    [provider]  aspirin EC 81 MG tablet Take 81 mg by mouth daily.    [provider]  azelastine (ASTELIN) 0.1 % nasal spray  10/28/19   [provider]  Calcium-Vitamin D-Vitamin K (VIACTIV CALCIUM PLUS D PO) Take 1 tablet by mouth daily.    [provider]  Cholecalciferol (VITAMIN D3 PO) Take 1 tablet by mouth daily.    [provider]  ciprofloxacin (CIPRO) 250 MG tablet Take 250 mg by mouth 2 (two) times daily.  02/10/19   [provider]  Coenzyme Q10 (COQ10 PO) Take 1 capsule by mouth daily.    [provider]  fluticasone Asencion Islam) 50 MCG/ACT nasal spray  11/06/18   [provider]  gentamicin ointment (GARAMYCIN) 0.1 % Apply 1 application topically 2 (two) times daily. Apply to bottom of nose for CPAP mask 01/09/16   [provider]  hydrochlorothiazide (MICROZIDE) 12.5 MG capsule Take 12.5 mg by mouth daily. 02/27/19   [provider]  LINZESS 72 MCG capsule Take 72 mcg by mouth every morning. 06/17/19   [provider]  losartan (COZAAR) 50 MG tablet Take 50 mg by mouth daily. 11/09/19   [provider]  MAGNESIUM-ZINC PO Take 1 tablet by mouth daily.    [provider]  meclizine (ANTIVERT) 25 MG tablet Take 25 mg by mouth 2 (two) times daily as needed. 07/24/19   [provider]  Menthol, Topical Analgesic, (BLUE-EMU MAXIMUM STRENGTH EX) Apply 1 application topically 2 (two) times daily as needed (pain).    [provider]  metoprolol succinate (TOPROL-XL) 25 MG 24 hr tablet Take 25 mg by mouth daily.  05/29/16   [provider]  Misc Natural Products (JOINT HEALTH PO) Take 1 capsule by mouth daily. Peak Mobility    [provider]  omeprazole (PRILOSEC) 20 MG capsule Take 20 mg by mouth daily. 02/19/19   [provider]  vitamin B-12 (CYANOCOBALAMIN) 100 MCG tablet Take 100 mcg by mouth daily.    [provider]    Allergies Sulfa antibiotics    Social History Social History   Tobacco Use  . Smoking status: Never Smoker  . Smokeless tobacco: Never Used  Vaping Use  . Vaping Use: Never used  Substance Use Topics  . Alcohol use: Yes    Alcohol/week: 7.0 standard drinks    Types: 7 Glasses of wine per week    Comment: social  . Drug use: No    Review of Systems Patient denies headaches, rhinorrhea, blurry vision, numbness, shortness of breath, chest pain, edema,  cough, abdominal pain, nausea, vomiting, diarrhea, dysuria, fevers, rashes or hallucinations unless otherwise stated above in HPI. ____________________________________________   PHYSICAL EXAM:  VITAL SIGNS: Vitals:   04/05/20 1844  BP: (!) 158/74  Pulse: 77  Resp: 18  Temp: 98.3 F (36.8 C)  SpO2: 100%    Constitutional: Alert and oriented.  Eyes: Conjunctivae are normal.  Head: Atraumatic. Nose: No congestion/rhinnorhea. Mouth/Throat: Mucous membranes are moist.   Neck: No stridor. Painless ROM.  Cardiovascular: Normal rate, regular rhythm. Grossly normal heart sounds.  Good peripheral circulation. Respiratory: Normal respiratory effort.  No retractions. Lungs CTAB. Gastrointestinal: Soft and nontender. No distention. No  abdominal bruits. No CVA tenderness. Genitourinary: Exam a small amount of bleeding and spotting.  On pelvic exam no evidence of vaginal bleeding does have what appears to be small probable prolapse urethra is a source of irritation. Musculoskeletal: No lower extremity tenderness nor edema.  No joint effusions. Neurologic:  Normal speech and language. No gross focal neurologic deficits are appreciated. No facial droop Skin:  Skin is warm, dry and intact. No rash noted. Psychiatric: Mood and affect are normal. Speech and behavior are normal.  ____________________________________________   LABS (all labs ordered are listed, but only abnormal results are displayed)  Results for orders placed or performed during the hospital encounter of 04/05/20 (from the past 24 hour(s))  CBC     Status: None   Collection Time: 04/05/20  7:21 PM  Result Value Ref Range   WBC 10.4 4.0 - 10.5 K/uL   RBC 4.54 3.87 - 5.11 MIL/uL   Hemoglobin 12.9 12.0 - 15.0 g/dL   HCT 39.3 36.0 - 46.0 %   MCV 86.6 80.0 - 100.0 fL   MCH 28.4 26.0 - 34.0 pg   MCHC 32.8 30.0 - 36.0 g/dL   RDW 14.5 11.5 - 15.5 %   Platelets 170 150 - 400 K/uL   nRBC 0.0 0.0 - 0.2 %  Basic metabolic panel      Status: Abnormal   Collection Time: 04/05/20  7:21 PM  Result Value Ref Range   Sodium 136 135 - 145 mmol/L   Potassium 4.1 3.5 - 5.1 mmol/L   Chloride 102 98 - 111 mmol/L   CO2 25 22 - 32 mmol/L   Glucose, Bld 107 (H) 70 - 99 mg/dL   BUN 16 8 - 23 mg/dL   Creatinine, Ser 0.74 0.44 - 1.00 mg/dL   Calcium 9.6 8.9 - 10.3 mg/dL   GFR, Estimated >60 >60 mL/min   Anion gap 9 5 - 15   ____________________________________________ ____________________________________________  RADIOLOGY  I personally reviewed all radiographic images ordered to evaluate for the above acute complaints and reviewed radiology reports and findings.  These findings were personally discussed with the patient.  Please see medical record for radiology report.  ____________________________________________   PROCEDURES  Procedure(s) performed:  Procedures    Critical Care performed: no ____________________________________________   INITIAL IMPRESSION / ASSESSMENT AND PLAN / ED COURSE  Pertinent labs & imaging results that were available during my care of the patient were reviewed by me and considered in my medical decision making (see chart for details).   DDX: Fracture, contusion, SDH, IPH, concussion, UTI, vaginal mass, bleeding, prolapse  SHONDRA CAPPS is a 87 y.o. who presents to the ED with presentation as described above.  Patient nontoxic-appearing.  CT imaging and x-ray ordered for location of discomfort.  CT spine cleared with Nexus criteria.  CT head with no evidence of SDH, IPH.  Not consistent with CVA.  Chest x-ray without any evidence of acute abnormality.  She does not have any focal tenderness to palpation have a lower suspicion for rib fracture.  Given her complaint of vaginal bleeding pelvic exam was performed with small amount of bleeding that appeared to be emanating from what appears to be probable prolapsed urethra.  Will check urine.  Blood work is otherwise reassuring.  Patient be  stable and appropriate for outpatient follow-up and referral.  Patient will be signed out to oncoming physician pending urinalysis.     The patient was evaluated in Emergency Department today for the symptoms described  in the history of present illness. He/she was evaluated in the context of the global COVID-19 pandemic, which necessitated consideration that the patient might be at risk for infection with the SARS-CoV-2 virus that causes COVID-19. Institutional protocols and algorithms that pertain to the evaluation of patients at risk for COVID-19 are in a state of rapid change based on information released by regulatory bodies including the CDC and federal and state organizations. These policies and algorithms were followed during the patient's care in the ED.  As part of my medical decision making, I reviewed the following data within the [place] notes reviewed and incorporated, Labs reviewed, notes from prior ED visits and [place] Controlled Substance Database   ____________________________________________   FINAL CLINICAL IMPRESSION(S) / ED DIAGNOSES  Final diagnoses:  Fall, initial encounter  Vaginal bleeding      NEW MEDICATIONS STARTED DURING THIS VISIT:  New Prescriptions   No medications on file     Note:  This document was prepared using Dragon voice recognition software and may include unintentional dictation errors.    Merlyn Lot, MD 04/05/20 2029

## 2020-04-08 ENCOUNTER — Other Ambulatory Visit: Payer: Self-pay

## 2020-04-08 ENCOUNTER — Ambulatory Visit: Payer: PPO | Admitting: Obstetrics and Gynecology

## 2020-04-08 VITALS — BP 142/78 | Wt 122.0 lb

## 2020-04-08 DIAGNOSIS — N368 Other specified disorders of urethra: Secondary | ICD-10-CM | POA: Diagnosis not present

## 2020-04-08 DIAGNOSIS — N8111 Cystocele, midline: Secondary | ICD-10-CM | POA: Diagnosis not present

## 2020-04-08 LAB — URINE CULTURE: Culture: >=100000 — AB

## 2020-04-08 NOTE — Progress Notes (Signed)
Obstetrics & Gynecology Office Visit   Chief Complaint:  Chief Complaint  Patient presents with  . Urinary Tract Infection    NP seen @ [facility] ER 04/05/20    History of Present Illness: 87 y.o. presenting following a fall on 4/5/20202.  Following fall noted some bleeding of unclear source vaginal/bladder/rectal.  She was started on Keflex for UTI with culture currently showing E. Coli with sensitivities  pending.  She does report some urgency and frequency.  Suffers from mixed incontinence at baseline although this has worsened with recent UTI.  She denies need to double void.  She has felt a vaginal bulge but does not have to splint to aid voiding or delectation.  Has had problems with constipation int he past.  She does have a glass of wine with dinner.     Review of Systems: Review of Systems  Constitutional: Negative.   Gastrointestinal: Positive for constipation. Negative for abdominal pain.  Genitourinary: Positive for dysuria, frequency and urgency. Negative for flank pain and hematuria.  Endo/Heme/Allergies: Negative for polydipsia.     Past Medical History:  Past Medical History:  Diagnosis Date  . Arthritis   . Breast cancer ([place]) 2012   left  breast  . Equilibrium disorder   . GERD (gastroesophageal reflux disease)   . Hypertension   . Hypothyroidism   . Personal history of radiation therapy   . Sleep apnea    uses CPAP    Past Surgical History:  Past Surgical History:  Procedure Laterality Date  . ABDOMINAL HYSTERECTOMY    . APPENDECTOMY    . BOTOX INJECTION N/A 09/22/2018   Procedure: Bladder BOTOX INJECTION;  Surgeon: Hollice Espy, MD;  Location: [facility] ORS;  Service: Urology;  Laterality: N/A;  General vs MAC for anesthesia  . BREAST BIOPSY Left 2012   Positive  . BREAST EXCISIONAL BIOPSY Right    at age 59's  . BREAST LUMPECTOMY Left 2012   F/U radiation   . BREAST LUMPECTOMY WITH AXILLARY LYMPH NODE BIOPSY Left 2012   with Radiation  . BREAST  SURGERY Left 2012   Lumpectomy  . CARDIAC CATHETERIZATION     10 years ago  . CATARACT EXTRACTION W/ INTRAOCULAR LENS  IMPLANT, BILATERAL Bilateral   . COLONOSCOPY    . JOINT REPLACEMENT    . TONSILLECTOMY    . TOTAL KNEE ARTHROPLASTY Left 07/21/2012   Dr Mayer Camel  . TOTAL KNEE ARTHROPLASTY Left 07/21/2012   Procedure: TOTAL KNEE ARTHROPLASTY;  Surgeon: Kerin Salen, MD;  Location: [place];  Service: Orthopedics;  Laterality: Left;    Gynecologic History: No LMP recorded. Patient has had a hysterectomy.  Obstetric History: No obstetric history on file.  Family History:  Family History  Problem Relation Age of Onset  . Alzheimer's disease Mother   . Heart attack Father   . Breast cancer Neg Hx     Social History:  Social History   Socioeconomic History  . Marital status: Married    Spouse name: Not on file  . Number of children: Not on file  . Years of education: Not on file  . Highest education level: Not on file  Occupational History  . Not on file  Tobacco Use  . Smoking status: Never Smoker  . Smokeless tobacco: Never Used  Vaping Use  . Vaping Use: Never used  Substance and Sexual Activity  . Alcohol use: Yes    Alcohol/week: 7.0 standard drinks    Types: 7 Glasses  of wine per week    Comment: social  . Drug use: No  . Sexual activity: Not Currently  Other Topics Concern  . Not on file  Social History Narrative  . Not on file   Social Determinants of Health   Financial Resource Strain: Not on file  Food Insecurity: Not on file  Transportation Needs: Not on file  Physical Activity: Not on file  Stress: Not on file  Social Connections: Not on file  Intimate Partner Violence: Not on file    Allergies:  Allergies  Allergen Reactions  . Sulfa Antibiotics Other (See Comments)    Unknown Unknown      Medications: Prior to Admission medications   Medication Sig Start Date End Date Taking? Authorizing Provider  amLODipine (NORVASC) 2.5 MG tablet  Take 2.5 mg by mouth 2 (two) times daily.  05/22/16  Yes [provider]  amoxicillin (AMOXIL) 500 MG capsule Take by mouth. 11/02/19   [provider]  Apoaequorin (PREVAGEN) 10 MG CAPS Take by mouth.    [provider]  aspirin EC 81 MG tablet Take 81 mg by mouth daily.    [provider]  azelastine (ASTELIN) 0.1 % nasal spray  10/28/19   [provider]  Calcium-Vitamin D-Vitamin K (VIACTIV CALCIUM PLUS D PO) Take 1 tablet by mouth daily.    [provider]  cephALEXin (KEFLEX) 500 MG capsule Take 1 capsule (500 mg total) by mouth 4 (four) times daily for 7 days. 04/05/20 04/12/20  Vladimir Crofts, MD  Cholecalciferol (VITAMIN D3 PO) Take 1 tablet by mouth daily.    [provider]  ciprofloxacin (CIPRO) 250 MG tablet Take 250 mg by mouth 2 (two) times daily. 02/10/19   [provider]  Coenzyme Q10 (COQ10 PO) Take 1 capsule by mouth daily.    [provider]  fluticasone Asencion Islam) 50 MCG/ACT nasal spray  11/06/18   [provider]  gentamicin ointment (GARAMYCIN) 0.1 % Apply 1 application topically 2 (two) times daily. Apply to bottom of nose for CPAP mask 01/09/16   [provider]  hydrochlorothiazide (MICROZIDE) 12.5 MG capsule Take 12.5 mg by mouth daily. 02/27/19   [provider]  LINZESS 72 MCG capsule Take 72 mcg by mouth every morning. 06/17/19   [provider]  losartan (COZAAR) 50 MG tablet Take 50 mg by mouth daily. 11/09/19   [provider]  MAGNESIUM-ZINC PO Take 1 tablet by mouth daily.    [provider]  meclizine (ANTIVERT) 25 MG tablet Take 25 mg by mouth 2 (two) times daily as needed. 07/24/19   [provider]  Menthol, Topical Analgesic, (BLUE-EMU MAXIMUM STRENGTH EX) Apply 1 application topically 2 (two) times daily as needed (pain).    [provider]  metoprolol succinate (TOPROL-XL) 25 MG 24 hr tablet Take 25 mg by mouth daily.   05/29/16   [provider]  Misc Natural Products (JOINT HEALTH PO) Take 1 capsule by mouth daily. Peak Mobility    [provider]  omeprazole (PRILOSEC) 20 MG capsule Take 20 mg by mouth daily. 02/19/19   [provider]  vitamin B-12 (CYANOCOBALAMIN) 100 MCG tablet Take 100 mcg by mouth daily.    [provider]    Physical Exam Vitals:  Vitals:   04/08/20 0931  BP: (!) 142/78   No LMP recorded. Patient has had a hysterectomy.  General: NAD HEENT: normocephalic, anicteric Pulmonary: No increased work of breathing Genitourinary:  External: Normal  external female genitalia.  Small urethral prolapse, normal Bartholin's and Skene's glands.    Vagina: Normal vaginal mucosa, anterior vaginal wall prolapse stopping just short of introitus with strain.      Cervix: surgically absent  Uterus: surgically absent  Adnexa: ovaries non-enlarged, no adnexal masses  Rectal: deferred  Lymphatic: no evidence of inguinal lymphadenopathy Extremities: no edema, erythema, or tenderness Neurologic: Grossly intact Psychiatric: mood appropriate, affect full  Female chaperone present for pelvic  portions of the physical exam  Assessment: 87 y.o. presenting for evaluation of recent episode of bleeding unclear source. Mild cystocele and some mixed incontinence symptoms  Plan: Problem List Items Addressed This Visit   None   Visit Diagnoses    Urethral prolapse    -  Primary   Cystocele, midline         1) Urethral prolapse - no current bleeding noted, denies noting bleeding with voiding.  Discussed vaginal estrogen may be reasonable but condition in itself is benign  2) Mild cystocele - no significant prolapse symptoms but does report frequency, urgency, and incontinence symptoms.  Discussed pessary may improve stress incontinence symptoms but will not help with urge incontinence.  I expect some improvement with pessary placement but no complete continence -  return for pessary fitting, procedure room   3) Bleeding - no evidence of bleeding today, unclear source following fall.  Patient is stats post prior hysterectomy so no concern for uterine origin  4) UTI - E. Coli currently on Keflex with sensitivities pending  5) Return in about 1 week (around 04/15/2020) for 1-2 week pessary fitting (procedure room).   Malachy Mood, MD, [facility], [facility] 04/08/2020, 5:19 PM

## 2020-04-08 NOTE — Patient Instructions (Signed)
Pelvic Organ Prolapse Pelvic organ prolapse is a condition in women that involves the stretching, bulging, or dropping of pelvic organs into an abnormal position, past the opening of the vagina. It happens when the muscles and tissues that surround and support pelvic structures become weak or stretched. Pelvic organ prolapse can involve the:  Vagina (vaginal prolapse).  Uterus (uterine prolapse).  Bladder (cystocele).  Rectum (rectocele).  Intestines (enterocele). When organs other than the vagina are involved, they often bulge into the vagina or protrude from the vagina, depending on how severe the prolapse is. What are the causes? This condition may be caused by:  Pregnancy, labor, and childbirth.  Past pelvic surgery.  Lower levels of the hormone estrogen due to menopause.  Consistently lifting more than 50 lb (23 kg).  Obesity.  Long-term difficulty passing stool (chronic constipation).  Long-term, or chronic, cough.  Fluid buildup in the abdomen due to certain conditions. What are the signs or symptoms? Symptoms of this condition include:  Leaking a little urine (loss of bladder control) when you cough, sneeze, strain, and exercise (stress incontinence). This may be worse immediately after childbirth. It may gradually improve over time.  Feeling pressure in your pelvis or vagina. This pressure may increase when you cough or when you are passing stool.  A bulge that protrudes from the opening of your vagina.  Difficulty passing urine or stool.  Pain in your lower back.  Pain or discomfort during sex, or decreased interest in sex.  Repeated bladder infections (urinary tract infections).  Difficulty inserting a tampon. In some people, this condition causes no symptoms. How is this diagnosed? This condition may be diagnosed based on a vaginal and rectal exam. During the exam, you may be asked to cough and strain while you are lying down, sitting, and standing up.  Your health care provider will determine if other tests are required, such as bladder function tests. How is this treated? Treatment for this condition may depend on your symptoms. Treatment may include:  Lifestyle changes, such as drinking plenty of fluids and eating foods that are high in fiber.  Emptying your bladder at scheduled times (bladder training therapy). This can help reduce or avoid urinary incontinence.  Estrogen. This may help mild prolapse by increasing the strength and tone of pelvic floor muscles.  Kegel exercises. These may help mild cases of prolapse by strengthening and tightening the muscles of the pelvic floor.  A soft, flexible device that helps support the vaginal walls and keep pelvic organs in place (pessary). This is inserted into your vagina by your health care provider.  Surgery. This is often the only form of treatment for severe prolapse. Follow these instructions at home: Eating and drinking  Avoid drinking beverages that contain caffeine or alcohol.  Increase your intake of high-fiber foods to decrease constipation and straining during bowel movements. Activity  Lose weight if recommended by your health care provider.  Avoid heavy lifting and straining with exercise and work. Do not hold your breath when you perform mild to moderate lifting and exercise activities. Limit your activities as directed by your health care provider.  Do Kegel exercises as directed by your health care provider. To do this: ? Squeeze your pelvic floor muscles tight. You should feel a tight lift in your rectal area and a tightness in your vaginal area. Keep your stomach, buttocks, and legs relaxed. ? Hold the muscles tight for up to 10 seconds. Then relax your muscles. ? Repeat this exercise  50 times a day, or as much as told by your health care provider. Continue to do this exercise for at least 4-6 weeks, or for as long as told by your health care provider. General  instructions  Take over-the-counter and prescription medicines only as told by your health care provider.  Wear a sanitary pad or adult diapers if you have urinary incontinence.  If you have a pessary, take care of it as told by your health care provider.  Keep all follow-up visits. This is important. Contact a health care provider if you:  Have symptoms that interfere with your daily activities or sex life.  Need medicine to help with the discomfort.  Notice bleeding from your vagina that is not related to your menstrual period.  Have a fever.  Have pain or bleeding when you urinate.  Have bleeding when you pass stool.  Pass urine when you have sex.  Have chronic constipation.  Have a pessary that falls out.  Have a foul-smelling vaginal discharge.  Have an unusual, low pain in your abdomen. Get help right away if you:  Cannot pass urine. Summary  Pelvic organ prolapse is the stretching, bulging, or dropping of pelvic organs into an abnormal position. It happens when the muscles and tissues that surround and support pelvic structures become weak or stretched.  When organs other than the vagina are involved, they often bulge into the vagina or protrude from it, depending on how severe the prolapse is.  In most cases, this condition needs to be treated only if it produces symptoms. Treatment may include lifestyle changes, estrogen, Kegel exercises, pessary insertion, or surgery.  Avoid heavy lifting and straining with exercise and work. Do not hold your breath when you perform mild to moderate lifting and exercise activities. Limit your activities as directed by your health care provider. This information is not intended to replace advice given to you by your health care provider. Make sure you discuss any questions you have with your health care provider. Document Revised: 06/15/2019 Document Reviewed: 06/15/2019 Elsevier Patient Education  [place].

## 2020-04-13 DIAGNOSIS — G4733 Obstructive sleep apnea (adult) (pediatric): Secondary | ICD-10-CM | POA: Diagnosis not present

## 2020-04-18 ENCOUNTER — Telehealth: Payer: Self-pay

## 2020-04-18 NOTE — Telephone Encounter (Signed)
Pt's daughter, Juliann Pulse, called after hour nurse stating pt was recently seen for a UTI and was rx'd cephalexin, but she is not having lower back pain and urination  Urgency, denies a fever, caller states she was seen last Fri in the office; finished the pills a couple of days ago; her sxs came back quickly.  Pryor rx'd keflex.

## 2020-04-25 ENCOUNTER — Ambulatory Visit (INDEPENDENT_AMBULATORY_CARE_PROVIDER_SITE_OTHER): Payer: PPO | Admitting: Obstetrics and Gynecology

## 2020-04-25 ENCOUNTER — Other Ambulatory Visit: Payer: Self-pay

## 2020-04-25 ENCOUNTER — Encounter: Payer: Self-pay | Admitting: Obstetrics and Gynecology

## 2020-04-25 VITALS — BP 138/72 | Wt 122.0 lb

## 2020-04-25 DIAGNOSIS — B9620 Unspecified Escherichia coli [E. coli] as the cause of diseases classified elsewhere: Secondary | ICD-10-CM

## 2020-04-25 DIAGNOSIS — Z4689 Encounter for fitting and adjustment of other specified devices: Secondary | ICD-10-CM | POA: Diagnosis not present

## 2020-04-25 DIAGNOSIS — N390 Urinary tract infection, site not specified: Secondary | ICD-10-CM

## 2020-04-25 DIAGNOSIS — G4733 Obstructive sleep apnea (adult) (pediatric): Secondary | ICD-10-CM | POA: Diagnosis not present

## 2020-04-25 NOTE — Progress Notes (Signed)
Obstetrics & Gynecology Office Visit   Chief Complaint: No chief complaint on file.   History of Present Illness: 87 y.o. presenting for pessary fitting visit for mixed incontinence and mild cystocele.  At her last visit we discussed options for her treatment of her incontinence and she was interested in trial of pessary to see if any improvement.  In addition the patient was recently treated for pansensitive E. Coli UTI.   Review of Systems:  Review of Systems  Constitutional: Negative.   Gastrointestinal: Negative for abdominal pain.  Genitourinary: Positive for frequency and urgency. Negative for dysuria, flank pain and hematuria.  Skin: Negative.      Past Medical History:  Past Medical History:  Diagnosis Date  . Arthritis   . Breast cancer ([place]) 2012   left  breast  . Equilibrium disorder   . GERD (gastroesophageal reflux disease)   . Hypertension   . Hypothyroidism   . Personal history of radiation therapy   . Sleep apnea    uses CPAP    Past Surgical History:  Past Surgical History:  Procedure Laterality Date  . ABDOMINAL HYSTERECTOMY    . APPENDECTOMY    . BOTOX INJECTION N/A 09/22/2018   Procedure: Bladder BOTOX INJECTION;  Surgeon: Hollice Espy, MD;  Location: [facility];  Service: Urology;  Laterality: N/A;  General vs MAC for anesthesia  . BREAST BIOPSY Left 2012   Positive  . BREAST EXCISIONAL BIOPSY Right    at age 68's  . BREAST LUMPECTOMY Left 2012   F/U radiation   . BREAST LUMPECTOMY WITH AXILLARY LYMPH NODE BIOPSY Left 2012   with Radiation  . BREAST SURGERY Left 2012   Lumpectomy  . CARDIAC CATHETERIZATION     10 years ago  . CATARACT EXTRACTION W/ INTRAOCULAR LENS  IMPLANT, BILATERAL Bilateral   . COLONOSCOPY    . JOINT REPLACEMENT    . TONSILLECTOMY    . TOTAL KNEE ARTHROPLASTY Left 07/21/2012   Dr Mayer Camel  . TOTAL KNEE ARTHROPLASTY Left 07/21/2012   Procedure: TOTAL KNEE ARTHROPLASTY;  Surgeon: Kerin Salen, MD;  Location: [facility];   Service: Orthopedics;  Laterality: Left;    Gynecologic History: No LMP recorded. Patient has had a hysterectomy.  Obstetric History: No obstetric history on file.  Family History:  Family History  Problem Relation Age of Onset  . Alzheimer's disease Mother   . Heart attack Father   . Breast cancer Neg Hx     Social History:  Social History   Socioeconomic History  . Marital status: Married    Spouse name: Not on file  . Number of children: Not on file  . Years of education: Not on file  . Highest education level: Not on file  Occupational History  . Not on file  Tobacco Use  . Smoking status: Never Smoker  . Smokeless tobacco: Never Used  Vaping Use  . Vaping Use: Never used  Substance and Sexual Activity  . Alcohol use: Yes    Alcohol/week: 7.0 standard drinks    Types: 7 Glasses of wine per week    Comment: social  . Drug use: No  . Sexual activity: Not Currently  Other Topics Concern  . Not on file  Social History Narrative  . Not on file   Social Determinants of Health   Financial Resource Strain: Not on file  Food Insecurity: Not on file  Transportation Needs: Not on file  Physical Activity: Not on file  Stress: Not on file  Social Connections: Not on file  Intimate Partner Violence: Not on file    Allergies:  Allergies  Allergen Reactions  . Sulfa Antibiotics Other (See Comments)    Unknown Unknown      Medications: Prior to Admission medications   Medication Sig Start Date End Date Taking? Authorizing Provider  amLODipine (NORVASC) 2.5 MG tablet Take 2.5 mg by mouth 2 (two) times daily.  05/22/16   [provider]  amoxicillin (AMOXIL) 500 MG capsule Take by mouth. 11/02/19   [provider]  Apoaequorin (PREVAGEN) 10 MG CAPS Take by mouth.    [provider]  aspirin EC 81 MG tablet Take 81 mg by mouth daily.    [provider]  azelastine (ASTELIN) 0.1 % nasal spray  10/28/19   [provider]   Calcium-Vitamin D-Vitamin K (VIACTIV CALCIUM PLUS D PO) Take 1 tablet by mouth daily.    [provider]  Cholecalciferol (VITAMIN D3 PO) Take 1 tablet by mouth daily.    [provider]  ciprofloxacin (CIPRO) 250 MG tablet Take 250 mg by mouth 2 (two) times daily. 02/10/19   [provider]  Coenzyme Q10 (COQ10 PO) Take 1 capsule by mouth daily.    [provider]  fluticasone Asencion Islam) 50 MCG/ACT nasal spray  11/06/18   [provider]  gentamicin ointment (GARAMYCIN) 0.1 % Apply 1 application topically 2 (two) times daily. Apply to bottom of nose for CPAP mask 01/09/16   [provider]  hydrochlorothiazide (MICROZIDE) 12.5 MG capsule Take 12.5 mg by mouth daily. 02/27/19   [provider]  LINZESS 72 MCG capsule Take 72 mcg by mouth every morning. 06/17/19   [provider]  losartan (COZAAR) 50 MG tablet Take 50 mg by mouth daily. 11/09/19   [provider]  MAGNESIUM-ZINC PO Take 1 tablet by mouth daily.    [provider]  meclizine (ANTIVERT) 25 MG tablet Take 25 mg by mouth 2 (two) times daily as needed. 07/24/19   [provider]  Menthol, Topical Analgesic, (BLUE-EMU MAXIMUM STRENGTH EX) Apply 1 application topically 2 (two) times daily as needed (pain).    [provider]  metoprolol succinate (TOPROL-XL) 25 MG 24 hr tablet Take 25 mg by mouth daily.  05/29/16   [provider]  Misc Natural Products (JOINT HEALTH PO) Take 1 capsule by mouth daily. [employer]    [provider]  omeprazole (PRILOSEC) 20 MG capsule Take 20 mg by mouth daily. 02/19/19   [provider]  vitamin B-12 (CYANOCOBALAMIN) 100 MCG tablet Take 100 mcg by mouth daily.    [provider]    Physical Exam Vitals:  Vitals:   04/25/20 1405  BP: 138/72   No LMP recorded. Patient has had a hysterectomy.  General: NAD HEENT: normocephalic, anicteric Pulmonary: No increased  work of breathing Genitourinary:  External: Normal external female genitalia.  Normal urethral meatus, normal Bartholin's and Skene's glands.   A 2" ring with support tester was placed.  The patient was asked to walk, strain, and bear down.  No movement noted in pessary and patient reporting no discomfort.  Tester replaced with 62m ring with support.  Vagina: Normal vaginal mucosa, mild cystocele as previously noted Extremities: no edema, erythema, or tenderness Neurologic: Grossly intact Psychiatric: mood appropriate, affect full  Female chaperone present for pelvic  portions of the physical exam  Assessment: 87y.o. presenting for pessary fitting  Plan: Problem  List Items Addressed This Visit      Genitourinary   E. coli UTI (urinary tract infection), fluoroquinolone resistant - Primary   Relevant Orders   Urine Culture    Other Visit Diagnoses    Fitting and adjustment of pessary         1) Cystocele, mixed incontience - Size 2 (45m) incontinence ring placed  2) Recent UTI - will obtain TOC today  3)  Return in about 1 week (around 05/02/2020) for pessary check (procedure room).    AMalachy Mood MD, [facility], [facility] 04/25/2020, 2:43 PM

## 2020-04-28 ENCOUNTER — Telehealth: Payer: Self-pay

## 2020-04-28 NOTE — Telephone Encounter (Signed)
Pt's dtr, Lanna Poche, calling; pt had pessery inserted last week; f/u appt sched for Wed of next week; pt is VERY uncomfortable; says she feels it moving around inside her.  Could she possible be seen by someone tomorrow morning as she has another appt in the area after lunch?  [phone] Juliann Pulse is on DPR)

## 2020-04-29 ENCOUNTER — Encounter: Payer: Self-pay | Admitting: Obstetrics & Gynecology

## 2020-04-29 ENCOUNTER — Ambulatory Visit: Payer: PPO | Admitting: Obstetrics & Gynecology

## 2020-04-29 ENCOUNTER — Other Ambulatory Visit: Payer: Self-pay

## 2020-04-29 VITALS — BP 120/80 | Ht 61.0 in | Wt 120.0 lb

## 2020-04-29 DIAGNOSIS — R42 Dizziness and giddiness: Secondary | ICD-10-CM | POA: Diagnosis not present

## 2020-04-29 DIAGNOSIS — N8111 Cystocele, midline: Secondary | ICD-10-CM | POA: Diagnosis not present

## 2020-04-29 DIAGNOSIS — H6121 Impacted cerumen, right ear: Secondary | ICD-10-CM | POA: Diagnosis not present

## 2020-04-29 DIAGNOSIS — N368 Other specified disorders of urethra: Secondary | ICD-10-CM | POA: Diagnosis not present

## 2020-04-29 DIAGNOSIS — J330 Polyp of nasal cavity: Secondary | ICD-10-CM | POA: Diagnosis not present

## 2020-04-29 DIAGNOSIS — G4733 Obstructive sleep apnea (adult) (pediatric): Secondary | ICD-10-CM | POA: Diagnosis not present

## 2020-04-29 DIAGNOSIS — R3915 Urgency of urination: Secondary | ICD-10-CM | POA: Diagnosis not present

## 2020-04-29 LAB — URINE CULTURE

## 2020-04-29 NOTE — Telephone Encounter (Signed)
Patient is scheduled 04/29/20 with [facility]

## 2020-04-29 NOTE — Progress Notes (Signed)
HPI:      [name] is a 87 y.o.  who presents today for her pessary follow up and examination related to her pelvic floor weakening due to recent placement of new #2 INCONTINENCE RING 4 days ago and now having position discomfort w the pessary.  It is fine while she sleeps but when she is up and active she feels the pessary and it is painful at times and she has an adjustment in her stream.  She has no change in urgency, in fact it may be worse w the pessary.   No bleeding.  Feels she is getting over her recent UTI.  PMHx: She  has a past medical history of Arthritis, Breast cancer ([place]) (2012), Equilibrium disorder, GERD (gastroesophageal reflux disease), Hypertension, Hypothyroidism, Personal history of radiation therapy, and Sleep apnea. Also,  has a past surgical history that includes Cardiac catheterization; Breast surgery (Left, 2012); Tonsillectomy; Appendectomy; Abdominal hysterectomy; Total knee arthroplasty (Left, 07/21/2012); Total knee arthroplasty (Left, 07/21/2012); Breast lumpectomy with axillary lymph node biopsy (Left, 2012); Breast biopsy (Left, 2012); Breast excisional biopsy (Right); Breast lumpectomy (Left, 2012); Joint replacement; Cataract extraction w/ intraocular lens  implant, bilateral (Bilateral); Colonoscopy; and Botox injection (N/A, 09/22/2018)., family history includes Alzheimer's disease in her mother; Heart attack in her father.,  reports that she has never smoked. She has never used smokeless tobacco. She reports current alcohol use of about 7.0 standard drinks of alcohol per week. She reports that she does not use drugs.  She has a current medication list which includes the following prescription(s): amlodipine, amoxicillin, prevagen, aspirin ec, azelastine, calcium-vitamin d-vitamin k, cholecalciferol, ciprofloxacin, coenzyme q10, fluticasone, gentamicin ointment, hydrochlorothiazide, linzess, losartan, magnesium-zinc, meclizine, menthol (topical analgesic),  metoprolol succinate, misc natural products, omeprazole, and vitamin b-12. Also, is allergic to sulfa antibiotics.  Review of Systems  Genitourinary: Positive for urgency.  All other systems reviewed and are negative.   Objective: BP 120/80   Ht 5\' 1"  (1.549 m)   Wt 120 lb (54.4 kg)   BMI 22.67 kg/m  Physical Exam Constitutional:      General: She is not in acute distress.    Appearance: She is well-developed.  Genitourinary:     Genitourinary Comments: Cuff intact/ no lesions  Absent uterus and cervix     No vaginal erythema or bleeding.  HENT:     Head: Normocephalic and atraumatic.     Nose: Nose normal.  Abdominal:     General: There is no distension.     Palpations: Abdomen is soft.     Tenderness: There is no abdominal tenderness.  Musculoskeletal:        General: Normal range of motion.  Neurological:     Mental Status: She is alert and oriented to person, place, and time.     Cranial Nerves: No cranial nerve deficit.  Skin:    General: Skin is warm and dry.  Psychiatric:        Attention and Perception: Attention normal.        Mood and Affect: Mood normal.        Speech: Speech normal.        Behavior: Behavior normal.        Cognition and Memory: Cognition normal.        Judgment: Judgment normal.     Pessary Care Pessary removed  No erosions. Difficult vag exam due to pt discomfort Urethra mildly prolpasing  A/P:   ICD-10-CM   1. Cystocele, midline  N81.11  2. 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 Urgency of urination  R39.15    Pessary was removed and left out for now per pt request    Discussed urgency which is her main sx    Consider med tx (prior Botox injections no help)    Consider ring (not incont ring) as the pessary and bulge of the knob was uncomfortable to the pt She has f/u next week for which she will keep appt Concerning symptoms to observe for are counseled to patient.   A total of 20 minutes were spent face-to-face with the patient  as well as preparation, review, communication, and documentation during this encounter.   Barnett Applebaum, MD, [facility], [facility] 04/29/2020  10:55 AM

## 2020-05-04 ENCOUNTER — Other Ambulatory Visit: Payer: Self-pay | Admitting: Obstetrics and Gynecology

## 2020-05-04 ENCOUNTER — Other Ambulatory Visit: Payer: Self-pay

## 2020-05-04 ENCOUNTER — Ambulatory Visit (INDEPENDENT_AMBULATORY_CARE_PROVIDER_SITE_OTHER): Payer: PPO | Admitting: Obstetrics and Gynecology

## 2020-05-04 ENCOUNTER — Encounter: Payer: Self-pay | Admitting: Obstetrics and Gynecology

## 2020-05-04 VITALS — BP 128/78 | Wt 124.0 lb

## 2020-05-04 DIAGNOSIS — N3000 Acute cystitis without hematuria: Secondary | ICD-10-CM | POA: Diagnosis not present

## 2020-05-04 MED ORDER — LEVOFLOXACIN 250 MG PO TABS: 250.0000 mg | ORAL_TABLET | Freq: Every day | ORAL | 0 refills | Status: AC

## 2020-05-05 ENCOUNTER — Telehealth: Payer: Self-pay

## 2020-05-05 NOTE — Telephone Encounter (Signed)
Pt calling; wants to talk c Lori Wiggins about med that was rx'd yesterday.  [phone]  Pt states the information that came with the rx has scared her to death - permanent neurological damage.  Adv this is a common antibx; haven't heard of anyone having an adverse effect from taking it; they have to put every little side effect that happens during trials on the info to cover themselves.  Pt admits to being a worry wart but says she will go ahead and take it.  She also thought it would help with her incontinence; adv it doesn't.

## 2020-05-09 ENCOUNTER — Other Ambulatory Visit: Payer: Self-pay | Admitting: Obstetrics and Gynecology

## 2020-05-09 DIAGNOSIS — R8279 Other abnormal findings on microbiological examination of urine: Secondary | ICD-10-CM

## 2020-05-09 NOTE — Progress Notes (Signed)
Obstetrics & Gynecology Office Visit   Chief Complaint:  Chief Complaint  Patient presents with  . Follow-up    Pessary removed by [facility] 04/29/20, per pt feels like she is sitting on a corn comb. RM 4    History of Present Illness: 87 y.o. female presenting for one week follow up for trial of size 2 incontinence ring.  Patient has pessary removed 3 days after placement by Dr. Kenton Kingfisher secondary to discomfort.  Urine culture to follow up on previously treated E. Coli UTI.  She continued to have frequency and urge incontinence.  No hematuria.     Review of Systems: review of systems negative unless noted in HPI  Past Medical History:  Past Medical History:  Diagnosis Date  . Arthritis   . Breast cancer ([place]) 2012   left  breast  . Equilibrium disorder   . GERD (gastroesophageal reflux disease)   . Hypertension   . Hypothyroidism   . Personal history of radiation therapy   . Sleep apnea    uses CPAP    Past Surgical History:  Past Surgical History:  Procedure Laterality Date  . ABDOMINAL HYSTERECTOMY    . APPENDECTOMY    . BOTOX INJECTION N/A 09/22/2018   Procedure: Bladder BOTOX INJECTION;  Surgeon: Hollice Espy, MD;  Location: [facility] ORS;  Service: Urology;  Laterality: N/A;  General vs MAC for anesthesia  . BREAST BIOPSY Left 2012   Positive  . BREAST EXCISIONAL BIOPSY Right    at age 44's  . BREAST LUMPECTOMY Left 2012   F/U radiation   . BREAST LUMPECTOMY WITH AXILLARY LYMPH NODE BIOPSY Left 2012   with Radiation  . BREAST SURGERY Left 2012   Lumpectomy  . CARDIAC CATHETERIZATION     10 years ago  . CATARACT EXTRACTION W/ INTRAOCULAR LENS  IMPLANT, BILATERAL Bilateral   . COLONOSCOPY    . JOINT REPLACEMENT    . TONSILLECTOMY    . TOTAL KNEE ARTHROPLASTY Left 07/21/2012   Dr Mayer Camel  . TOTAL KNEE ARTHROPLASTY Left 07/21/2012   Procedure: TOTAL KNEE ARTHROPLASTY;  Surgeon: Kerin Salen, MD;  Location: [facility];  Service: Orthopedics;  Laterality: Left;     Gynecologic History: No LMP recorded. Patient has had a hysterectomy.  Obstetric History: No obstetric history on file.  Family History:  Family History  Problem Relation Age of Onset  . Alzheimer's disease Mother   . Heart attack Father   . Breast cancer Neg Hx     Social History:  Social History   Socioeconomic History  . Marital status: Married    Spouse name: Not on file  . Number of children: Not on file  . Years of education: Not on file  . Highest education level: Not on file  Occupational History  . Not on file  Tobacco Use  . Smoking status: Never Smoker  . Smokeless tobacco: Never Used  Vaping Use  . Vaping Use: Never used  Substance and Sexual Activity  . Alcohol use: Yes    Alcohol/week: 7.0 standard drinks    Types: 7 Glasses of wine per week    Comment: social  . Drug use: No  . Sexual activity: Not Currently  Other Topics Concern  . Not on file  Social History Narrative  . Not on file   Social Determinants of Health   Financial Resource Strain: Not on file  Food Insecurity: Not on file  Transportation Needs: Not on file  Physical Activity:  Not on file  Stress: Not on file  Social Connections: Not on file  Intimate Partner Violence: Not on file    Allergies:  Allergies  Allergen Reactions  . Sulfa Antibiotics Other (See Comments)    Unknown Unknown      Medications: Prior to Admission medications   Medication Sig Start Date End Date Taking? Authorizing Provider  amLODipine (NORVASC) 2.5 MG tablet Take 2.5 mg by mouth 2 (two) times daily.  05/22/16   [provider]  amoxicillin (AMOXIL) 500 MG capsule Take by mouth. 11/02/19   [provider]  Apoaequorin (PREVAGEN) 10 MG CAPS Take by mouth.    [provider]  aspirin EC 81 MG tablet Take 81 mg by mouth daily.    [provider]  azelastine (ASTELIN) 0.1 % nasal spray  10/28/19   [provider]  Calcium-Vitamin D-Vitamin K (VIACTIV  CALCIUM PLUS D PO) Take 1 tablet by mouth daily.    [provider]  Cholecalciferol (VITAMIN D3 PO) Take 1 tablet by mouth daily.    [provider]  ciprofloxacin (CIPRO) 250 MG tablet Take 250 mg by mouth 2 (two) times daily. 02/10/19   [provider]  Coenzyme Q10 (COQ10 PO) Take 1 capsule by mouth daily.    [provider]  fluticasone Asencion Islam) 50 MCG/ACT nasal spray  11/06/18   [provider]  gentamicin ointment (GARAMYCIN) 0.1 % Apply 1 application topically 2 (two) times daily. Apply to bottom of nose for CPAP mask 01/09/16   [provider]  hydrochlorothiazide (MICROZIDE) 12.5 MG capsule Take 12.5 mg by mouth daily. 02/27/19   [provider]  LINZESS 72 MCG capsule Take 72 mcg by mouth every morning. 06/17/19   [provider]  losartan (COZAAR) 50 MG tablet Take 50 mg by mouth daily. 11/09/19   [provider]  MAGNESIUM-ZINC PO Take 1 tablet by mouth daily.    [provider]  meclizine (ANTIVERT) 25 MG tablet Take 25 mg by mouth 2 (two) times daily as needed. 07/24/19   [provider]  Menthol, Topical Analgesic, (BLUE-EMU MAXIMUM STRENGTH EX) Apply 1 application topically 2 (two) times daily as needed (pain).    [provider]  metoprolol succinate (TOPROL-XL) 25 MG 24 hr tablet Take 25 mg by mouth daily.  05/29/16   [provider]  Misc Natural Products (JOINT HEALTH PO) Take 1 capsule by mouth daily. [employer]    [provider]  omeprazole (PRILOSEC) 20 MG capsule Take 20 mg by mouth daily. 02/19/19   [provider]  vitamin B-12 (CYANOCOBALAMIN) 100 MCG tablet Take 100 mcg by mouth daily.    [provider]    Physical Exam Vitals:  Vitals:   05/04/20 1517  BP: 128/78   No LMP recorded. Patient has had a hysterectomy.  General: NAD HEENT: normocephalic, anicteric Pulmonary: No increased work of breathing Neurologic: Grossly  intact Psychiatric: mood appropriate, affect full  Female chaperone present for pelvic  portions of the physical exam  Assessment: 87 y.o. with pseudomonas UTI, intolerance to trial of pessary  Plan: Problem List Items Addressed This Visit   None   Visit Diagnoses    Acute cystitis without hematuria    -  Primary   Relevant Orders   Urine Culture     1) Pessary - did not tolerate discussed size 2 incontinence ring is relatively small and if uncomfortable would hold of on pessary at present.  WIll see  if improvement in UTI symptoms follow treatment with levaquin for pseudamonas.  Will obtain repeat culture to document clearance as some of her current symptoms may be attributed to acute cystitis.    2) A total of 15 minutes were spent in face-to-face contact with the patient during this encounter with over half of that time devoted to counseling and coordination of care.  3) Return will call with results of urine culture once received.   Malachy Mood, MD, [facility], [place] 05/09/2020, 12:53 PM

## 2020-05-10 ENCOUNTER — Other Ambulatory Visit: Payer: Self-pay

## 2020-05-10 DIAGNOSIS — R8279 Other abnormal findings on microbiological examination of urine: Secondary | ICD-10-CM

## 2020-05-12 ENCOUNTER — Telehealth: Payer: Self-pay

## 2020-05-12 NOTE — Telephone Encounter (Signed)
Pt's dtr, Lanna Poche, returning Donna's call.  [phone]

## 2020-05-16 LAB — URINE CULTURE

## 2020-05-17 DIAGNOSIS — G4733 Obstructive sleep apnea (adult) (pediatric): Secondary | ICD-10-CM | POA: Diagnosis not present

## 2020-05-17 DIAGNOSIS — R270 Ataxia, unspecified: Secondary | ICD-10-CM | POA: Diagnosis not present

## 2020-05-17 DIAGNOSIS — Z9989 Dependence on other enabling machines and devices: Secondary | ICD-10-CM | POA: Diagnosis not present

## 2020-05-17 DIAGNOSIS — I10 Essential (primary) hypertension: Secondary | ICD-10-CM | POA: Diagnosis not present

## 2020-05-18 ENCOUNTER — Ambulatory Visit: Payer: PPO | Admitting: Podiatry

## 2020-05-18 ENCOUNTER — Encounter: Payer: Self-pay | Admitting: Podiatry

## 2020-05-18 ENCOUNTER — Other Ambulatory Visit: Payer: Self-pay

## 2020-05-18 DIAGNOSIS — B351 Tinea unguium: Secondary | ICD-10-CM | POA: Diagnosis not present

## 2020-05-18 DIAGNOSIS — M79676 Pain in unspecified toe(s): Secondary | ICD-10-CM | POA: Diagnosis not present

## 2020-05-18 NOTE — Progress Notes (Signed)
Urine culture did not show any significant bacterial growth this time

## 2020-05-18 NOTE — Progress Notes (Signed)
She presents today chief complaint of painful elongated toenails.  Objective: Pulses are palpable.  Toenails are long thick yellow dystrophic-like mycotic.  Assessment: Pain limb secondary onychomycosis.  Plan: Debridement of toenails 1 through 5 bilateral.

## 2020-05-24 ENCOUNTER — Telehealth: Payer: Self-pay | Admitting: Obstetrics and Gynecology

## 2020-05-24 DIAGNOSIS — N3000 Acute cystitis without hematuria: Secondary | ICD-10-CM | POA: Diagnosis not present

## 2020-05-24 DIAGNOSIS — I10 Essential (primary) hypertension: Secondary | ICD-10-CM | POA: Diagnosis not present

## 2020-05-24 DIAGNOSIS — Z9989 Dependence on other enabling machines and devices: Secondary | ICD-10-CM | POA: Diagnosis not present

## 2020-05-24 DIAGNOSIS — G4733 Obstructive sleep apnea (adult) (pediatric): Secondary | ICD-10-CM | POA: Diagnosis not present

## 2020-05-24 DIAGNOSIS — R270 Ataxia, unspecified: Secondary | ICD-10-CM | POA: Diagnosis not present

## 2020-05-24 NOTE — Telephone Encounter (Signed)
Patient's daughter (Ms. Adams) has called about her mother's urine sample that was dropped off for culture about 2 weeks ago.  She has an appt with her pcp at 11:15 and would like to be able to tell the provider about the result.

## 2020-05-24 NOTE — Telephone Encounter (Signed)
Spoke with Lori Wiggins, gave results of urine culture which were negative. Patient's daughter verbalized understanding. drl

## 2020-06-02 DIAGNOSIS — G4733 Obstructive sleep apnea (adult) (pediatric): Secondary | ICD-10-CM | POA: Diagnosis not present

## 2020-06-28 DIAGNOSIS — G4733 Obstructive sleep apnea (adult) (pediatric): Secondary | ICD-10-CM | POA: Diagnosis not present

## 2020-07-26 DIAGNOSIS — G4733 Obstructive sleep apnea (adult) (pediatric): Secondary | ICD-10-CM | POA: Diagnosis not present

## 2020-08-24 ENCOUNTER — Ambulatory Visit: Payer: PPO | Admitting: Podiatry

## 2020-08-24 ENCOUNTER — Other Ambulatory Visit: Payer: Self-pay

## 2020-08-24 ENCOUNTER — Encounter: Payer: Self-pay | Admitting: Podiatry

## 2020-08-24 DIAGNOSIS — M79676 Pain in unspecified toe(s): Secondary | ICD-10-CM | POA: Diagnosis not present

## 2020-08-24 DIAGNOSIS — B351 Tinea unguium: Secondary | ICD-10-CM | POA: Diagnosis not present

## 2020-08-24 NOTE — Progress Notes (Signed)
She presents today chief complaint of painfully elongated toenails.  Objective: Pulses are palpable.  Hammertoe deformities resulting in tenderness on palpation otherwise toenails are long thick yellow dystrophic-like mycotic.  Assessment: Pain limb secondary to onychomycosis.  Plan: Debridement of toenails 1 through 5 bilateral

## 2020-09-01 DIAGNOSIS — G4733 Obstructive sleep apnea (adult) (pediatric): Secondary | ICD-10-CM | POA: Diagnosis not present

## 2020-09-01 DIAGNOSIS — H6121 Impacted cerumen, right ear: Secondary | ICD-10-CM | POA: Diagnosis not present

## 2020-09-01 DIAGNOSIS — J330 Polyp of nasal cavity: Secondary | ICD-10-CM | POA: Diagnosis not present

## 2020-09-02 DIAGNOSIS — S61412A Laceration without foreign body of left hand, initial encounter: Secondary | ICD-10-CM | POA: Diagnosis not present

## 2020-09-02 DIAGNOSIS — I6529 Occlusion and stenosis of unspecified carotid artery: Secondary | ICD-10-CM | POA: Diagnosis not present

## 2020-09-02 DIAGNOSIS — M47812 Spondylosis without myelopathy or radiculopathy, cervical region: Secondary | ICD-10-CM | POA: Diagnosis not present

## 2020-09-02 DIAGNOSIS — S199XXA Unspecified injury of neck, initial encounter: Secondary | ICD-10-CM | POA: Diagnosis not present

## 2020-09-07 DIAGNOSIS — H04221 Epiphora due to insufficient drainage, right lacrimal gland: Secondary | ICD-10-CM | POA: Diagnosis not present

## 2020-09-13 DIAGNOSIS — I10 Essential (primary) hypertension: Secondary | ICD-10-CM | POA: Diagnosis not present

## 2020-09-13 DIAGNOSIS — Z8673 Personal history of transient ischemic attack (TIA), and cerebral infarction without residual deficits: Secondary | ICD-10-CM | POA: Diagnosis not present

## 2020-09-13 DIAGNOSIS — R531 Weakness: Secondary | ICD-10-CM | POA: Diagnosis not present

## 2020-09-13 DIAGNOSIS — R42 Dizziness and giddiness: Secondary | ICD-10-CM | POA: Diagnosis not present

## 2020-09-13 DIAGNOSIS — R350 Frequency of micturition: Secondary | ICD-10-CM | POA: Diagnosis not present

## 2020-09-20 DIAGNOSIS — R8290 Unspecified abnormal findings in urine: Secondary | ICD-10-CM | POA: Diagnosis not present

## 2020-09-20 DIAGNOSIS — R399 Unspecified symptoms and signs involving the genitourinary system: Secondary | ICD-10-CM | POA: Diagnosis not present

## 2020-10-05 DIAGNOSIS — M6281 Muscle weakness (generalized): Secondary | ICD-10-CM | POA: Diagnosis not present

## 2020-10-05 DIAGNOSIS — R2689 Other abnormalities of gait and mobility: Secondary | ICD-10-CM | POA: Diagnosis not present

## 2020-10-05 DIAGNOSIS — Z9181 History of falling: Secondary | ICD-10-CM | POA: Diagnosis not present

## 2020-10-05 DIAGNOSIS — R2681 Unsteadiness on feet: Secondary | ICD-10-CM | POA: Diagnosis not present

## 2020-10-14 DIAGNOSIS — M6281 Muscle weakness (generalized): Secondary | ICD-10-CM | POA: Diagnosis not present

## 2020-10-14 DIAGNOSIS — Z9181 History of falling: Secondary | ICD-10-CM | POA: Diagnosis not present

## 2020-10-28 DIAGNOSIS — G4733 Obstructive sleep apnea (adult) (pediatric): Secondary | ICD-10-CM | POA: Diagnosis not present

## 2020-11-22 DIAGNOSIS — G4733 Obstructive sleep apnea (adult) (pediatric): Secondary | ICD-10-CM | POA: Diagnosis not present

## 2020-11-22 DIAGNOSIS — Z9989 Dependence on other enabling machines and devices: Secondary | ICD-10-CM | POA: Diagnosis not present

## 2020-11-22 DIAGNOSIS — I10 Essential (primary) hypertension: Secondary | ICD-10-CM | POA: Diagnosis not present

## 2020-11-30 ENCOUNTER — Ambulatory Visit: Payer: PPO | Admitting: Podiatry

## 2020-11-30 DIAGNOSIS — Z8673 Personal history of transient ischemic attack (TIA), and cerebral infarction without residual deficits: Secondary | ICD-10-CM | POA: Diagnosis not present

## 2020-11-30 DIAGNOSIS — R300 Dysuria: Secondary | ICD-10-CM | POA: Diagnosis not present

## 2020-11-30 DIAGNOSIS — I10 Essential (primary) hypertension: Secondary | ICD-10-CM | POA: Diagnosis not present

## 2020-11-30 DIAGNOSIS — Z0000 Encounter for general adult medical examination without abnormal findings: Secondary | ICD-10-CM | POA: Diagnosis not present

## 2020-11-30 DIAGNOSIS — Z9989 Dependence on other enabling machines and devices: Secondary | ICD-10-CM | POA: Diagnosis not present

## 2020-11-30 DIAGNOSIS — G4733 Obstructive sleep apnea (adult) (pediatric): Secondary | ICD-10-CM | POA: Diagnosis not present

## 2020-12-13 DIAGNOSIS — N6489 Other specified disorders of breast: Secondary | ICD-10-CM | POA: Diagnosis not present

## 2020-12-14 ENCOUNTER — Ambulatory Visit: Payer: PPO | Admitting: Podiatry

## 2020-12-14 ENCOUNTER — Encounter: Payer: Self-pay | Admitting: Podiatry

## 2020-12-14 ENCOUNTER — Other Ambulatory Visit: Payer: Self-pay

## 2020-12-14 DIAGNOSIS — M79676 Pain in unspecified toe(s): Secondary | ICD-10-CM

## 2020-12-14 DIAGNOSIS — B351 Tinea unguium: Secondary | ICD-10-CM | POA: Diagnosis not present

## 2020-12-14 NOTE — Progress Notes (Signed)
She presents today chief complaint of painful elongated toenails bilaterally.  Objective: Toenails are long thick yellow dystrophic-like mycotic pulses are palpable no open lesions or wounds.  She does have some ecchymosis to the third digit of the right foot where she states that she stubbed her toe.  No signs of infection.  The toe is in rectus position and is nontender.  Assessment: Pain in limb secondary to onychomycosis.  Plan: Debridement of toenails 1 through 5 bilateral cover service secondary to pain.

## 2021-01-06 DIAGNOSIS — R42 Dizziness and giddiness: Secondary | ICD-10-CM | POA: Diagnosis not present

## 2021-01-06 DIAGNOSIS — J330 Polyp of nasal cavity: Secondary | ICD-10-CM | POA: Diagnosis not present

## 2021-01-17 ENCOUNTER — Emergency Department: Payer: PPO

## 2021-01-17 ENCOUNTER — Inpatient Hospital Stay
Admission: EM | Admit: 2021-01-17 | Discharge: 2021-02-01 | DRG: 208 | Disposition: E | Payer: PPO | Attending: Pulmonary Disease | Admitting: Pulmonary Disease

## 2021-01-17 DIAGNOSIS — J9600 Acute respiratory failure, unspecified whether with hypoxia or hypercapnia: Secondary | ICD-10-CM | POA: Diagnosis not present

## 2021-01-17 DIAGNOSIS — R34 Anuria and oliguria: Secondary | ICD-10-CM | POA: Diagnosis not present

## 2021-01-17 DIAGNOSIS — J690 Pneumonitis due to inhalation of food and vomit: Secondary | ICD-10-CM | POA: Diagnosis not present

## 2021-01-17 DIAGNOSIS — Z8673 Personal history of transient ischemic attack (TIA), and cerebral infarction without residual deficits: Secondary | ICD-10-CM

## 2021-01-17 DIAGNOSIS — E039 Hypothyroidism, unspecified: Secondary | ICD-10-CM | POA: Diagnosis present

## 2021-01-17 DIAGNOSIS — Z853 Personal history of malignant neoplasm of breast: Secondary | ICD-10-CM

## 2021-01-17 DIAGNOSIS — I471 Supraventricular tachycardia: Secondary | ICD-10-CM | POA: Diagnosis not present

## 2021-01-17 DIAGNOSIS — J9811 Atelectasis: Secondary | ICD-10-CM | POA: Diagnosis not present

## 2021-01-17 DIAGNOSIS — R579 Shock, unspecified: Secondary | ICD-10-CM | POA: Diagnosis not present

## 2021-01-17 DIAGNOSIS — G4733 Obstructive sleep apnea (adult) (pediatric): Secondary | ICD-10-CM | POA: Diagnosis not present

## 2021-01-17 DIAGNOSIS — I3139 Other pericardial effusion (noninflammatory): Secondary | ICD-10-CM | POA: Diagnosis present

## 2021-01-17 DIAGNOSIS — E874 Mixed disorder of acid-base balance: Secondary | ICD-10-CM | POA: Diagnosis present

## 2021-01-17 DIAGNOSIS — Z538 Procedure and treatment not carried out for other reasons: Secondary | ICD-10-CM | POA: Diagnosis not present

## 2021-01-17 DIAGNOSIS — J80 Acute respiratory distress syndrome: Secondary | ICD-10-CM | POA: Diagnosis not present

## 2021-01-17 DIAGNOSIS — J9601 Acute respiratory failure with hypoxia: Secondary | ICD-10-CM | POA: Diagnosis not present

## 2021-01-17 DIAGNOSIS — K7200 Acute and subacute hepatic failure without coma: Secondary | ICD-10-CM | POA: Diagnosis not present

## 2021-01-17 DIAGNOSIS — Z7982 Long term (current) use of aspirin: Secondary | ICD-10-CM

## 2021-01-17 DIAGNOSIS — I10 Essential (primary) hypertension: Secondary | ICD-10-CM | POA: Diagnosis not present

## 2021-01-17 DIAGNOSIS — I4891 Unspecified atrial fibrillation: Secondary | ICD-10-CM | POA: Diagnosis not present

## 2021-01-17 DIAGNOSIS — E872 Acidosis, unspecified: Secondary | ICD-10-CM | POA: Diagnosis present

## 2021-01-17 DIAGNOSIS — J9690 Respiratory failure, unspecified, unspecified whether with hypoxia or hypercapnia: Secondary | ICD-10-CM | POA: Diagnosis not present

## 2021-01-17 DIAGNOSIS — E871 Hypo-osmolality and hyponatremia: Secondary | ICD-10-CM | POA: Diagnosis present

## 2021-01-17 DIAGNOSIS — K220 Achalasia of cardia: Secondary | ICD-10-CM | POA: Diagnosis not present

## 2021-01-17 DIAGNOSIS — K222 Esophageal obstruction: Secondary | ICD-10-CM | POA: Diagnosis present

## 2021-01-17 DIAGNOSIS — Z515 Encounter for palliative care: Secondary | ICD-10-CM

## 2021-01-17 DIAGNOSIS — E878 Other disorders of electrolyte and fluid balance, not elsewhere classified: Secondary | ICD-10-CM | POA: Diagnosis not present

## 2021-01-17 DIAGNOSIS — Z96652 Presence of left artificial knee joint: Secondary | ICD-10-CM | POA: Diagnosis present

## 2021-01-17 DIAGNOSIS — Z882 Allergy status to sulfonamides status: Secondary | ICD-10-CM

## 2021-01-17 DIAGNOSIS — Z8249 Family history of ischemic heart disease and other diseases of the circulatory system: Secondary | ICD-10-CM

## 2021-01-17 DIAGNOSIS — Z66 Do not resuscitate: Secondary | ICD-10-CM | POA: Diagnosis not present

## 2021-01-17 DIAGNOSIS — Z20822 Contact with and (suspected) exposure to covid-19: Secondary | ICD-10-CM | POA: Diagnosis present

## 2021-01-17 DIAGNOSIS — R1310 Dysphagia, unspecified: Secondary | ICD-10-CM | POA: Diagnosis not present

## 2021-01-17 DIAGNOSIS — I248 Other forms of acute ischemic heart disease: Secondary | ICD-10-CM | POA: Diagnosis not present

## 2021-01-17 DIAGNOSIS — R0902 Hypoxemia: Secondary | ICD-10-CM | POA: Diagnosis not present

## 2021-01-17 DIAGNOSIS — K2289 Other specified disease of esophagus: Secondary | ICD-10-CM | POA: Diagnosis present

## 2021-01-17 DIAGNOSIS — T18108A Unspecified foreign body in esophagus causing other injury, initial encounter: Secondary | ICD-10-CM | POA: Diagnosis not present

## 2021-01-17 DIAGNOSIS — R1314 Dysphagia, pharyngoesophageal phase: Secondary | ICD-10-CM | POA: Diagnosis present

## 2021-01-17 DIAGNOSIS — I25119 Atherosclerotic heart disease of native coronary artery with unspecified angina pectoris: Secondary | ICD-10-CM | POA: Diagnosis not present

## 2021-01-17 DIAGNOSIS — K219 Gastro-esophageal reflux disease without esophagitis: Secondary | ICD-10-CM | POA: Diagnosis present

## 2021-01-17 DIAGNOSIS — Z923 Personal history of irradiation: Secondary | ICD-10-CM

## 2021-01-17 DIAGNOSIS — I700 Atherosclerosis of aorta: Secondary | ICD-10-CM | POA: Diagnosis not present

## 2021-01-17 DIAGNOSIS — N179 Acute kidney failure, unspecified: Secondary | ICD-10-CM | POA: Diagnosis not present

## 2021-01-17 DIAGNOSIS — Z79899 Other long term (current) drug therapy: Secondary | ICD-10-CM

## 2021-01-17 DIAGNOSIS — R000 Tachycardia, unspecified: Secondary | ICD-10-CM | POA: Diagnosis not present

## 2021-01-17 DIAGNOSIS — R0603 Acute respiratory distress: Secondary | ICD-10-CM | POA: Diagnosis not present

## 2021-01-17 DIAGNOSIS — R069 Unspecified abnormalities of breathing: Secondary | ICD-10-CM | POA: Diagnosis not present

## 2021-01-17 DIAGNOSIS — Z4682 Encounter for fitting and adjustment of non-vascular catheter: Secondary | ICD-10-CM | POA: Diagnosis not present

## 2021-01-17 LAB — CBC
HCT: 41.7 % (ref 36.0–46.0)
Hemoglobin: 13.8 g/dL (ref 12.0–15.0)
MCH: 28.7 pg (ref 26.0–34.0)
MCHC: 33.1 g/dL (ref 30.0–36.0)
MCV: 86.7 fL (ref 80.0–100.0)
Platelets: 234 10*3/uL (ref 150–400)
RBC: 4.81 MIL/uL (ref 3.87–5.11)
RDW: 14.8 % (ref 11.5–15.5)
WBC: 21.1 10*3/uL — ABNORMAL HIGH (ref 4.0–10.5)
nRBC: 0 % (ref 0.0–0.2)

## 2021-01-17 LAB — BRAIN NATRIURETIC PEPTIDE: B Natriuretic Peptide: 67.1 pg/mL (ref 0.0–100.0)

## 2021-01-17 LAB — BLOOD GAS, VENOUS
Acid-Base Excess: 2.2 mmol/L — ABNORMAL HIGH (ref 0.0–2.0)
Bicarbonate: 27.8 mmol/L (ref 20.0–28.0)
Delivery systems: POSITIVE
FIO2: 100
O2 Saturation: 80.2 %
Patient temperature: 37
pCO2, Ven: 46 mmHg (ref 44.0–60.0)
pH, Ven: 7.39 (ref 7.250–7.430)
pO2, Ven: 45 mmHg (ref 32.0–45.0)

## 2021-01-17 LAB — COMPREHENSIVE METABOLIC PANEL
ALT: 13 U/L (ref 0–44)
AST: 16 U/L (ref 15–41)
Albumin: 4.2 g/dL (ref 3.5–5.0)
Alkaline Phosphatase: 63 U/L (ref 38–126)
Anion gap: 9 (ref 5–15)
BUN: 24 mg/dL — ABNORMAL HIGH (ref 8–23)
CO2: 24 mmol/L (ref 22–32)
Calcium: 9.2 mg/dL (ref 8.9–10.3)
Chloride: 97 mmol/L — ABNORMAL LOW (ref 98–111)
Creatinine, Ser: 0.82 mg/dL (ref 0.44–1.00)
GFR, Estimated: >60 mL/min (ref >60)
Glucose, Bld: 145 mg/dL — ABNORMAL HIGH (ref 70–99)
Potassium: 4.2 mmol/L (ref 3.5–5.1)
Sodium: 130 mmol/L — ABNORMAL LOW (ref 135–145)
Total Bilirubin: 0.9 mg/dL (ref 0.3–1.2)
Total Protein: 6.8 g/dL (ref 6.5–8.1)

## 2021-01-17 LAB — TROPONIN I (HIGH SENSITIVITY): Troponin I (High Sensitivity): 7 ng/L (ref <18)

## 2021-01-17 MED ORDER — HEPARIN SODIUM (PORCINE) 5000 UNIT/ML IJ SOLN
5000.0000 [IU] | Freq: Three times a day (TID) | INTRAMUSCULAR | Status: DC
  Administered 2021-01-18 – 2021-01-19 (×4): 5000 [IU] via SUBCUTANEOUS
  Filled 2021-01-17 (×4): qty 1

## 2021-01-17 MED ORDER — PROPOFOL 1000 MG/100ML IV EMUL
5.0000 ug/kg/min | INTRAVENOUS | Status: DC
  Administered 2021-01-17: 4.9 ug/kg/min via INTRAVENOUS
  Filled 2021-01-17: qty 100

## 2021-01-17 MED ORDER — ONDANSETRON HCL 4 MG/2ML IJ SOLN: 4.0000 mg | Freq: Four times a day (QID) | INTRAMUSCULAR | Status: DC | PRN

## 2021-01-17 MED ORDER — POLYETHYLENE GLYCOL 3350 17 G PO PACK: 17.0000 g | PACK | Freq: Every day | ORAL | Status: DC | PRN

## 2021-01-17 MED ORDER — MIDAZOLAM-SODIUM CHLORIDE 100-0.9 MG/100ML-% IV SOLN
0.5000 mg/h | INTRAVENOUS | Status: DC
  Administered 2021-01-17: 0.5 mg/h via INTRAVENOUS
  Administered 2021-01-19: 2 mg/h via INTRAVENOUS
  Filled 2021-01-17 (×2): qty 100

## 2021-01-17 MED ORDER — MORPHINE SULFATE (PF) 2 MG/ML IV SOLN
2.0000 mg | INTRAVENOUS | Status: DC | PRN
  Administered 2021-01-17: 2 mg via INTRAVENOUS
  Filled 2021-01-17: qty 1

## 2021-01-17 MED ORDER — ALBUTEROL SULFATE (2.5 MG/3ML) 0.083% IN NEBU
2.5000 mg | INHALATION_SOLUTION | Freq: Once | RESPIRATORY_TRACT | Status: AC
  Administered 2021-01-17: 2.5 mg via RESPIRATORY_TRACT
  Filled 2021-01-17: qty 3

## 2021-01-17 MED ORDER — SODIUM CHLORIDE 0.9 % IV BOLUS
500.0000 mL | Freq: Once | INTRAVENOUS | Status: AC
  Administered 2021-01-17: 500 mL via INTRAVENOUS

## 2021-01-17 MED ORDER — MIDAZOLAM BOLUS VIA INFUSION
2.0000 mg | Freq: Once | INTRAVENOUS | Status: AC
  Administered 2021-01-17: 2 mg via INTRAVENOUS

## 2021-01-17 MED ORDER — DOCUSATE SODIUM 50 MG/5ML PO LIQD
100.0000 mg | Freq: Two times a day (BID) | ORAL | Status: DC | PRN
  Filled 2021-01-17: qty 10

## 2021-01-17 MED ORDER — SUCCINYLCHOLINE CHLORIDE 200 MG/10ML IV SOSY: 100.0000 mg | PREFILLED_SYRINGE | Freq: Once | INTRAVENOUS | Status: AC

## 2021-01-17 MED ORDER — ETOMIDATE 2 MG/ML IV SOLN
15.0000 mg | Freq: Once | INTRAVENOUS | Status: AC
  Administered 2021-01-17: 15 mg via INTRAVENOUS

## 2021-01-17 MED ORDER — ETOMIDATE 2 MG/ML IV SOLN
INTRAVENOUS | Status: AC
  Administered 2021-01-17: 15 mg via INTRAVENOUS
  Filled 2021-01-17: qty 10

## 2021-01-17 MED ORDER — SUCCINYLCHOLINE CHLORIDE 200 MG/10ML IV SOSY
PREFILLED_SYRINGE | INTRAVENOUS | Status: AC
  Administered 2021-01-17: 100 mg via INTRAVENOUS
  Filled 2021-01-17: qty 10

## 2021-01-17 MED ORDER — SODIUM CHLORIDE 0.9 % IV SOLN
3.0000 g | Freq: Once | INTRAVENOUS | Status: AC
  Administered 2021-01-18: 3 g via INTRAVENOUS
  Filled 2021-01-17: qty 8

## 2021-01-17 MED ORDER — PANTOPRAZOLE SODIUM 40 MG IV SOLR
40.0000 mg | Freq: Every day | INTRAVENOUS | Status: DC
  Administered 2021-01-18 (×2): 40 mg via INTRAVENOUS
  Filled 2021-01-17 (×2): qty 40

## 2021-01-17 MED ORDER — SUCCINYLCHOLINE CHLORIDE 200 MG/10ML IV SOSY
100.0000 mg | PREFILLED_SYRINGE | Freq: Once | INTRAVENOUS | Status: AC
  Administered 2021-01-17: 100 mg via INTRAVENOUS

## 2021-01-17 MED ORDER — IPRATROPIUM-ALBUTEROL 0.5-2.5 (3) MG/3ML IN SOLN
3.0000 mL | Freq: Four times a day (QID) | RESPIRATORY_TRACT | Status: DC
  Administered 2021-01-18 – 2021-01-19 (×7): 3 mL via RESPIRATORY_TRACT
  Filled 2021-01-17 (×7): qty 3

## 2021-01-17 MED ORDER — IOHEXOL 350 MG/ML SOLN
75.0000 mL | Freq: Once | INTRAVENOUS | Status: AC | PRN
  Administered 2021-01-17: 75 mL via INTRAVENOUS

## 2021-01-17 NOTE — ED Notes (Signed)
Difficult intubation process due to food bolus an pts neck anatomy. Pt being bagged in between setting up difficult airway supplies for second intubation attempt.

## 2021-01-17 NOTE — ED Provider Notes (Signed)
[facility] Provider Note    Event Date/Time   First MD Initiated Contact with Patient 01/22/2021 2107     (approximate)   History   SOB  Level v Caveat:  resp distress  HPI  Lori Wiggins is a 88 y.o. female with a history of sleep apnea GERD presents to the ER for evaluation shortness of breath.  Patient was last day conceding greatest general with her caregiver.  Had an episode of vomiting questionable choking episode.  She called her daughter short of breath daughter to hear that she was having trouble breathing.  EMS found the patient hypoxic requiring CPAP.  She is having some chest discomfort.  Denies any abdominal pain.     Physical Exam   Triage Vital Signs: ED Triage Vitals [01/01/2021 2105]  Enc Vitals Group     BP      Pulse Rate (!) 105     Resp      Temp      Temp src      SpO2      Weight      Height      Head Circumference      Peak Flow      Pain Score      Pain Loc      Pain Edu?      Excl. in [place]?     Most recent vital signs: Vitals:   01/27/2021 2200 01/16/2021 2215  BP: (!) 172/107   Pulse: (!) 101 (!) 107  Resp: 17 18  Temp:    SpO2: 99% 100%     Constitutional: Alert  Eyes: Conjunctivae are normal.  Head: Atraumatic. Nose: No congestion/rhinnorhea. Mouth/Throat: Mucous membranes are moist.   Neck: Painless ROM.  Cardiovascular:   Good peripheral circulation. Respiratory: mild tachypnea with significant rhonchorus bs in right lung fields.  Gastrointestinal: Soft and nontender.  Musculoskeletal:  no deformity Neurologic:  MAE spontaneously. No gross focal neurologic deficits are appreciated.  Skin:  Skin is warm, dry and intact. No rash noted. Psychiatric: Mood and affect are normal. Speech and behavior are normal.    ED Results / Procedures / Treatments   Labs (all labs ordered are listed, but only abnormal results are displayed) Labs Reviewed  CBC - Abnormal; Notable for the following components:       Result Value   WBC 21.1 (*)    All other components within normal limits  COMPREHENSIVE METABOLIC PANEL - Abnormal; Notable for the following components:   Sodium 130 (*)    Chloride 97 (*)    Glucose, Bld 145 (*)    BUN 24 (*)    All other components within normal limits  BLOOD GAS, VENOUS - Abnormal; Notable for the following components:   Acid-Base Excess 2.2 (*)    All other components within normal limits  CULTURE, BLOOD (ROUTINE X 2)  CULTURE, BLOOD (ROUTINE X 2)  BRAIN NATRIURETIC PEPTIDE  URINALYSIS, COMPLETE (UACMP) WITH MICROSCOPIC  TROPONIN I (HIGH SENSITIVITY)  TROPONIN I (HIGH SENSITIVITY)     EKG  ED ECG REPORT I, Merlyn Lot, the attending physician, personally viewed and interpreted this ECG.   Date: 01/11/2021  EKG Time: 22:01  Rate: 75  Rhythm: sinus  Axis: normal  Intervals:rbbb  ST&T Change: nonspecific st abn, no stemi    RADIOLOGY Please see ED Course for my review and interpretation.  I personally reviewed all radiographic images ordered to evaluate for the above acute complaints and reviewed  radiology reports and findings.  These findings were personally discussed with the patient.  Please see medical record for radiology report.    PROCEDURES:  Critical Care performed: Yes, see critical care procedure note(s)  .Critical Care Performed by: Merlyn Lot, MD Authorized by: Merlyn Lot, MD   Critical care provider statement:    Critical care time (minutes):  35   Critical care was time spent personally by me on the following activities:  Ordering and performing treatments and interventions, ordering and review of laboratory studies, ordering and review of radiographic studies, pulse oximetry, re-evaluation of patient's condition, review of old charts, obtaining history from patient or surrogate, examination of patient, evaluation of patient's response to treatment, discussions with primary provider, discussions with  consultants and development of treatment plan with patient or surrogate Procedure Name: Intubation Date/Time: 01/10/2021 11:20 PM Performed by: Merlyn Lot, MD Pre-anesthesia Checklist: Patient identified, Emergency Drugs available, Suction available and Patient being monitored Preoxygenation: Pre-oxygenation with 100% oxygen Induction Type: IV induction and Rapid sequence Laryngoscope Size: Glidescope, Bronchoscope and 3 Grade View: Grade II Tube size: 7.0 mm Number of attempts: 4 Airway Equipment and Method: Video-laryngoscopy, Bougie stylet and Fiberoptic brochoscope Placement Confirmation: ETT inserted through vocal cords under direct vision, CO2 detector and Breath sounds checked- equal and bilateral Secured at: 23 cm Tube secured with: ETT holder Dental Injury: Teeth and Oropharynx as per pre-operative assessment     .1-3 Lead EKG Interpretation Performed by: Merlyn Lot, MD Authorized by: Merlyn Lot, MD     Interpretation: abnormal     ECG rate:  110   ECG rate assessment: tachycardic     MEDICATIONS ORDERED IN ED: Medications  Ampicillin-Sulbactam (UNASYN) 3 g in sodium chloride 0.9 % 100 mL IVPB (has no administration in time range)  morphine 2 MG/ML injection 2 mg (2 mg Intravenous Given 01/03/2021 2200)  propofol (DIPRIVAN) 1000 MG/100ML infusion (has no administration in time range)  midazolam (VERSED) 100 mg/100 mL (1 mg/mL) premix infusion (0.5 mg/hr Intravenous New Bag/Given 01/31/2021 2315)  sodium chloride 0.9 % bolus 500 mL (500 mLs Intravenous New Bag/Given 01/16/2021 2153)  albuterol (PROVENTIL) (2.5 MG/3ML) 0.083% nebulizer solution 2.5 mg (2.5 mg Nebulization Given 01/15/2021 2157)  iohexol (OMNIPAQUE) 350 MG/ML injection 75 mL (75 mLs Intravenous Contrast Given 01/04/2021 2232)  etomidate (AMIDATE) injection 15 mg (15 mg Intravenous Given 01/28/2021 2251)  succinylcholine (ANECTINE) syringe 100 mg (100 mg Intravenous Given 01/24/2021 2252)  succinylcholine  (ANECTINE) syringe 100 mg (100 mg Intravenous Given 01/14/2021 2302)     IMPRESSION / MDM / ASSESSMENT AND PLAN / ED COURSE  I reviewed the triage vital signs and the nursing notes.                              Differential diagnosis includes, but is not limited to, Asthma, copd, CHF, pna, ptx, malignancy, Pe, anemia  Presenting with shortness of breath as described above.  Does feel improved with BiPAP.  She is afebrile but given history concerning for aspiration.  She is mildly tachycardic possible PE possible sepsis or pneumonia.  The patient will be placed on continuous pulse oximetry and telemetry for monitoring.  Laboratory evaluation will be sent to evaluate for the above complaints.      Clinical Course as of 01/15/2021 2321  Tue Jan 17, 2021  2129 Chest x-ray on my review does not show evidence of pneumothorax [PR]  2207 Patient appears much more comfortable.  She not requiring significant O2 requirements.  We will see if we can de-escalate from BiPAP given concern for aspiration. [PR]  2218 Unable to tolerate off BiPAP.  Have ordered CTA. [PR]  2237 My review CT imaging she has a significantly dilated esophagus. [PR]  2315 Blood work returning from [place] patient continue described worsening shortness of breath with visualized examination to the extrinsic compression of her airway therefore proceeded with intubation due to her deterioration.  Intubation was difficult due to poor neck mobility.  There is no significant desaturation or aspiration event during intubation.  He did require insertional attempts with video laryngoscopy, bougie finally were successful with bronchoscopic intubation.  Patient sedated with propofol Versed. [PR]    Clinical Course User Index [PR] Merlyn Lot, MD     FINAL CLINICAL IMPRESSION(S) / ED DIAGNOSES   Final diagnoses:  Acute respiratory failure with hypoxia ([place])     Rx / DC Orders   ED Discharge Orders     None        Note:  This  document was prepared using Dragon voice recognition software and may include unintentional dictation errors.    Merlyn Lot, MD 01/10/2021 2322

## 2021-01-17 NOTE — H&P (Addendum)
NAME:  Lori Wiggins, MRN:  [id number], DOB:  1928/05/29, LOS: 0 ADMISSION DATE:  01/22/2021, CONSULTATION DATE:  01/10/2021 REFERRING MD: Merlyn Lot, MD CHIEF COMPLAINT:  Respiratory Failure  History of Present Illness:  Lori Wiggins is a 88 year old woman who presented to the ER via EMS after having difficulty breathing at home after eating dinner. She complained of indigestion and had episode of vomiting. Her SpO2 was in the 80s upon EMS arrival to her apartment and she was placed on CPAP for transport to the hospital.   She was initially tolerating Bipap in the ER and went for CTA of the chest and developed increasing respiratory distress concerning for stridor. She was intubated in the ER but experienced difficulty due to food matter within her upper airway.   CTA Chest shows significantly dilated esophagus throughout it's entirety with stasis of large amount of ingested food. OG tube placement was attempted in the ER but difficult due to the anatomy and food content. There is also concern for aspiration pneumonia as bilateral lower lobe opacities were noted  PCCM was called for admission due to respiratory failure requiring mechanical ventilation.   Pertinent  Medical History  OSA on CPAP Hx of Stroke Hypertension   Significant Hospital Events: Including procedures, antibiotic start and stop dates in addition to other pertinent events   1/17 admitted to the ICU, intubated  Interim History / Subjective:    Objective   Blood pressure (!) 172/107, pulse (!) 107, temperature (!) 97.4 F (36.3 C), temperature source Axillary, resp. rate 18, height _0  (1.575 m), weight 49.9 kg, SpO2 100 %.       No intake or output data in the 24 hours ending 01/24/2021 2321 Filed Weights   01/14/2021 2147  Weight: 49.9 kg    Examination: General: elderly woman, intubated, sedated, no acute distress HENT: [place]/AT, moist mucous membranes, sclera anicteric, ET tube in place Lungs: rhonchi  bilaterally Cardiovascular: rrr, no murmur Abdomen: soft, non-tender, non-distended, bowel sounds present Extremities: warm, no edema Neuro: sedated, PERRL GU: foley in place  Resolved Hospital Problem list     Assessment & Plan:  Acute Hypoxemic Respiratory Failure Aspiration Pneumonia In setting of airway compromise from dilated esophagus and aspiration - continue mechanical ventilatory support - unasyn for aspiration coverage - Propfol and PRN fentanyl pushes for sedation - PPI prophylaxis -VAP prevention protocol ordered  Severe Dilation of Esophagus Differential includes achalasia vs stricture vs obstructing mass - will try to have OG placed in order to place to suction for removal of food content - Will consult GI in the AM for further evaluation  Mild Hyponatremia Mild Hypochloremia - will provide IV fluid resuscitation  Sepsis due to aspiration Elevated WBC count, tachycardia and tachypnea - Follow up blood cultures - Continue unasyn - provided 1L of LR in ER - Will give another 1L of LR over night  Best Practice (right click and "Reselect all SmartList Selections" daily)   Diet/type: NPO DVT prophylaxis: prophylactic heparin  GI prophylaxis: PPI Lines: N/A Foley:  Yes, and it is still needed Code Status:  full code Last date of multidisciplinary goals of care discussion [n/a]  Labs   CBC: Recent Labs  Lab 01/29/2021 2121  WBC 21.1*  HGB 13.8  HCT 41.7  MCV 86.7  PLT 233    Basic Metabolic Panel: Recent Labs  Lab 01/05/2021 2121  NA 130*  K 4.2  CL 97*  CO2 24  GLUCOSE 145*  BUN  24*  CREATININE 0.82  CALCIUM 9.2   GFR: Estimated Creatinine Clearance: 35.9 mL/min (by C-G formula based on SCr of 0.82 mg/dL). Recent Labs  Lab 01/09/2021 2121  WBC 21.1*    Liver Function Tests: Recent Labs  Lab 01/20/2021 2121  AST 16  ALT 13  ALKPHOS 63  BILITOT 0.9  PROT 6.8  ALBUMIN 4.2   No results for input(s): LIPASE, AMYLASE in the last 168  hours. No results for input(s): AMMONIA in the last 168 hours.  ABG    Component Value Date/Time   HCO3 27.8 01/28/2021 2129   O2SAT 80.2 01/26/2021 2129     Coagulation Profile: No results for input(s): INR, PROTIME in the last 168 hours.  Cardiac Enzymes: No results for input(s): CKTOTAL, CKMB, CKMBINDEX, TROPONINI in the last 168 hours.  HbA1C: No results found for: HGBA1C  CBG: No results for input(s): GLUCAP in the last 168 hours.  Review of Systems:   Unable to perform as patient is intubated  Past Medical History:  She,  has a past medical history of Arthritis, Breast cancer ([place]) (2012), Equilibrium disorder, GERD (gastroesophageal reflux disease), Hypertension, Hypothyroidism, Personal history of radiation therapy, and Sleep apnea.   Surgical History:   Past Surgical History:  Procedure Laterality Date   ABDOMINAL HYSTERECTOMY     APPENDECTOMY     BOTOX INJECTION N/A 09/22/2018   Procedure: Bladder BOTOX INJECTION;  Surgeon: Hollice Espy, MD;  Location: [facility] ORS;  Service: Urology;  Laterality: N/A;  General vs MAC for anesthesia   BREAST BIOPSY Left 2012   Positive   BREAST EXCISIONAL BIOPSY Right    at age 58's   BREAST LUMPECTOMY Left 2012   F/U radiation    BREAST LUMPECTOMY WITH AXILLARY LYMPH NODE BIOPSY Left 2012   with Radiation   BREAST SURGERY Left 2012   Lumpectomy   CARDIAC CATHETERIZATION     10 years ago   CATARACT EXTRACTION W/ INTRAOCULAR LENS  IMPLANT, BILATERAL Bilateral    COLONOSCOPY     JOINT REPLACEMENT     TONSILLECTOMY     TOTAL KNEE ARTHROPLASTY Left 07/21/2012   Dr Mayer Camel   TOTAL KNEE ARTHROPLASTY Left 07/21/2012   Procedure: TOTAL KNEE ARTHROPLASTY;  Surgeon: Kerin Salen, MD;  Location: [place];  Service: Orthopedics;  Laterality: Left;     Social History:   reports that she has never smoked. She has never used smokeless tobacco. She reports current alcohol use of about 7.0 standard drinks per week. She reports that she  does not use drugs.   Family History:  Her family history includes Alzheimer's disease in her mother; Heart attack in her father. There is no history of Breast cancer.   Allergies Allergies  Allergen Reactions   Sulfa Antibiotics Other (See Comments)    Unknown Unknown       Home Medications  Prior to Admission medications   Medication Sig Start Date End Date Taking? Authorizing Provider  amLODipine (NORVASC) 2.5 MG tablet Take 2.5 mg by mouth 2 (two) times daily.  05/22/16   [provider]  amoxicillin (AMOXIL) 500 MG capsule Take by mouth. 11/02/19   [provider]  Apoaequorin (PREVAGEN) 10 MG CAPS Take by mouth.    [provider]  aspirin EC 81 MG tablet Take 81 mg by mouth daily.    [provider]  azelastine (ASTELIN) 0.1 % nasal spray  10/28/19   [provider]  Calcium-Vitamin D-Vitamin K (VIACTIV CALCIUM PLUS D  PO) Take 1 tablet by mouth daily.    [provider]  Cholecalciferol (VITAMIN D3 PO) Take 1 tablet by mouth daily.    [provider]  Coenzyme Q10 (COQ10 PO) Take 1 capsule by mouth daily.    [provider]  fluticasone Asencion Islam) 50 MCG/ACT nasal spray  11/06/18   [provider]  gentamicin ointment (GARAMYCIN) 0.1 % Apply 1 application topically 2 (two) times daily. Apply to bottom of nose for CPAP mask 01/09/16   [provider]  hydrochlorothiazide (MICROZIDE) 12.5 MG capsule Take 12.5 mg by mouth daily. 02/27/19   [provider]  LINZESS 72 MCG capsule Take 72 mcg by mouth every morning. 06/17/19   [provider]  losartan (COZAAR) 25 MG tablet Take 25 mg by mouth daily. 11/01/20   [provider]  MAGNESIUM-ZINC PO Take 1 tablet by mouth daily.    [provider]  meclizine (ANTIVERT) 25 MG tablet Take 25 mg by mouth 2 (two) times daily as needed. 07/24/19   [provider]  Menthol, Topical Analgesic, (BLUE-EMU MAXIMUM STRENGTH  EX) Apply 1 application topically 2 (two) times daily as needed (pain).    [provider]  metoprolol succinate (TOPROL-XL) 25 MG 24 hr tablet Take 25 mg by mouth daily.  05/29/16   [provider]  Misc Natural Products (JOINT HEALTH PO) Take 1 capsule by mouth daily. Peak Mobility    [provider]  omeprazole (PRILOSEC) 20 MG capsule Take 20 mg by mouth daily. 02/19/19   [provider]  vitamin B-12 (CYANOCOBALAMIN) 100 MCG tablet Take 100 mcg by mouth daily.    [provider]     Critical care time: 58 minutes    Freda Jackson, MD [place] Office: [phone]   See Amion for personal pager PCCM on call pager [phone] until 7pm. Please call Elink 7p-7a. [phone]

## 2021-01-17 NOTE — ED Notes (Signed)
Intubation successful with use of AMBU aScope 4 Broncho Regular 5.0/2.2. 25 at the lip. OG placed by MD at 55 at the lip.

## 2021-01-17 NOTE — ED Triage Notes (Signed)
Presents to the ED via [facility] from her apartment. Ate supper, c/o indigestion. Vomitted.  Called daughter with difficulty breathing. Audibly wet. Oxygen saturations in the 80's. Placed on CPAP for transport to ED.

## 2021-01-18 ENCOUNTER — Encounter: Payer: Self-pay | Admitting: Anesthesiology

## 2021-01-18 ENCOUNTER — Inpatient Hospital Stay
Admit: 2021-01-18 | Discharge: 2021-01-18 | Disposition: A | Discharge status: Home / Self Care | Payer: PPO | Attending: Pulmonary Disease | Admitting: Pulmonary Disease

## 2021-01-18 ENCOUNTER — Inpatient Hospital Stay: Payer: PPO

## 2021-01-18 ENCOUNTER — Encounter: Admission: EM | Disposition: E | Payer: Self-pay | Source: Home / Self Care | Attending: Pulmonary Disease

## 2021-01-18 DIAGNOSIS — R579 Shock, unspecified: Secondary | ICD-10-CM | POA: Diagnosis not present

## 2021-01-18 DIAGNOSIS — I4891 Unspecified atrial fibrillation: Secondary | ICD-10-CM | POA: Diagnosis not present

## 2021-01-18 HISTORY — PX: ESOPHAGOGASTRODUODENOSCOPY: SHX5428

## 2021-01-18 LAB — BLOOD GAS, ARTERIAL
Acid-base deficit: 16.4 mmol/L — ABNORMAL HIGH (ref 0.0–2.0)
Acid-base deficit: 9.1 mmol/L — ABNORMAL HIGH (ref 0.0–2.0)
Acid-base deficit: 10.5 mmol/L — ABNORMAL HIGH (ref 0.0–2.0)
Acid-base deficit: 8.4 mmol/L — ABNORMAL HIGH (ref 0.0–2.0)
Acid-base deficit: 6.9 mmol/L — ABNORMAL HIGH (ref 0.0–2.0)
Acid-base deficit: 10.9 mmol/L — ABNORMAL HIGH (ref 0.0–2.0)
Acid-base deficit: 1.7 mmol/L (ref 0.0–2.0)
Bicarbonate: 10.5 mmol/L — ABNORMAL LOW (ref 20.0–28.0)
Bicarbonate: 22.5 mmol/L (ref 20.0–28.0)
Bicarbonate: 20.6 mmol/L (ref 20.0–28.0)
Bicarbonate: 21.8 mmol/L (ref 20.0–28.0)
Bicarbonate: 21.6 mmol/L (ref 20.0–28.0)
Bicarbonate: 18.1 mmol/L — ABNORMAL LOW (ref 20.0–28.0)
Bicarbonate: 28.1 mmol/L — ABNORMAL HIGH (ref 20.0–28.0)
FIO2: 1
FIO2: 1
FIO2: 1
FIO2: 1
FIO2: 1
FIO2: 1
FIO2: 1
MECHVT: 350 mL
MECHVT: 350 mL
MECHVT: 350 mL
MECHVT: 350 mL
MECHVT: 400 mL
Mechanical Rate: 26
Mechanical Rate: 15
O2 Saturation: 55.8 %
O2 Saturation: 83.4 %
O2 Saturation: 66.8 %
O2 Saturation: 79.6 %
O2 Saturation: 82.9 %
O2 Saturation: 69.5 %
O2 Saturation: 82.6 %
PEEP: 10 cmH2O
PEEP: 10 cmH2O
PEEP: 8 cmH2O
PEEP: 10 cmH2O
PEEP: 10 cmH2O
PEEP: 12 cmH2O
PEEP: 5 cmH2O
PIP: 12 cmH2O
Patient temperature: 37
Patient temperature: 37
Patient temperature: 37
Patient temperature: 37
Patient temperature: 37
Patient temperature: 37
Patient temperature: 35.7
RATE: 18 resp/min
RATE: 18 resp/min
RATE: 26 resp/min
RATE: 26 resp/min
RATE: 15 resp/min
RATE: 26 {breaths}/min
pCO2 arterial: 28 mmHg — ABNORMAL LOW (ref 32.0–48.0)
pCO2 arterial: 76 mmHg (ref 32.0–48.0)
pCO2 arterial: 68 mmHg (ref 32.0–48.0)
pCO2 arterial: 64 mmHg — ABNORMAL HIGH (ref 32.0–48.0)
pCO2 arterial: 54 mmHg — ABNORMAL HIGH (ref 32.0–48.0)
pCO2 arterial: 52 mmHg — ABNORMAL HIGH (ref 32.0–48.0)
pCO2 arterial: 68 mmHg (ref 32.0–48.0)
pH, Arterial: 7.18 — CL (ref 7.350–7.450)
pH, Arterial: 7.08 — CL (ref 7.350–7.450)
pH, Arterial: 7.09 — CL (ref 7.350–7.450)
pH, Arterial: 7.14 — CL (ref 7.350–7.450)
pH, Arterial: 7.21 — ABNORMAL LOW (ref 7.350–7.450)
pH, Arterial: 7.15 — CL (ref 7.350–7.450)
pH, Arterial: 7.22 — ABNORMAL LOW (ref 7.350–7.450)
pO2, Arterial: 38 mmHg — CL (ref 83.0–108.0)
pO2, Arterial: 67 mmHg — ABNORMAL LOW (ref 83.0–108.0)
pO2, Arterial: 49 mmHg — ABNORMAL LOW (ref 83.0–108.0)
pO2, Arterial: 58 mmHg — ABNORMAL LOW (ref 83.0–108.0)
pO2, Arterial: 58 mmHg — ABNORMAL LOW (ref 83.0–108.0)
pO2, Arterial: 48 mmHg — ABNORMAL LOW (ref 83.0–108.0)
pO2, Arterial: 53 mmHg — ABNORMAL LOW (ref 83.0–108.0)

## 2021-01-18 LAB — CBC WITH DIFFERENTIAL/PLATELET
Abs Immature Granulocytes: 0.1 10*3/uL — ABNORMAL HIGH (ref 0.00–0.07)
Basophils Absolute: 0 10*3/uL (ref 0.0–0.1)
Basophils Relative: 0 %
Eosinophils Absolute: 0 10*3/uL (ref 0.0–0.5)
Eosinophils Relative: 0 %
HCT: 45.2 % (ref 36.0–46.0)
Hemoglobin: 14.7 g/dL (ref 12.0–15.0)
Immature Granulocytes: 1 %
Lymphocytes Relative: 13 %
Lymphs Abs: 1.7 10*3/uL (ref 0.7–4.0)
MCH: 28.7 pg (ref 26.0–34.0)
MCHC: 32.5 g/dL (ref 30.0–36.0)
MCV: 88.3 fL (ref 80.0–100.0)
Monocytes Absolute: 0.3 10*3/uL (ref 0.1–1.0)
Monocytes Relative: 2 %
Neutro Abs: 10.9 10*3/uL — ABNORMAL HIGH (ref 1.7–7.7)
Neutrophils Relative %: 84 %
Platelets: 254 10*3/uL (ref 150–400)
RBC: 5.12 MIL/uL — ABNORMAL HIGH (ref 3.87–5.11)
RDW: 14.7 % (ref 11.5–15.5)
Smear Review: NORMAL
WBC: 13.1 10*3/uL — ABNORMAL HIGH (ref 4.0–10.5)
nRBC: 0 % (ref 0.0–0.2)

## 2021-01-18 LAB — PROTIME-INR
INR: 1.4 — ABNORMAL HIGH (ref 0.8–1.2)
Prothrombin Time: 16.7 seconds — ABNORMAL HIGH (ref 11.4–15.2)

## 2021-01-18 LAB — URINALYSIS, COMPLETE (UACMP) WITH MICROSCOPIC
Bacteria, UA: NONE SEEN
Bilirubin Urine: NEGATIVE
Glucose, UA: NEGATIVE mg/dL
Ketones, ur: NEGATIVE mg/dL
Leukocytes,Ua: NEGATIVE
Nitrite: NEGATIVE
Protein, ur: NEGATIVE mg/dL
Specific Gravity, Urine: 1.025 (ref 1.005–1.030)
Squamous Epithelial / HPF: NONE SEEN (ref 0–5)
pH: 6 (ref 5.0–8.0)

## 2021-01-18 LAB — TROPONIN I (HIGH SENSITIVITY)
Troponin I (High Sensitivity): 18 ng/L — ABNORMAL HIGH (ref <18)
Troponin I (High Sensitivity): 129 ng/L (ref <18)

## 2021-01-18 LAB — GLUCOSE, CAPILLARY
Glucose-Capillary: 108 mg/dL — ABNORMAL HIGH (ref 70–99)
Glucose-Capillary: 135 mg/dL — ABNORMAL HIGH (ref 70–99)
Glucose-Capillary: 193 mg/dL — ABNORMAL HIGH (ref 70–99)
Glucose-Capillary: 162 mg/dL — ABNORMAL HIGH (ref 70–99)
Glucose-Capillary: 141 mg/dL — ABNORMAL HIGH (ref 70–99)
Glucose-Capillary: 91 mg/dL (ref 70–99)
Glucose-Capillary: 70 mg/dL (ref 70–99)

## 2021-01-18 LAB — COMPREHENSIVE METABOLIC PANEL
ALT: 15 U/L (ref 0–44)
ALT: 57 U/L — ABNORMAL HIGH (ref 0–44)
AST: 37 U/L (ref 15–41)
AST: 546 U/L — ABNORMAL HIGH (ref 15–41)
Albumin: 2.5 g/dL — ABNORMAL LOW (ref 3.5–5.0)
Albumin: 2.7 g/dL — ABNORMAL LOW (ref 3.5–5.0)
Alkaline Phosphatase: 42 U/L (ref 38–126)
Alkaline Phosphatase: 29 U/L — ABNORMAL LOW (ref 38–126)
Anion gap: 11 (ref 5–15)
Anion gap: 13 (ref 5–15)
BUN: 22 mg/dL (ref 8–23)
BUN: 22 mg/dL (ref 8–23)
CO2: 20 mmol/L — ABNORMAL LOW (ref 22–32)
CO2: 26 mmol/L (ref 22–32)
Calcium: 7.8 mg/dL — ABNORMAL LOW (ref 8.9–10.3)
Calcium: 7.2 mg/dL — ABNORMAL LOW (ref 8.9–10.3)
Chloride: 103 mmol/L (ref 98–111)
Chloride: 98 mmol/L (ref 98–111)
Creatinine, Ser: 0.89 mg/dL (ref 0.44–1.00)
Creatinine, Ser: 1.01 mg/dL — ABNORMAL HIGH (ref 0.44–1.00)
GFR, Estimated: >60 mL/min (ref >60)
GFR, Estimated: 53 mL/min — ABNORMAL LOW (ref >60)
Glucose, Bld: 167 mg/dL — ABNORMAL HIGH (ref 70–99)
Glucose, Bld: 101 mg/dL — ABNORMAL HIGH (ref 70–99)
Potassium: 2.3 mmol/L — CL (ref 3.5–5.1)
Potassium: 3.6 mmol/L (ref 3.5–5.1)
Sodium: 134 mmol/L — ABNORMAL LOW (ref 135–145)
Sodium: 137 mmol/L (ref 135–145)
Total Bilirubin: 0.6 mg/dL (ref 0.3–1.2)
Total Bilirubin: 0.4 mg/dL (ref 0.3–1.2)
Total Protein: 4.3 g/dL — ABNORMAL LOW (ref 6.5–8.1)
Total Protein: 4.3 g/dL — ABNORMAL LOW (ref 6.5–8.1)

## 2021-01-18 LAB — ECHOCARDIOGRAM LIMITED
Height: 62 in
S' Lateral: 2.14 cm
Weight: 1760.15 oz

## 2021-01-18 LAB — RESP PANEL BY RT-PCR (FLU A&B, COVID) ARPGX2
Influenza A by PCR: NEGATIVE
Influenza B by PCR: NEGATIVE
SARS Coronavirus 2 by RT PCR: NEGATIVE

## 2021-01-18 LAB — LACTIC ACID, PLASMA
Lactic Acid, Venous: 7.3 mmol/L (ref 0.5–1.9)
Lactic Acid, Venous: 6.3 mmol/L (ref 0.5–1.9)
Lactic Acid, Venous: >9 mmol/L (ref 0.5–1.9)

## 2021-01-18 LAB — BASIC METABOLIC PANEL
Anion gap: 14 (ref 5–15)
BUN: 20 mg/dL (ref 8–23)
CO2: 17 mmol/L — ABNORMAL LOW (ref 22–32)
Calcium: 7.7 mg/dL — ABNORMAL LOW (ref 8.9–10.3)
Chloride: 102 mmol/L (ref 98–111)
Creatinine, Ser: 0.79 mg/dL (ref 0.44–1.00)
GFR, Estimated: >60 mL/min (ref >60)
Glucose, Bld: 147 mg/dL — ABNORMAL HIGH (ref 70–99)
Potassium: 3.5 mmol/L (ref 3.5–5.1)
Sodium: 133 mmol/L — ABNORMAL LOW (ref 135–145)

## 2021-01-18 LAB — PROCALCITONIN: Procalcitonin: 1.11 ng/mL

## 2021-01-18 LAB — MAGNESIUM: Magnesium: 1.6 mg/dL — ABNORMAL LOW (ref 1.7–2.4)

## 2021-01-18 LAB — POTASSIUM: Potassium: 3.1 mmol/L — ABNORMAL LOW (ref 3.5–5.1)

## 2021-01-18 LAB — PHOSPHORUS: Phosphorus: 3.4 mg/dL (ref 2.5–4.6)

## 2021-01-18 LAB — CORTISOL: Cortisol, Plasma: 11.4 ug/dL

## 2021-01-18 LAB — MRSA NEXT GEN BY PCR, NASAL: MRSA by PCR Next Gen: NOT DETECTED

## 2021-01-18 LAB — LIPASE, BLOOD: Lipase: 29 U/L (ref 11–51)

## 2021-01-18 LAB — TSH: TSH: 1.697 u[IU]/mL (ref 0.350–4.500)

## 2021-01-18 SURGERY — EGD (ESOPHAGOGASTRODUODENOSCOPY)
Anesthesia: General

## 2021-01-18 MED ORDER — LACTATED RINGERS IV SOLN: INTRAVENOUS | Status: DC

## 2021-01-18 MED ORDER — STERILE WATER FOR INJECTION IV SOLN
INTRAVENOUS | Status: AC
  Filled 2021-01-18: qty 150

## 2021-01-18 MED ORDER — SODIUM CHLORIDE 0.9 % IV SOLN
3.0000 g | Freq: Four times a day (QID) | INTRAVENOUS | Status: DC
  Administered 2021-01-18 – 2021-01-19 (×5): 3 g via INTRAVENOUS
  Filled 2021-01-18 (×3): qty 3
  Filled 2021-01-18: qty 8
  Filled 2021-01-18: qty 3
  Filled 2021-01-18: qty 8
  Filled 2021-01-18: qty 3

## 2021-01-18 MED ORDER — SODIUM BICARBONATE 8.4 % IV SOLN
150.0000 meq | Freq: Once | INTRAVENOUS | Status: AC
  Administered 2021-01-18: 150 meq via INTRAVENOUS
  Filled 2021-01-18: qty 150

## 2021-01-18 MED ORDER — EPINEPHRINE 1 MG/10ML IJ SOSY
PREFILLED_SYRINGE | INTRAMUSCULAR | Status: AC
  Administered 2021-01-18: 0.5 mg
  Filled 2021-01-18: qty 10

## 2021-01-18 MED ORDER — NOREPINEPHRINE 16 MG/250ML-% IV SOLN
0.0000 ug/min | INTRAVENOUS | Status: DC
  Administered 2021-01-18: 2 ug/min via INTRAVENOUS
  Filled 2021-01-18 (×2): qty 250

## 2021-01-18 MED ORDER — POTASSIUM CHLORIDE 10 MEQ/50ML IV SOLN
10.0000 meq | INTRAVENOUS | Status: AC
  Administered 2021-01-18 (×4): 10 meq via INTRAVENOUS
  Filled 2021-01-18 (×4): qty 50

## 2021-01-18 MED ORDER — SODIUM BICARBONATE 8.4 % IV SOLN
50.0000 meq | Freq: Once | INTRAVENOUS | Status: AC
  Administered 2021-01-18: 50 meq via INTRAVENOUS
  Filled 2021-01-18: qty 50

## 2021-01-18 MED ORDER — ATROPINE SULFATE 1 MG/10ML IJ SOSY
PREFILLED_SYRINGE | INTRAMUSCULAR | Status: AC
  Administered 2021-01-18: 1 mg
  Filled 2021-01-18: qty 10

## 2021-01-18 MED ORDER — AMIODARONE HCL IN DEXTROSE 360-4.14 MG/200ML-% IV SOLN
INTRAVENOUS | Status: AC
  Administered 2021-01-18: 60 mg/h
  Filled 2021-01-18: qty 200

## 2021-01-18 MED ORDER — NOREPINEPHRINE 4 MG/250ML-% IV SOLN
INTRAVENOUS | Status: AC
  Administered 2021-01-18: 10 ug/min via INTRAVENOUS
  Filled 2021-01-18: qty 250

## 2021-01-18 MED ORDER — ALBUMIN HUMAN 5 % IV SOLN
25.0000 g | Freq: Once | INTRAVENOUS | Status: AC
  Administered 2021-01-18: 25 g via INTRAVENOUS
  Filled 2021-01-18: qty 500

## 2021-01-18 MED ORDER — ADENOSINE 12 MG/4ML IV SOLN: 6.0000 mg | Freq: Once | INTRAVENOUS | Status: AC

## 2021-01-18 MED ORDER — DIGOXIN 0.25 MG/ML IJ SOLN
0.5000 mg | Freq: Once | INTRAMUSCULAR | Status: AC
  Administered 2021-01-18: 0.5 mg via INTRAVENOUS
  Filled 2021-01-18: qty 2

## 2021-01-18 MED ORDER — AMIODARONE LOAD VIA INFUSION: 150.0000 mg | Freq: Once | INTRAVENOUS | Status: AC

## 2021-01-18 MED ORDER — MAGNESIUM SULFATE 2 GM/50ML IV SOLN
2.0000 g | Freq: Once | INTRAVENOUS | Status: AC
  Administered 2021-01-18: 2 g via INTRAVENOUS
  Filled 2021-01-18: qty 50

## 2021-01-18 MED ORDER — AMIODARONE IV BOLUS ONLY 150 MG/100ML
INTRAVENOUS | Status: AC
  Administered 2021-01-18: 150 mg
  Filled 2021-01-18: qty 100

## 2021-01-18 MED ORDER — INSULIN ASPART 100 UNIT/ML IJ SOLN
0.0000 [IU] | INTRAMUSCULAR | Status: DC
  Administered 2021-01-18: 3 [IU] via SUBCUTANEOUS
  Administered 2021-01-18: 2 [IU] via SUBCUTANEOUS
  Filled 2021-01-18 (×2): qty 1

## 2021-01-18 MED ORDER — FENTANYL 2500MCG IN NS 250ML (10MCG/ML) PREMIX INFUSION
0.0000 ug/h | INTRAVENOUS | Status: DC
  Administered 2021-01-18: 25 ug/h via INTRAVENOUS
  Filled 2021-01-18: qty 250

## 2021-01-18 MED ORDER — EPINEPHRINE 1 MG/10ML IJ SOSY: 0.5000 mg | PREFILLED_SYRINGE | Freq: Once | INTRAMUSCULAR | Status: AC

## 2021-01-18 MED ORDER — NOREPINEPHRINE 4 MG/250ML-% IV SOLN
0.0000 ug/min | INTRAVENOUS | Status: DC
  Administered 2021-01-18 (×2): 8 ug/min via INTRAVENOUS
  Filled 2021-01-18: qty 250

## 2021-01-18 MED ORDER — NOREPINEPHRINE 4 MG/250ML-% IV SOLN
INTRAVENOUS | Status: AC
  Administered 2021-01-18: 4 mg
  Filled 2021-01-18: qty 250

## 2021-01-18 MED ORDER — AMIODARONE HCL IN DEXTROSE 360-4.14 MG/200ML-% IV SOLN
60.0000 mg/h | INTRAVENOUS | Status: AC
  Administered 2021-01-18: 60 mg/h via INTRAVENOUS

## 2021-01-18 MED ORDER — PHENYLEPHRINE HCL-NACL 20-0.9 MG/250ML-% IV SOLN
0.0000 ug/min | INTRAVENOUS | Status: DC
  Administered 2021-01-18: 100 ug/min via INTRAVENOUS
  Administered 2021-01-18: 175 ug/min via INTRAVENOUS
  Administered 2021-01-18 (×3): 200 ug/min via INTRAVENOUS
  Filled 2021-01-18 (×8): qty 250

## 2021-01-18 MED ORDER — SODIUM CHLORIDE 0.9 % IV BOLUS
500.0000 mL | Freq: Once | INTRAVENOUS | Status: AC
  Administered 2021-01-18: 500 mL via INTRAVENOUS

## 2021-01-18 MED ORDER — ATROPINE SULFATE 1 MG/10ML IJ SOSY: 1.0000 mg | PREFILLED_SYRINGE | Freq: Once | INTRAMUSCULAR | Status: AC

## 2021-01-18 MED ORDER — FENTANYL CITRATE PF 50 MCG/ML IJ SOSY
25.0000 ug | PREFILLED_SYRINGE | INTRAMUSCULAR | Status: DC | PRN
  Administered 2021-01-18: 25 ug via INTRAVENOUS
  Filled 2021-01-18: qty 1

## 2021-01-18 MED ORDER — CHLORHEXIDINE GLUCONATE 0.12% ORAL RINSE (MEDLINE KIT)
15.0000 mL | Freq: Two times a day (BID) | OROMUCOSAL | Status: DC
  Administered 2021-01-18 – 2021-01-19 (×3): 15 mL via OROMUCOSAL

## 2021-01-18 MED ORDER — FENTANYL CITRATE PF 50 MCG/ML IJ SOSY
25.0000 ug | PREFILLED_SYRINGE | INTRAMUSCULAR | Status: DC | PRN
  Administered 2021-01-18 – 2021-01-19 (×2): 50 ug via INTRAVENOUS
  Filled 2021-01-18: qty 2

## 2021-01-18 MED ORDER — METHYLPREDNISOLONE SODIUM SUCC 40 MG IJ SOLR
40.0000 mg | Freq: Two times a day (BID) | INTRAMUSCULAR | Status: DC
  Administered 2021-01-18 – 2021-01-19 (×3): 40 mg via INTRAVENOUS
  Filled 2021-01-18 (×3): qty 1

## 2021-01-18 MED ORDER — ROCURONIUM BROMIDE 10 MG/ML (PF) SYRINGE
PREFILLED_SYRINGE | INTRAVENOUS | Status: AC
  Administered 2021-01-18: 50 mg
  Filled 2021-01-18: qty 10

## 2021-01-18 MED ORDER — DIGOXIN 0.25 MG/ML IJ SOLN: 0.2500 mg | Freq: Once | INTRAMUSCULAR | Status: DC

## 2021-01-18 MED ORDER — ORAL CARE MOUTH RINSE
15.0000 mL | OROMUCOSAL | Status: DC
  Administered 2021-01-18 – 2021-01-19 (×13): 15 mL via OROMUCOSAL

## 2021-01-18 MED ORDER — NOREPINEPHRINE 4 MG/250ML-% IV SOLN: 0.0000 ug/min | INTRAVENOUS | Status: DC

## 2021-01-18 MED ORDER — CHLORHEXIDINE GLUCONATE CLOTH 2 % EX PADS
6.0000 | MEDICATED_PAD | Freq: Every day | CUTANEOUS | Status: DC
  Administered 2021-01-18: 6 via TOPICAL
  Filled 2021-01-18: qty 6

## 2021-01-18 MED ORDER — VASOPRESSIN 20 UNITS/100 ML INFUSION FOR SHOCK
0.0000 [IU]/min | INTRAVENOUS | Status: DC
  Administered 2021-01-18 – 2021-01-19 (×4): 0.03 [IU]/min via INTRAVENOUS
  Filled 2021-01-18 (×4): qty 100

## 2021-01-18 MED ORDER — AMIODARONE LOAD VIA INFUSION
75.0000 mg | Freq: Once | INTRAVENOUS | Status: DC
  Filled 2021-01-18: qty 41.67

## 2021-01-18 MED ORDER — AMIODARONE HCL IN DEXTROSE 360-4.14 MG/200ML-% IV SOLN
30.0000 mg/h | INTRAVENOUS | Status: DC
  Administered 2021-01-18 – 2021-01-19 (×3): 30 mg/h via INTRAVENOUS
  Filled 2021-01-18 (×3): qty 200

## 2021-01-18 MED ORDER — PHENYLEPHRINE HCL-NACL 20-0.9 MG/250ML-% IV SOLN: 25.0000 ug/min | INTRAVENOUS | Status: DC

## 2021-01-18 MED ORDER — PHENYLEPHRINE CONCENTRATED 100MG/250ML (0.4 MG/ML) INFUSION SIMPLE
0.0000 ug/min | INTRAVENOUS | Status: DC
  Administered 2021-01-18: 200 ug/min via INTRAVENOUS
  Filled 2021-01-18 (×2): qty 250

## 2021-01-18 MED ORDER — ADENOSINE 12 MG/4ML IV SOLN
INTRAVENOUS | Status: AC
  Administered 2021-01-18: 6 mg
  Filled 2021-01-18: qty 4

## 2021-01-18 MED ORDER — DIGOXIN 0.25 MG/ML IJ SOLN
0.5000 mg | Freq: Once | INTRAMUSCULAR | Status: DC
Start: 2021-01-18 — End: 2021-01-18

## 2021-01-18 MED ORDER — SODIUM CHLORIDE 0.9 % IV SOLN
250.0000 mL | INTRAVENOUS | Status: DC
  Administered 2021-01-18: 250 mL via INTRAVENOUS

## 2021-01-18 MED ORDER — ROCURONIUM BROMIDE 50 MG/5ML IV SOLN
50.0000 mg | Freq: Once | INTRAVENOUS | Status: AC
  Filled 2021-01-18: qty 5

## 2021-01-18 MED ORDER — STERILE WATER FOR INJECTION IV SOLN
INTRAVENOUS | Status: AC
  Filled 2021-01-18: qty 150
  Filled 2021-01-18: qty 1000

## 2021-01-18 MED ORDER — SODIUM BICARBONATE 8.4 % IV SOLN
INTRAVENOUS | Status: AC
  Filled 2021-01-18: qty 50

## 2021-01-18 NOTE — TOC Initial Note (Signed)
Transition of Care [facility]) - Initial/Assessment Note    Patient Details  Name: Lori Wiggins MRN: [id number] Date of Birth: June 15, 1928  Transition of Care [facility]) CM/SW Contact:    Shelbie Hutching, RN Phone Number: 01/29/2021, 11:17 AM  Clinical Narrative:                 Patient admitted to the hospital with acute respiratory failure after vomiting at home, difficult intubation in the emergency room and food bolus found in patient's throat. Patient is currently in the ICU intubated and sedated, family is at the bedside.    TOC will follow patient through hospitalization.  Family is making decisions reguarding care, there is no Advanced directive on file.    Expected Discharge Plan:  (undetermined) Barriers to Discharge: Continued Medical Work up   Patient Goals and CMS Choice Patient states their goals for this hospitalization and ongoing recovery are:: Patient intubated and sedated      Expected Discharge Plan and Services Expected Discharge Plan:  (undetermined)   Discharge Planning Services: CM Consult   Living arrangements for the past 2 months: Apartment                 DME Arranged: N/A DME Agency: NA       HH Arranged: NA [place] Agency: NA        Prior Living Arrangements/Services Living arrangements for the past 2 months: Apartment Lives with:: Spouse Patient language and need for interpreter reviewed:: Yes Do you feel safe going back to the place where you live?: Yes      Need for Family Participation in Patient Care: Yes (Comment) (aspiration) Care giver support system in place?: Yes (comment) (husband, children)   Criminal Activity/Legal Involvement Pertinent to Current Situation/Hospitalization: No - Comment as needed  Activities of Daily Living      Permission Sought/Granted                  Emotional Assessment Appearance:: Appears stated age Attitude/Demeanor/Rapport: Intubated (Following Commands or Not Following Commands),  Sedated Affect (typically observed): Unable to Assess   Alcohol / Substance Use: Not Applicable Psych Involvement: No (comment)  Admission diagnosis:  Acute respiratory failure (HCC) [J96.00] Acute respiratory failure with hypoxia ([place]) [J96.01] Patient Active Problem List   Diagnosis Date Noted   Acute respiratory failure ([place]) 01/28/2021   Ataxia 02/04/2017   Sinus bradycardia 02/04/2017   Dizziness 01/21/2017   History of stroke 01/21/2017   Constipation 08/04/2012   Unspecified hypothyroidism 07/31/2012   Obstructive sleep apnea 07/31/2012   E. coli UTI (urinary tract infection), fluoroquinolone resistant 07/26/2012   Essential hypertension, benign 07/26/2012   Ileus, postoperative ([place]) 07/24/2012   Hyponatremia 07/24/2012   Arthritis of knee, left 07/21/2012   PCP:  Lorelee Market, MD Pharmacy:   [place], [place] - [address] [place] [address] Phone: [phone] Fax: [phone]  [facility] [place], [place] - [address] [place] [address] Phone: [phone] Fax: [phone]     Social Determinants of Health (SDOH) Interventions    Readmission Risk Interventions No flowsheet data found.

## 2021-01-18 NOTE — Progress Notes (Signed)
[facility] PCCM  Discussed labs with family : multi-organ failure including shock liver, AKI and severe lactic acidosis with demand cardiac ischemia. I explained to her grandson Mikeal Hawthorne it is very unlikely she will survive this.  Will continue current level of care but I don't anticipate she will survive > 24 hours.  I explained to Mikeal Hawthorne his family needs to be ready to discuss withdrawal of care tomorrow if she has not significantly improved.  Roselie Awkward, MD [place] PCCM Pager: [phone] Cell: [phone] After 7:00 pm call Elink  [phone]

## 2021-01-18 NOTE — Progress Notes (Signed)
PCCM Update:  Labs: Lactic acid 7, K 2.3, serum bicarb 20 ABG: pH 7.09, pCO2 68, pO2 49 (Vent settings FiO2 1.0, RR 26, PEEP 8, Vt 350)  She continues to have refractory hypoxemia and poor ventilation. Chest radiograph with increasing bilateral infiltrates concerning for progressive pneumonia vs pulmonary edema.   Potassium replacement has been ordered along with magnesium. We are limited to IV only given no oral access.  I would like to give lasix, but given potassium level and shock, will hold off at this time.   Started 40mg  BID for aspiration pneumonitis.   Overall, prognosis is poor at this time. Will update family later this morning.   Freda Jackson, MD [facility] Office: [phone]   See Amion for personal pager PCCM on call pager [phone] until 7pm. Please call Elink 7p-7a. [phone]

## 2021-01-18 NOTE — Consult Note (Signed)
GI Inpatient Consult Note  Reason for Consult: Esophageal dysphagia, esophageal dilation, aspiration pneumonia    Attending Requesting Consult: Dr. Freda Jackson  History of Present Illness: Lori Wiggins is a 88 y.o. female seen for evaluation of esophageal dilation, esophageal dysphagia, and aspiration pneumonia at the request of intensivist - Dr. Freda Jackson. Pt has a PMH of HTN, Hx of breast cancer, hypothyroidism, OSA on CPAP, and Hx of stroke. She presented to the [facility]  ED via EMS yesterday evening for chief complaint of shortness of breath. She lives alone at [place] with her husband. She was eating dinner yesterday evening and complained of severe indigestion and questionable choking episode along with one episode of non-bloody and non-bilious emesis. She called her daughter short of breath and EMS was called. She initially tolerated BiPAP in the ED. CTA performed showed no evidence of PE, but did comment on diffuse dilatation of esophagus with stasis of large amount of ingested food concerning for potential achalasia or stricture versus mass lesion at level of GEJ. She became increasingly hypoxemic and respiratory distress concerning for stridor. She was intubated and sedated to protect her airway. She was also diagnosed with aspiration pneumonia and placed on Unasyn. GI consulted for further evaluation and management.    Past Medical History:  Past Medical History:  Diagnosis Date   Arthritis    Breast cancer ([place]) 2012   left  breast   Equilibrium disorder    GERD (gastroesophageal reflux disease)    Hypertension    Hypothyroidism    Personal history of radiation therapy    Sleep apnea    uses CPAP    Problem List: Patient Active Problem List   Diagnosis Date Noted   Acute respiratory failure ([place]) 01/27/2021   Ataxia 02/04/2017   Sinus bradycardia 02/04/2017   Dizziness 01/21/2017   History of stroke 01/21/2017   Constipation 08/04/2012   Unspecified  hypothyroidism 07/31/2012   Obstructive sleep apnea 07/31/2012   E. coli UTI (urinary tract infection), fluoroquinolone resistant 07/26/2012   Essential hypertension, benign 07/26/2012   Ileus, postoperative ([place]) 07/24/2012   Hyponatremia 07/24/2012   Arthritis of knee, left 07/21/2012    Past Surgical History: Past Surgical History:  Procedure Laterality Date   ABDOMINAL HYSTERECTOMY     APPENDECTOMY     BOTOX INJECTION N/A 09/22/2018   Procedure: Bladder BOTOX INJECTION;  Surgeon: Hollice Espy, MD;  Location: [facility] ORS;  Service: Urology;  Laterality: N/A;  General vs MAC for anesthesia   BREAST BIOPSY Left 2012   Positive   BREAST EXCISIONAL BIOPSY Right    at age 21's   BREAST LUMPECTOMY Left 2012   F/U radiation    BREAST LUMPECTOMY WITH AXILLARY LYMPH NODE BIOPSY Left 2012   with Radiation   BREAST SURGERY Left 2012   Lumpectomy   CARDIAC CATHETERIZATION     10 years ago   CATARACT EXTRACTION W/ INTRAOCULAR LENS  IMPLANT, BILATERAL Bilateral    COLONOSCOPY     JOINT REPLACEMENT     TONSILLECTOMY     TOTAL KNEE ARTHROPLASTY Left 07/21/2012   Dr Mayer Camel   TOTAL KNEE ARTHROPLASTY Left 07/21/2012   Procedure: TOTAL KNEE ARTHROPLASTY;  Surgeon: Kerin Salen, MD;  Location: [place];  Service: Orthopedics;  Laterality: Left;    Allergies: Allergies  Allergen Reactions   Sulfa Antibiotics Other (See Comments)    Unknown Unknown      Home Medications: Medications Prior to Admission  Medication Sig Dispense Refill  Last Dose   amLODipine (NORVASC) 2.5 MG tablet Take 2.5 mg by mouth 2 (two) times daily.       losartan (COZAAR) 25 MG tablet Take 25 mg by mouth daily.      Apoaequorin (PREVAGEN) 10 MG CAPS Take by mouth.      aspirin EC 81 MG tablet Take 81 mg by mouth daily.      azelastine (ASTELIN) 0.1 % nasal spray       Calcium-Vitamin D-Vitamin K (VIACTIV CALCIUM PLUS D PO) Take 1 tablet by mouth daily.      Cholecalciferol (VITAMIN D3 PO) Take 1 tablet by mouth  daily.      Coenzyme Q10 (COQ10 PO) Take 1 capsule by mouth daily.      fluticasone (FLONASE) 50 MCG/ACT nasal spray       gentamicin ointment (GARAMYCIN) 0.1 % Apply 1 application topically 2 (two) times daily. Apply to bottom of nose for CPAP mask      hydrochlorothiazide (MICROZIDE) 12.5 MG capsule Take 12.5 mg by mouth daily.      LINZESS 72 MCG capsule Take 72 mcg by mouth every morning.      MAGNESIUM-ZINC PO Take 1 tablet by mouth daily.      meclizine (ANTIVERT) 25 MG tablet Take 25 mg by mouth 2 (two) times daily as needed.      Menthol, Topical Analgesic, (BLUE-EMU MAXIMUM STRENGTH EX) Apply 1 application topically 2 (two) times daily as needed (pain).      metoprolol succinate (TOPROL-XL) 25 MG 24 hr tablet Take 25 mg by mouth daily.       Misc Natural Products (JOINT HEALTH PO) Take 1 capsule by mouth daily. Peak Mobility      omeprazole (PRILOSEC) 20 MG capsule Take 20 mg by mouth daily.      vitamin B-12 (CYANOCOBALAMIN) 100 MCG tablet Take 100 mcg by mouth daily.      Home medication reconciliation was completed with the patient.   Scheduled Inpatient Medications:    chlorhexidine gluconate (MEDLINE KIT)  15 mL Mouth Rinse BID   Chlorhexidine Gluconate Cloth  6 each Topical Q0600   heparin  5,000 Units Subcutaneous Q8H   insulin aspart  0-15 Units Subcutaneous Q4H   ipratropium-albuterol  3 mL Nebulization Q6H   mouth rinse  15 mL Mouth Rinse 10 times per day   methylPREDNISolone (SOLU-MEDROL) injection  40 mg Intravenous Q12H   pantoprazole (PROTONIX) IV  40 mg Intravenous QHS    Continuous Inpatient Infusions:    sodium chloride 10 mL/hr at 01/04/2021 1216   amiodarone 30 mg/hr (01/04/2021 1216)   ampicillin-sulbactam (UNASYN) IV Stopped (01/10/2021 1212)   midazolam 2 mg/hr (01/08/2021 2339)   phenylephrine (NEO-SYNEPHRINE) Adult infusion 175 mcg/min (01/27/2021 1338)   potassium chloride 10 mEq (01/23/2021 1427)    sodium bicarbonate (isotonic) infusion in sterile water 100  mL/hr at 01/22/2021 1216   vasopressin 0.03 Units/min (01/28/2021 1306)    PRN Inpatient Medications:  docusate, fentaNYL (SUBLIMAZE) injection, fentaNYL (SUBLIMAZE) injection, morphine injection, ondansetron (ZOFRAN) IV, polyethylene glycol  Family History: family history includes Alzheimer's disease in her mother; Heart attack in her father.  The patient's family history is negative for inflammatory bowel disorders, GI malignancy, or solid organ transplantation.  Social History:   reports that she has never smoked. She has never used smokeless tobacco. She reports current alcohol use of about 7.0 standard drinks per week. She reports that she does not use drugs. The patient denies ETOH, tobacco, or  drug use.   Review of Systems:  Intubated and sedated. Unable to obtain due to critical illness.    Physical Examination: BP (!) 70/43    Pulse 78    Temp (!) 97.5 F (36.4 C)    Resp (!) 36    Ht 5' 2.01" (1.575 m)    Wt 49.9 kg    SpO2 95%    BMI 20.12 kg/m  Gen: Elderly female, intubated and sedated on mechanical ventilation, no acute distress HEENT: PEERLA, EOMI, +ET tube in place Neck: supple, no JVD or thyromegaly Chest: CTA bilaterally, no wheezes, crackles, or other adventitious sounds CV: RRR, no m/g/c/r Abd: soft, NT, ND, +BS in all four quadrants; no HSM, guarding, ridigity, or rebound tenderness Ext: no edema, well perfused with 2+ pulses, Skin: no rash or lesions noted Lymph: no LAD  Data: Lab Results  Component Value Date   WBC 13.1 (H) 01/08/2021   HGB 14.7 01/06/2021   HCT 45.2 01/26/2021   MCV 88.3 01/04/2021   PLT 254 01/22/2021   Recent Labs  Lab 01/29/2021 2121 01/03/2021 0418  HGB 13.8 14.7   Lab Results  Component Value Date   NA 134 (L) 01/03/2021   K 3.1 (L) 01/23/2021   CL 103 01/30/2021   CO2 20 (L) 01/24/2021   BUN 22 01/11/2021   CREATININE 0.89 01/30/2021   Lab Results  Component Value Date   ALT 15 01/29/2021   AST 37 01/04/2021   ALKPHOS  42 01/05/2021   BILITOT 0.6 01/07/2021   Recent Labs  Lab 01/04/2021 0146  INR 1.4*   CTA 01/11/2021: IMPRESSION: 1. No CT evidence of central pulmonary artery embolus. 2. Diffuse dilatation of the esophagus with stasis of large amount of ingested food. Findings may represent achalasia or narrowing/stricture versus obstructing mass at the level of the gastroesophageal junction. GI referral and further evaluation with endoscopy or upper GI study on an nonemergent/outpatient basis recommended. 3. Bilateral lower lobe patchy and streaky densities may represent atelectasis or pneumonia. Aspiration is not excluded. 4. Aortic Atherosclerosis (ICD10-I70.0).  Assessment/Plan:  88 y/o Caucasian female with a PMH of HTN, OSA on CPAP, and Hx of stroke presented to the [facility] ED for chief complaint of shortness of breath, esophageal dysphagia, and severe indigestion after eating dinner found to have acute hypoxemic respiratory failure, aspiration pneumonia, and severe dilation of esophagus  Severe dilation of esophagus - likely secondary to underlying esophageal motility disorder such as achalasia with food impaction. Other DDx includes esophageal stricture, obstructing mass lesion at GEJ, diffuse esophageal spasm, Zenker's diverticulum, etc  Acute hypoxemic respiratory failure - in setting of airway compromise from aspiration pneumonia and esophageal food obstruction. Intubated and sedated.   Sepsis - secondary to #2  Electrolyte derangement - repleted   Recommendations:  - Reviewed findings of severe dilation of esophagus with patient's family in ICU waiting area with daughter Lanna Poche), son Shon Baton), daughter-in-law, and grandaughter - Clinical presentation c/w underlying esophageal motility disorder such as achalasia along with impacted food - Advise bedside endoscopy for luminal evaluation and potential removal of food bolus +/- esophageal dilatation. Discussed procedure details and  indications with patient's family and they consent to proceed - Strict NPO status - Agree with Unasyn for coverage of aspiration pneumonia  - EGD this afternoon with Dr. Alice Reichert in ICU bedside. See procedure note for findings and further recommendations  I reviewed the risks (including bleeding, perforation, infection, anesthesia complications, cardiac/respiratory complications), benefits and alternatives of EGD. Patient's  family consents to proceed.    Thank you for the consult. Please call with questions or concerns.  [facility] [phone] (Cell)

## 2021-01-18 NOTE — Op Note (Signed)
[facility] Gastroenterology Patient Name: Lori Wiggins Procedure Date: 01/28/2021 3:06 PM MRN: [id number] Account #: [id number] Date of Birth: 01-29-1928 Admit Type: Inpatient Age: 88 Room: [facility] ENDO ROOM 2 Gender: Female Note Status: Finalized Instrument Name: Upper Endoscope [id number] Procedure:             Upper GI endoscopy Indications:           Removal of foreign body in the esophagus, Suspected                         stenosis of the esophagus, Abnormal CT of the GI tract Providers:             Benay Pike. Kahla Risdon MD, MD Medicines:             per Intensive care physician and staff, Midazolam                            mg IV Complications:         Hypotension Procedure:             Pre-Anesthesia Assessment:                        - The risks and benefits of the procedure and the                         sedation options and risks were discussed with the                         patient. All questions were answered and informed                         consent was obtained.                        - Patient identification and proposed procedure were                         verified prior to the procedure by the nurse. The                         procedure was verified ICU Bed.                        After obtaining informed consent, the endoscope was                         passed under direct vision. Throughout the procedure,                         the patient's blood pressure, pulse, and oxygen                         saturations were monitored continuously. The Endoscope                         was introduced through the mouth, with the intention                         of advancing to  the duodenum. The scope was advanced                         to the third part of the duodenum before the procedure                         was aborted. Medications were given. The upper GI                         endoscopy was technically difficult and complex due to                          presence of food. Successful completion of the                         procedure was aided by performing the maneuvers                         documented (below) in this report. The patient                         tolerated the procedure poorly due to the patient's                         cardiovascular instability (hypotension). The                         procedure was aborted due to the difficulty of the                         procedure. Receiving assistance from additional staff                         did not allow for the successful completion of the                         procedure. Findings:      The larynx was normal.      Food was found in the entire esophagus. Scope was navigated around food       into stomach.      Diffuse atrophic mucosa was found in the entire examined stomach.      The examined duodenum was normal.      Food was found in the entire esophagus. Food and fluid were partially       cleared using coin grabbers, roth net to remove via the mouth. Some food       was pushed into the stomach. Procedure aborted due to hypotension. Scope       was withdrawn. Esopahgus incompletely cleared of food. Estimated blood       loss: none.      One benign-appearing, intrinsic moderate stenosis was found at the       gastroesophageal junction. This stenosis measured 1.3 cm (inner       diameter) x less than one cm (in length). The stenosis was traversed. Impression:            - The procedure was aborted due to the difficulty of  the procedure.                        - Normal larynx.                        - Food was found in the esophagus.                        - Gastric mucosal atrophy.                        - Normal examined duodenum.                        - Food in the esophagus.                        - Benign-appearing esophageal stenosis. Not amenable                         to dilation given cardiovascular instability.                         - No specimens collected. Recommendation:        - Continue treatment per ICU team.                        Findings and limitations of the procedure discussed                         with patient's daughter Vernell Morgans).                        Will continue to follow. Procedure Code(s):     --- Professional ---                        [phone], 53, Esophagogastroduodenoscopy, flexible,                         transoral; diagnostic, including collection of                         specimen(s) by brushing or washing, when performed                         (separate procedure) Diagnosis Code(s):     --- Professional ---                        R93.3, Abnormal findings on diagnostic imaging of                         other parts of digestive tract                        T18.108A, Unspecified foreign body in esophagus                         causing other injury, initial encounter                        K22.2, Esophageal obstruction  K31.89, Other diseases of stomach and duodenum                        T18.128A, Food in esophagus causing other injury,                         initial encounter CPT copyright 2019 American Medical Association. All rights reserved. The codes documented in this report are preliminary and upon coder review may  be revised to meet current compliance requirements. Efrain Sella MD, MD 01/16/2021 4:17:41 PM This report has been signed electronically. Number of Addenda: 0 Note Initiated On: 01/12/2021 3:06 PM Estimated Blood Loss:  Estimated blood loss: none.      [facility]

## 2021-01-18 NOTE — ED Notes (Signed)
Transferred patient to RM 17 in ICU. Intubated. Sedated with propofol and Versed. Rass of -2 to -3. Vent Synchrony. Noted patient to be intermittently chewing on ETT. Sinus Rhythm. Soft SBP. ED MD aware. Receiving second NS bolus of 500 ml. Foley catheter patent. Report and cares to [facility] ICU RN.

## 2021-01-18 NOTE — Progress Notes (Signed)
Prayer for patient. Spiritual and emotional support for family

## 2021-01-18 NOTE — Plan of Care (Signed)

## 2021-01-18 NOTE — Consult Note (Signed)
[place] for Electrolyte Monitoring and Replacement   Recent Labs: Potassium (mmol/L)  Date Value  01/11/2021 2.3 (LL)  11/04/2013 4.0   Magnesium (mg/dL)  Date Value  01/22/2021 1.6 (L)   Calcium (mg/dL)  Date Value  01/27/2021 7.8 (L)   Calcium, Total (mg/dL)  Date Value  11/04/2013 9.2   Albumin (g/dL)  Date Value  01/23/2021 2.5 (L)  11/04/2013 3.6   Phosphorus (mg/dL)  Date Value  01/20/2021 3.4   Sodium (mmol/L)  Date Value  01/23/2021 134 (L)  11/04/2013 134 (L)   Assessment: 88 year old female with a past medical history of OSA on CPAP, history of stroke, and hypertension BIB EMS after having difficulty breathing at home after eating. CTA chest concerning for bilateral aspiration pneumonia. Patient is currently intubated and sedated. Pharmacy is asked to follow and replace electrolytes while in the CCU.   MIVF: Isotonic bicarbonate gtt 100cc/hr  Goal of Therapy:  Electrolytes within normal limits  Plan:  -- Potassium chloride 67mEq IV x 4 -- Magnesium sulfate 2g IV x 1 -- Recheck potassium after completion of IV runs  Owens Loffler ,PharmD Candidate 01/23/2021 7:12 AM

## 2021-01-18 NOTE — Progress Notes (Signed)
[facility] PCCM  88 y/o female admitted last night with aspiration pneumonia in the setting of a dilated esophagus/achalasia vs obstruction picture.  Decompensated overnight with worsening shock, had an EGD today when food was removed from her esophagus, a benign appearing esophageal stenosis was noted, but the procedure was aborted due to cardiac instability. The patient had a near arrest, worsening shock and hypoxemia later in the evening.  Her ventilator was changed to APRV mode and now her oxygenation has improved modestly.    On exam she is cyanotic, mildly labored breathing, on multiple vasopressors.  Labs reviewed, no recent BMET or ABG  She has been oliguric today  CXR today images reviewed by me: there are bilateral pulmonary infiltrates, ett in place, significantly worse than the prior film  Impression: ARDS Aspiration pneumonia Septic shock Lactic acidosis Severe respiratory and metabolic acidosis due to all of the above AKI Esophageal stenosis with food impaction  Plan: Continue PAD protocol, adjust RASS target to -3, add fentanyl infusion Repeat ABG at 2100 Check CMET, trop, CBC, lactic acid If no improvement in oxygenation by 2100 will add neuromuscular blockade, change vent to [facility] and place in prone position  I updated the family by phone this evening  My cc time 60 minutes  Roselie Awkward, MD [place] PCCM Pager: [phone] Cell: [phone] After 7:00 pm call Elink  [phone]

## 2021-01-18 NOTE — Procedures (Signed)
Arterial Catheter Insertion Procedure Note  LATIMA HAMZA  [id number]  1928-10-23  Date:01/06/2021  Time:3:56 AM    Provider Performing: Freddi Starr    Procedure: Insertion of Arterial Line [phone]) with US guidance ([id number])   Indication(s) Blood pressure monitoring and/or need for frequent ABGs  Consent Risks of the procedure as well as the alternatives and risks of each were explained to the patient and/or caregiver.  Consent for the procedure was obtained and is signed in the bedside chart  Anesthesia None   Time Out Verified patient identification, verified procedure, site/side was marked, verified correct patient position, special equipment/implants available, medications/allergies/relevant history reviewed, required imaging and test results available.   Sterile Technique Maximal sterile technique including full sterile barrier drape, hand hygiene, sterile gown, sterile gloves, mask, hair covering, sterile ultrasound probe cover (if used).   Procedure Description Area of catheter insertion was cleaned with chlorhexidine and draped in sterile fashion. With real-time ultrasound guidance an arterial catheter was placed into the right femoral artery.  Appropriate arterial tracings confirmed on monitor.     Complications/Tolerance None; patient tolerated the procedure well.   EBL Minimal   Specimen(s) None

## 2021-01-18 NOTE — ED Notes (Signed)
While at CT pt started to cough up phlegm. Stating that she was having difficult time breathing. Stridor noted. Returned to Rm 24 in ED. Notified MD of patient having difficulty time breathing.

## 2021-01-18 NOTE — Procedures (Signed)
Central Venous Catheter Insertion Procedure Note  SEDRA MORFIN  [id number]  1928-08-27  Date:01/16/2021  Time:3:55 AM   Provider Performing:Elonda Giuliano B Venise [place]   Procedure: Insertion of Non-tunneled Central Venous [phone]) with US guidance ([id number])   Indication(s) Medication administration  Consent Risks of the procedure as well as the alternatives and risks of each were explained to the patient and/or caregiver.  Consent for the procedure was obtained and is signed in the bedside chart  Anesthesia Topical only with 1% lidocaine   Timeout Verified patient identification, verified procedure, site/side was marked, verified correct patient position, special equipment/implants available, medications/allergies/relevant history reviewed, required imaging and test results available.  Sterile Technique Maximal sterile technique including full sterile barrier drape, hand hygiene, sterile gown, sterile gloves, mask, hair covering, sterile ultrasound probe cover (if used).  Procedure Description Area of catheter insertion was cleaned with chlorhexidine and draped in sterile fashion.  With real-time ultrasound guidance a central venous catheter was placed into the right femoral vein. Nonpulsatile blood flow and easy flushing noted in all ports.  The catheter was sutured in place and sterile dressing applied.  Complications/Tolerance None; patient tolerated the procedure well. Chest X-ray is ordered to verify placement for internal jugular or subclavian cannulation.   Chest x-ray is not ordered for femoral cannulation.  EBL Minimal  Specimen(s) None

## 2021-01-18 NOTE — Progress Notes (Addendum)
PCCM Update:  Called to the bedside due to patient become hypoxemic and hypotensive. It was initially thought secondary to sedation bolus, but her oxygenation, heart rate and blood pressure did not improve over time with a fluid bolus. Levophed was then started peripherally. Bedside US showed small pericardial effusion and poor filling of the RV and LV. No pneumothorax noted on bedside US and bilateral lung sounds noted. ABG showed pH of 7.1 so amp of bicarb was given. EKG showed SVT so 6mg  of adenosine was given which revealed atrial fibrillation with RVR. Amiodarone load was then given. She received 1L of LR throughout this time and a second liter was ordered.   I spoke with daughter at the bedside and we discussed code status and resuscitative care. She understood that chest compressions would do more harm than good at this time and lead to a prolonged recovery with potential for significant change in her baseline status. It was decided to make patient a DNR but continue aggressive measures up until that point of CPR. Patient's daughter agreed to central and arterial line placements.    50 minutes critical care time  Freda Jackson, MD [place] Office: [phone]   See Amion for personal pager PCCM on call pager [phone] until 7pm. Please call Elink 7p-7a. [phone]

## 2021-01-18 NOTE — Progress Notes (Signed)
[facility] Progress Note Patient Name: Lori Wiggins DOB: [birth year] MRN: [id number]   Date of Service  01/01/2021  HPI/Events of Note  Patient admitted with acute hypoxemic respiratory failure requiring intubation and mechanical ventilation, etiology appears to be aspiration pneumonia due to esophageal dysmotility with large amounts of undigested food in the esophagus.  eICU Interventions  New Patient Evaluation.        Kerry Kass Demichael Traum 01/31/2021, 1:49 AM

## 2021-01-18 NOTE — Consult Note (Signed)
[place] for Electrolyte Monitoring and Replacement   Recent Labs: Potassium (mmol/L)  Date Value  01/04/2021 3.1 (L)  11/04/2013 4.0   Magnesium (mg/dL)  Date Value  01/10/2021 1.6 (L)   Calcium (mg/dL)  Date Value  01/15/2021 7.8 (L)   Calcium, Total (mg/dL)  Date Value  11/04/2013 9.2   Albumin (g/dL)  Date Value  01/12/2021 2.5 (L)  11/04/2013 3.6   Phosphorus (mg/dL)  Date Value  01/25/2021 3.4   Sodium (mmol/L)  Date Value  01/16/2021 134 (L)  11/04/2013 134 (L)   Assessment: 88 year old female with a past medical history of OSA on CPAP, history of stroke, and hypertension BIB EMS after having difficulty breathing at home after eating. CTA chest concerning for bilateral aspiration pneumonia. Patient is currently intubated and sedated. Pharmacy is asked to follow and replace electrolytes while in the CCU.    MIVF: Isotonic bicarbonate gtt 100cc/hr  Potassium Recheck: 3.1 from 2.3 after IV potassium 10 mEq x 4  Goal of Therapy:  Electrolytes within normal limits  Plan:  -- Will give additional potassium chloride 10 mEq IV x 4 -- Recheck electrolytes with AM labs tomorrow  Owens Loffler ,PharmD Candidate 01/16/2021 11:23 AM

## 2021-01-18 NOTE — Progress Notes (Incomplete)
*  PRELIMINARY RESULTS* Echocardiogram 2D Echocardiogram has been performed.  Lori Wiggins 01/21/2021, 8:45 AM

## 2021-01-18 NOTE — Progress Notes (Signed)
Initial Nutrition Assessment  DOCUMENTATION CODES:   Not applicable  INTERVENTION:   RD will monitor for plan of care  Recommend consideration of TPN if unable to gain enteral assess over the next 24 hrs.  Pt at moderate refeed risk  NUTRITION DIAGNOSIS:   Inadequate oral intake related to altered GI function (esophageal impaction) as evidenced by NPO status.  GOAL:   Provide needs based on ASPEN/SCCM guidelines  MONITOR:   Vent status, Labs, Weight trends, Skin, I & O's  REASON FOR ASSESSMENT:   Ventilator    ASSESSMENT:   88 y/o female with h/o HTN, breast cancer s/p lumpectomy and XRT, GERD and OSA who is admitted with esophageal impaction and aspiration PNA.  Pt sedated and ventilated. OGT unable to be advanced into the stomach and has been removed. Pt with esophageal impaction from food. KUB reports dilated esophagus in the setting of likely achalasia or stricture at the gastroesophageal junction. GI consult is pending. Per chart, pt is down 14lbs(11%) since May; RD unsure how recently weight loss occurred. RD will monitor for plan of care. May need to consider TPN if unable to gain enteral assess by tomorrow.   Medications reviewed and include: heparin, insulin, solu-medrol, protonix, unasyn, neo-synephrine, Kcl, Na bicarbonate, vasopressin  Labs reviewed: Na 134(L), K 3.1(L), P 3.4 wnl, Mg 1.6(L) Wbc- 13.1(H) Cbgs- 162, 193, 108, 135 x 24 hrs  Patient is currently intubated on ventilator support MV: 15.2 L/min Temp (24hrs), Avg:97.8 F (36.6 C), Min:96.4 F (35.8 C), Max:98.8 F (37.1 C)  Propofol: none   MAP- >13mmHg   UOP- 542ml   NUTRITION - FOCUSED PHYSICAL EXAM:  Flowsheet Row Most Recent Value  Orbital Region No depletion  Upper Arm Region No depletion  Thoracic and Lumbar Region No depletion  Buccal Region No depletion  Temple Region No depletion  Clavicle Bone Region Mild depletion  Clavicle and Acromion Bone Region Mild depletion   Scapular Bone Region No depletion  Dorsal Hand No depletion  Patellar Region No depletion  Anterior Thigh Region No depletion  Posterior Calf Region No depletion  Edema (RD Assessment) None  Hair Reviewed  Eyes Reviewed  Mouth Reviewed  Skin Reviewed  Nails Reviewed   Diet Order:   Diet Order             Diet NPO time specified  Diet effective now                  EDUCATION NEEDS:   No education needs have been identified at this time  Skin:  Skin Assessment: Reviewed RN Assessment  Last BM:  PTA  Height:   Ht Readings from Last 1 Encounters:  01/12/2021 5' 2.01" (1.575 m)    Weight:   Wt Readings from Last 1 Encounters:  01/24/2021 49.9 kg    Ideal Body Weight:  50 kg  BMI:  Body mass index is 20.12 kg/m.  Estimated Nutritional Needs:   Kcal:  1299kcal/day  Protein:  70-80g/day  Fluid:  1.3-1.5L/day  Koleen Distance MS, RD, LDN Please refer to [facility] for RD and/or RD on-call/weekend/after hours pager

## 2021-01-18 NOTE — Progress Notes (Signed)
Chaplain paged for family. Chaplain offered prayer emotional support, prayer shawl.

## 2021-01-19 ENCOUNTER — Encounter: Payer: Self-pay | Admitting: Internal Medicine

## 2021-01-19 LAB — CBC
HCT: 37.3 % (ref 36.0–46.0)
Hemoglobin: 12.3 g/dL (ref 12.0–15.0)
MCH: 29.1 pg (ref 26.0–34.0)
MCHC: 33 g/dL (ref 30.0–36.0)
MCV: 88.2 fL (ref 80.0–100.0)
Platelets: 160 10*3/uL (ref 150–400)
RBC: 4.23 MIL/uL (ref 3.87–5.11)
RDW: 15.2 % (ref 11.5–15.5)
WBC: 27.3 10*3/uL — ABNORMAL HIGH (ref 4.0–10.5)
nRBC: 0 % (ref 0.0–0.2)

## 2021-01-19 LAB — BLOOD GAS, ARTERIAL
Acid-Base Excess: 0.6 mmol/L (ref 0.0–2.0)
Bicarbonate: 29.6 mmol/L — ABNORMAL HIGH (ref 20.0–28.0)
FIO2: 1
O2 Saturation: 91.8 %
PEEP: 5 cmH2O
Patient temperature: 37
Pressure control: 30 cmH2O
pCO2 arterial: 69 mmHg (ref 32.0–48.0)
pH, Arterial: 7.24 — ABNORMAL LOW (ref 7.350–7.450)
pO2, Arterial: 74 mmHg — ABNORMAL LOW (ref 83.0–108.0)

## 2021-01-19 LAB — MAGNESIUM: Magnesium: 2.3 mg/dL (ref 1.7–2.4)

## 2021-01-19 LAB — BASIC METABOLIC PANEL
Anion gap: 12 (ref 5–15)
BUN: 25 mg/dL — ABNORMAL HIGH (ref 8–23)
CO2: 29 mmol/L (ref 22–32)
Calcium: 7.1 mg/dL — ABNORMAL LOW (ref 8.9–10.3)
Chloride: 95 mmol/L — ABNORMAL LOW (ref 98–111)
Creatinine, Ser: 1.27 mg/dL — ABNORMAL HIGH (ref 0.44–1.00)
GFR, Estimated: 40 mL/min — ABNORMAL LOW (ref >60)
Glucose, Bld: 91 mg/dL (ref 70–99)
Potassium: 4.1 mmol/L (ref 3.5–5.1)
Sodium: 136 mmol/L (ref 135–145)

## 2021-01-19 LAB — GLUCOSE, CAPILLARY
Glucose-Capillary: 75 mg/dL (ref 70–99)
Glucose-Capillary: 83 mg/dL (ref 70–99)
Glucose-Capillary: 88 mg/dL (ref 70–99)

## 2021-01-19 LAB — HEMOGLOBIN A1C
Hgb A1c MFr Bld: 6.1 % — ABNORMAL HIGH (ref 4.8–5.6)
Mean Plasma Glucose: 128.37 mg/dL

## 2021-01-19 LAB — PROCALCITONIN: Procalcitonin: 75.67 ng/mL

## 2021-01-19 LAB — PHOSPHORUS: Phosphorus: 5.9 mg/dL — ABNORMAL HIGH (ref 2.5–4.6)

## 2021-01-19 LAB — DIGOXIN LEVEL: Digoxin Level: 1.7 ng/mL (ref 0.8–2.0)

## 2021-01-19 MED ORDER — SODIUM BICARBONATE 8.4 % IV SOLN
INTRAVENOUS | Status: DC
  Filled 2021-01-19 (×2): qty 1000

## 2021-01-19 MED ORDER — SODIUM BICARBONATE 8.4 % IV SOLN: 150.0000 meq | Freq: Once | INTRAVENOUS | Status: DC

## 2021-01-19 MED ORDER — EPINEPHRINE 1 MG/10ML IJ SOSY
PREFILLED_SYRINGE | INTRAMUSCULAR | Status: AC
  Administered 2021-01-19: 0.5 mg via INTRAVENOUS
  Filled 2021-01-19: qty 10

## 2021-01-19 MED ORDER — EPINEPHRINE 1 MG/10ML IJ SOSY: 0.5000 mg | PREFILLED_SYRINGE | Freq: Once | INTRAMUSCULAR | Status: AC

## 2021-01-20 ENCOUNTER — Telehealth: Payer: Self-pay | Admitting: Pulmonary Disease

## 2021-01-20 NOTE — Telephone Encounter (Signed)
Noted.  Will close encounter.  

## 2021-01-22 LAB — CULTURE, BLOOD (ROUTINE X 2)
Culture: NO GROWTH
Culture: NO GROWTH
Special Requests: ADEQUATE
Special Requests: ADEQUATE

## 2021-02-01 NOTE — Progress Notes (Signed)
Ch asked to meet Pt's daughter and s-I-l in [place]. Daughter requested feedback re prayer. Ch advised starting with love, proceeding to peace and healing however that comes about.

## 2021-02-01 NOTE — Consult Note (Signed)
[place] for Electrolyte Monitoring and Replacement   Recent Labs: Potassium (mmol/L)  Date Value  21-Jan-2021 4.1  11/04/2013 4.0   Magnesium (mg/dL)  Date Value  01/21/2021 2.3   Calcium (mg/dL)  Date Value  01/21/2021 7.1 (L)   Calcium, Total (mg/dL)  Date Value  11/04/2013 9.2   Albumin (g/dL)  Date Value  01/10/2021 2.7 (L)  11/04/2013 3.6   Phosphorus (mg/dL)  Date Value  2021/01/21 5.9 (H)   Sodium (mmol/L)  Date Value  2021/01/21 136  11/04/2013 134 (L)   Assessment: 88 year old female with a past medical history of OSA on CPAP, history of stroke, and hypertension BIB EMS after having difficulty breathing at home after eating. CTA chest concerning for bilateral aspiration pneumonia. Patient is currently intubated and sedated. Pharmacy is asked to follow and replace electrolytes while in the CCU.   Goal of Therapy:  Electrolytes within normal limits  Plan:  -- Electrolytes within normal limits, no replacement required at this time -- Follow-up with AM labs tomorrow  Owens Loffler ,PharmD Candidate 01-21-21 7:19 AM

## 2021-02-01 NOTE — Discharge Summary (Signed)
Physician Discharge Summary  Patient ID: Lori Wiggins MRN: [id number] DOB/AGE: May 26, 1928 88 y.o.  Admit date: 01/01/2021 Discharge date: 01/20/2021  Admission Diagnoses:  Discharge Diagnoses:  Principal Problem:   Acute respiratory failure [facility])   Discharged Condition: deceased  Hospital Course:  Lori Wiggins is a 88 year old woman who presented to the ER via EMS after having difficulty breathing at home after eating dinner. She complained of indigestion and had episode of vomiting. Her SpO2 was in the 80s upon EMS arrival to her apartment and she was placed on CPAP for transport to the hospital.    She was initially tolerating Bipap in the ER and went for CTA of the chest and developed increasing respiratory distress concerning for stridor. She was intubated in the ER but experienced difficulty due to food matter within her upper airway.    CTA Chest shows significantly dilated esophagus throughout it's entirety with stasis of large amount of ingested food. OG tube placement was attempted in the ER but difficult due to the anatomy and food content. There is also concern for aspiration pneumonia as bilateral lower lobe opacities were noted   PCCM was called for admission due to respiratory failure requiring mechanical ventilation.   The patient developed bilateral infiltrates and hemodynamic instability which was recalcitrant to ongoing treatment. She eventually became hypoxic despite APRV advanced ventilation and multiple pressors. She became hypotensive and passed with her family at the bedside overseeing her comfort and DNR status.     Consults: GI   Allergies as of 2021-01-20       Reactions   Sulfa Antibiotics Other (See Comments)   Unknown Unknown        Medication List     ASK your doctor about these medications    amLODipine 2.5 MG tablet Commonly known as: NORVASC Take 2.5 mg by mouth 2 (two) times daily.   aspirin EC 81 MG tablet Take 81 mg by mouth  daily.   azelastine 0.1 % nasal spray Commonly known as: ASTELIN   BLUE-EMU MAXIMUM STRENGTH EX Apply 1 application topically 2 (two) times daily as needed (pain).   COQ10 PO Take 1 capsule by mouth daily.   fluticasone 50 MCG/ACT nasal spray Commonly known as: FLONASE   gentamicin ointment 0.1 % Commonly known as: GARAMYCIN Apply 1 application topically 2 (two) times daily. Apply to bottom of nose for CPAP mask   hydrochlorothiazide 12.5 MG capsule Commonly known as: MICROZIDE Take 12.5 mg by mouth daily.   JOINT HEALTH PO Take 1 capsule by mouth daily. Peak Mobility   levofloxacin 750 MG tablet Commonly known as: LEVAQUIN Take 750 mg by mouth daily.   Linzess 72 MCG capsule Generic drug: linaclotide Take 72 mcg by mouth every morning.   losartan 25 MG tablet Commonly known as: COZAAR Take 25 mg by mouth daily.   MAGNESIUM-ZINC PO Take 1 tablet by mouth daily.   meclizine 25 MG tablet Commonly known as: ANTIVERT Take 25 mg by mouth 2 (two) times daily as needed.   metoprolol succinate 25 MG 24 hr tablet Commonly known as: TOPROL-XL Take 25 mg by mouth daily.   omeprazole 20 MG capsule Commonly known as: PRILOSEC Take 20 mg by mouth daily.   predniSONE 10 MG tablet Commonly known as: DELTASONE Take 40 mg by mouth daily.   Prevagen 10 MG Caps Generic drug: Apoaequorin Take by mouth.   VIACTIV CALCIUM PLUS D PO Take 1 tablet by mouth daily.   vitamin B-12  100 MCG tablet Commonly known as: CYANOCOBALAMIN Take 100 mcg by mouth daily.   VITAMIN D3 PO Take 1 tablet by mouth daily.         Signed: Nelle Don 01/20/2021, 6:21 PM

## 2021-02-01 NOTE — Progress Notes (Signed)
Pt passed away on ventilator.

## 2021-02-01 NOTE — Progress Notes (Signed)
°   Jan 25, 2021 1245  Clinical Encounter Type  Visited With Patient and family together  Visit Type Follow-up;Spiritual support;Death  Stress Factors  Family Stress Factors Loss;Exhausted   Chaplain provided support to family during crucial time of making decisions and the death of their loved one. Emotional, spiritual support given through compassionate presence, relfective listening and prayer.

## 2021-02-01 DEATH — deceased

## 2021-03-15 ENCOUNTER — Ambulatory Visit: Payer: PPO | Admitting: Podiatry
